# Patient Record
Sex: Female | Born: 1945
Health system: Southern US, Community
[De-identification: ages and names within clinical notes are randomized; demographics above are authoritative.]

## PROBLEM LIST (undated history)

## (undated) DIAGNOSIS — N182 Chronic kidney disease, stage 2 (mild): Secondary | ICD-10-CM

## (undated) DIAGNOSIS — I509 Heart failure, unspecified: Secondary | ICD-10-CM

## (undated) DIAGNOSIS — M810 Age-related osteoporosis without current pathological fracture: Secondary | ICD-10-CM

## (undated) DIAGNOSIS — E785 Hyperlipidemia, unspecified: Secondary | ICD-10-CM

## (undated) DIAGNOSIS — M1990 Unspecified osteoarthritis, unspecified site: Secondary | ICD-10-CM

## (undated) DIAGNOSIS — R809 Proteinuria, unspecified: Secondary | ICD-10-CM

## (undated) DIAGNOSIS — H269 Unspecified cataract: Secondary | ICD-10-CM

## (undated) DIAGNOSIS — I10 Essential (primary) hypertension: Secondary | ICD-10-CM

## (undated) DIAGNOSIS — E119 Type 2 diabetes mellitus without complications: Secondary | ICD-10-CM

## (undated) HISTORY — DX: Essential (primary) hypertension: I10

## (undated) HISTORY — DX: Unspecified cataract: H26.9

## (undated) HISTORY — DX: Heart failure, unspecified: I50.9

## (undated) HISTORY — DX: Hyperlipidemia, unspecified: E78.5

## (undated) HISTORY — DX: Morbid (severe) obesity due to excess calories: E66.01

## (undated) HISTORY — PX: TONSILLECTOMY: SUR1361

## (undated) HISTORY — DX: Unspecified osteoarthritis, unspecified site: M19.90

## (undated) HISTORY — DX: Age-related osteoporosis without current pathological fracture: M81.0

## (undated) HISTORY — DX: Proteinuria, unspecified: R80.9

## (undated) HISTORY — DX: Chronic kidney disease, stage 2 (mild): N18.2

## (undated) HISTORY — DX: Type 2 diabetes mellitus without complications: E11.9

---

## 2005-03-12 ENCOUNTER — Ambulatory Visit: Payer: Self-pay | Admitting: Family Medicine

## 2005-10-05 ENCOUNTER — Ambulatory Visit: Payer: Self-pay | Admitting: Family Medicine

## 2005-10-15 HISTORY — PX: DILATION AND CURETTAGE OF UTERUS: SHX78

## 2005-10-15 HISTORY — PX: MOUTH SURGERY: SHX715

## 2005-11-08 ENCOUNTER — Ambulatory Visit: Payer: Self-pay | Admitting: Obstetrics and Gynecology

## 2013-05-28 ENCOUNTER — Ambulatory Visit: Payer: Self-pay | Admitting: Family Medicine

## 2013-06-03 ENCOUNTER — Ambulatory Visit: Payer: Self-pay | Admitting: Family Medicine

## 2013-10-05 ENCOUNTER — Ambulatory Visit: Payer: Self-pay | Admitting: Family Medicine

## 2013-10-15 ENCOUNTER — Ambulatory Visit: Payer: Self-pay | Admitting: Family Medicine

## 2013-10-15 DIAGNOSIS — R809 Proteinuria, unspecified: Secondary | ICD-10-CM

## 2013-10-15 HISTORY — DX: Proteinuria, unspecified: R80.9

## 2013-11-15 ENCOUNTER — Ambulatory Visit: Payer: Self-pay | Admitting: Family Medicine

## 2013-12-13 ENCOUNTER — Ambulatory Visit: Payer: Self-pay | Admitting: Family Medicine

## 2013-12-13 DIAGNOSIS — M810 Age-related osteoporosis without current pathological fracture: Secondary | ICD-10-CM

## 2013-12-13 HISTORY — DX: Age-related osteoporosis without current pathological fracture: M81.0

## 2013-12-25 ENCOUNTER — Ambulatory Visit: Payer: Self-pay | Admitting: Family Medicine

## 2014-01-13 ENCOUNTER — Ambulatory Visit: Payer: Self-pay | Admitting: Family Medicine

## 2014-01-21 ENCOUNTER — Ambulatory Visit: Payer: Self-pay | Admitting: Family Medicine

## 2014-06-11 ENCOUNTER — Other Ambulatory Visit: Payer: Self-pay | Admitting: *Deleted

## 2014-06-11 ENCOUNTER — Ambulatory Visit: Payer: Self-pay

## 2014-06-11 ENCOUNTER — Encounter: Payer: Self-pay | Admitting: Podiatry

## 2014-06-11 ENCOUNTER — Ambulatory Visit (INDEPENDENT_AMBULATORY_CARE_PROVIDER_SITE_OTHER): Payer: Medicare Other | Admitting: Podiatry

## 2014-06-11 ENCOUNTER — Ambulatory Visit (INDEPENDENT_AMBULATORY_CARE_PROVIDER_SITE_OTHER): Payer: Medicare Other

## 2014-06-11 VITALS — BP 141/80 | HR 81 | Resp 16 | Ht 64.0 in | Wt 285.0 lb

## 2014-06-11 DIAGNOSIS — Q6650 Congenital pes planus, unspecified foot: Secondary | ICD-10-CM

## 2014-06-11 DIAGNOSIS — M722 Plantar fascial fibromatosis: Secondary | ICD-10-CM

## 2014-06-11 DIAGNOSIS — M7750 Other enthesopathy of unspecified foot: Secondary | ICD-10-CM

## 2014-06-11 DIAGNOSIS — Q6651 Congenital pes planus, right foot: Secondary | ICD-10-CM

## 2014-06-11 MED ORDER — TRIAMCINOLONE ACETONIDE 10 MG/ML IJ SUSP
10.0000 mg | Freq: Once | INTRAMUSCULAR | Status: AC
  Administered 2014-06-11: 10 mg

## 2014-06-11 NOTE — Patient Instructions (Signed)

## 2014-06-11 NOTE — Progress Notes (Signed)
   Subjective:    Patient ID: Julie [name], female    DOB: 1946/02/03, 68 y.o.   MRN: [id number]  HPI Comments: Been having right foot pain. It is real sore and tender. Right arch to heel pain . It has been about a month . I am diabetic and the last a1c was a 9 but it is coming down.  Foot Pain      Review of Systems  HENT:       Ringing in ears   Endocrine:       Diabetes   All other systems reviewed and are negative.      Objective:   Physical Exam        Assessment & Plan:

## 2014-06-11 NOTE — Progress Notes (Signed)
Subjective:     Patient ID: Julie [name], female   DOB: 1946/09/29, 68 y.o.   MRN: [id number]  Foot Pain   patient presents stating I'm having a lot of pain on the inside of my right foot with the pain seemed to be in the heel and also on the inside of the foot. Patient is quite obese and is having a lot of pain also in the hip and knees and it does run a relatively high A1c of between 8 and 9   Review of Systems  All other systems reviewed and are negative.      Objective:   Physical Exam  Nursing note and vitals reviewed. Constitutional: She is oriented to person, place, and time.  Cardiovascular: Intact distal pulses.   Musculoskeletal: Normal range of motion.  Neurological: She is oriented to person, place, and time.  Skin: Skin is warm and dry.   neurovascular status is found to be intact with muscle strength adequate and range of motion of the subtalar and midtarsal joint within normal limits. Patient is noted to have moderate varicosities in the ankle region she does have good digital perfusion and is well oriented x3. I noted discomfort that is mostly at the insertion of the posterior tibial tendon into the navicular with moderate plantar fascially pain in the arch itself    Assessment:     Probable posterior tibial tendinitis right with mild plantar fasciitis also noted    Plan:     Patient is noted to have normal H&P and x-rays were reviewed. I carefully injected the insertion of posterior tibial tendinitis into the navicular with 3 mg Kenalog 5 mg Xylocaine and applied fascially brace with instructions on usage. Reappoint in 1 week for consideration of orthotics

## 2014-06-18 ENCOUNTER — Ambulatory Visit (INDEPENDENT_AMBULATORY_CARE_PROVIDER_SITE_OTHER): Payer: Medicare Other | Admitting: Podiatry

## 2014-06-18 VITALS — BP 148/83 | HR 85 | Resp 16

## 2014-06-18 DIAGNOSIS — M7750 Other enthesopathy of unspecified foot: Secondary | ICD-10-CM

## 2014-06-18 MED ORDER — TRIAMCINOLONE ACETONIDE 10 MG/ML IJ SUSP: 10.0000 mg | Freq: Once | INTRAMUSCULAR | Status: DC

## 2014-06-18 NOTE — Progress Notes (Signed)
Subjective:     Patient ID: Julie [name], female   DOB: Sep 26, 1946, 68 y.o.   MRN: [id number]  HPI patient states that my heel is improved but still hurts in this one area   Review of Systems     Objective:   Physical Exam Neurovascular status intact with continued discomfort in the medial fascially band at the insertion of the tendon into the calcaneus right foot with improvement upon palpation but still tender    Assessment:     Improve plantar fasciitis with pain still noted upon deep palpation    Plan:     Today I reinjected the plantar fascia 3 mg Kenalog 5 mg Xylocaine and instructed on continued night splint usage

## 2014-08-02 ENCOUNTER — Ambulatory Visit: Payer: Self-pay | Admitting: Family Medicine

## 2014-09-03 ENCOUNTER — Ambulatory Visit (INDEPENDENT_AMBULATORY_CARE_PROVIDER_SITE_OTHER): Payer: Medicare Other | Admitting: Podiatry

## 2014-09-03 DIAGNOSIS — M722 Plantar fascial fibromatosis: Secondary | ICD-10-CM

## 2014-09-03 DIAGNOSIS — L600 Ingrowing nail: Secondary | ICD-10-CM

## 2014-09-03 NOTE — Patient Instructions (Signed)

## 2014-09-04 NOTE — Progress Notes (Signed)
Subjective:     Patient ID: Julie [name], female   DOB: May 09, 1946, 68 y.o.   MRN: [id number]  HPI patient presents with a painful right hallux nail with damaged and thickened and also still has some symptoms with the plantar fascia that becomes discomforting with ambulation   Review of Systems  All other systems reviewed and are negative.      Objective:   Physical Exam  Constitutional: She is oriented to person, place, and time.  Cardiovascular: Intact distal pulses.   Musculoskeletal: Normal range of motion.  Neurological: She is oriented to person, place, and time.  Skin: Skin is warm.  Nursing note and vitals reviewed.  neurovascular status found to be intact with muscle strength adequate and range of motion of the subtalar and midtarsal joint within normal limits. Patient is noted to have a damage right hallux nail that is thick loose and painful when pressed from a dorsal direction and has moderate discomfort in the plantar fascia of both feet     Assessment:     Damage right hallux nail with pain and deformity of several years duration and plantar fasciitis    Plan:     Reviewed both conditions and discussed physical therapy for the plantar fascia and I wrote out sheets to discuss different types of exercises to do. The nail I recommended removal and I explained permanent procedure to remove the nailbed and explained the risk of the procedure. Patient wants the surgery and today I infiltrated 60 mg Xylocaine Marcaine mixture remove the hallux nail exposed the matrix and applied phenol 3 applications 30 seconds followed by alcohol lavaged and sterile dressings. Gave instructions on soaks and reappoint

## 2015-03-31 ENCOUNTER — Other Ambulatory Visit: Payer: Self-pay | Admitting: Family Medicine

## 2015-04-01 ENCOUNTER — Other Ambulatory Visit: Payer: Self-pay

## 2015-04-01 ENCOUNTER — Telehealth: Payer: Self-pay | Admitting: Family Medicine

## 2015-04-01 MED ORDER — CANAGLIFLOZIN 100 MG PO TABS: 100.0000 mg | ORAL_TABLET | Freq: Every day | ORAL | Status: DC

## 2015-04-01 MED ORDER — LISINOPRIL 40 MG PO TABS: 40.0000 mg | ORAL_TABLET | Freq: Every day | ORAL | Status: DC

## 2015-04-01 NOTE — Telephone Encounter (Signed)
Need 90 day supply for mail order.

## 2015-04-01 NOTE — Telephone Encounter (Signed)
E-Fax came through for refill: Rx: Invokana & Lisinopril Rx fax in basket next to me

## 2015-04-05 ENCOUNTER — Other Ambulatory Visit: Payer: Self-pay | Admitting: Family Medicine

## 2015-04-05 NOTE — Telephone Encounter (Signed)
Routing to provider. Needs 90 day supply for mail order.

## 2015-04-05 NOTE — Telephone Encounter (Signed)
E-Fax came in for refill: Rx: amLODipine (NORVASC) 10 MG tablet Rx copy in basket

## 2015-04-06 MED ORDER — AMLODIPINE BESYLATE 10 MG PO TABS: 10.0000 mg | ORAL_TABLET | Freq: Every day | ORAL | Status: DC

## 2015-04-12 DIAGNOSIS — E785 Hyperlipidemia, unspecified: Secondary | ICD-10-CM | POA: Insufficient documentation

## 2015-04-12 DIAGNOSIS — M810 Age-related osteoporosis without current pathological fracture: Secondary | ICD-10-CM | POA: Insufficient documentation

## 2015-04-12 DIAGNOSIS — N182 Chronic kidney disease, stage 2 (mild): Secondary | ICD-10-CM | POA: Insufficient documentation

## 2015-04-12 DIAGNOSIS — E113599 Type 2 diabetes mellitus with proliferative diabetic retinopathy without macular edema, unspecified eye: Secondary | ICD-10-CM | POA: Insufficient documentation

## 2015-04-12 DIAGNOSIS — I10 Essential (primary) hypertension: Secondary | ICD-10-CM | POA: Insufficient documentation

## 2015-04-19 ENCOUNTER — Ambulatory Visit: Payer: Self-pay | Admitting: Family Medicine

## 2015-04-27 ENCOUNTER — Ambulatory Visit (INDEPENDENT_AMBULATORY_CARE_PROVIDER_SITE_OTHER): Payer: Medicare Other | Admitting: Family Medicine

## 2015-04-27 ENCOUNTER — Encounter: Payer: Self-pay | Admitting: Family Medicine

## 2015-04-27 VITALS — BP 130/75 | HR 90 | Temp 98.2°F | Ht 64.0 in | Wt 283.0 lb

## 2015-04-27 DIAGNOSIS — I10 Essential (primary) hypertension: Secondary | ICD-10-CM

## 2015-04-27 DIAGNOSIS — E119 Type 2 diabetes mellitus without complications: Secondary | ICD-10-CM

## 2015-04-27 DIAGNOSIS — N182 Chronic kidney disease, stage 2 (mild): Secondary | ICD-10-CM

## 2015-04-27 DIAGNOSIS — E785 Hyperlipidemia, unspecified: Secondary | ICD-10-CM

## 2015-04-27 DIAGNOSIS — R809 Proteinuria, unspecified: Secondary | ICD-10-CM

## 2015-04-27 DIAGNOSIS — R143 Flatulence: Secondary | ICD-10-CM | POA: Diagnosis not present

## 2015-04-27 DIAGNOSIS — Z5181 Encounter for therapeutic drug level monitoring: Secondary | ICD-10-CM

## 2015-04-27 MED ORDER — ATORVASTATIN CALCIUM 10 MG PO TABS: 10.0000 mg | ORAL_TABLET | Freq: Every day | ORAL | Status: DC

## 2015-04-27 NOTE — Assessment & Plan Note (Signed)
Well controlled. -continue current meds  

## 2015-04-27 NOTE — Assessment & Plan Note (Signed)
Increase statin to daily but go back down and call me if myalgias; limit eggs and saturated fats

## 2015-04-27 NOTE — Progress Notes (Signed)
BP 130/75 mmHg  Pulse 90  Temp(Src) 98.2 F (36.8 C)  Ht 5\' 4"  (1.626 m)  Wt 283 lb (128.368 kg)  BMI 48.55 kg/m2  SpO2 93%   Subjective:    Patient ID: Julie [name], female    DOB: 12-Dec-1945, 69 y.o.   MRN: [id number]  HPI: Julie [name] is a 69 y.o. female  Chief Complaint  Patient presents with  . Diabetes  . Hyperlipidemia  . Hypertension   Diabetes mellitus type 2 Patient has had diabetes for year Taking the metformin; did not get the other medicine because it was $400 She does not eat like she should Checking sugars?  yes How often? At night, 2-3 hours after meal Range (low to high) over last two weeks:  180-204 Feels diabetes is under fair control Does patient feel additional teaching/training would be helpful?  no, has gone to diabetic teaching Trying to limit white bread, white rice, white potatoes, sweets?  yes but does have occasional cake, occasional sweets Trying to limit sweetened drinks like iced tea, soft drinks, sports drinks, fruit juices?  yes; uses splenda Exercise/activity level:  sedentary (essential no activity) Checking feet every day/night?  yes Last eye exam:  A few weeks ago Diabetes-associated complications:  retinopathy; but then patient said the eye doctor said the hemorrhages are not from her diabetes, so we'll check those notes  High cholesterol Has been taking fish oil, taking four days per week right now but is okay with going to every day after this Patient has had high cholesterol for many years Dietary intake: tomato with breakfast instead of hash browns Do you eat more than 3 eggs per week  no Do you try to limit saturated fats in your diet?  yes Exercise: Exercise/activity level:  sedentary (essential NO activity) Does high cholesterol run in your family?   no Weight:  If overweight or obese, are you trying to lose weight?  highest weight was 337 pounds; goes to [employer], work on it, encourage no quack diets  High blood  pressure; normal today  Relevant past medical, surgical, family and social history reviewed and updated as indicated. Interim medical history since our last visit reviewed. Allergies and medications reviewed and updated.  Review of Systems Per HPI unless specifically indicated above     Objective:    BP 130/75 mmHg  Pulse 90  Temp(Src) 98.2 F (36.8 C)  Ht 5\' 4"  (1.626 m)  Wt 283 lb (128.368 kg)  BMI 48.55 kg/m2  SpO2 93%  Wt Readings from Last 3 Encounters:  04/27/15 283 lb (128.368 kg)  01/12/15 283 lb (128.368 kg)  06/11/14 285 lb (129.275 kg)    Physical Exam  Constitutional: She appears well-developed and well-nourished. No distress.  Morbidly obese  HENT:  Head: Normocephalic and atraumatic.  Eyes: EOM are normal. No scleral icterus.  Neck: No thyromegaly present.  No carotid bruits  Cardiovascular: Normal rate, regular rhythm and normal heart sounds.   No murmur heard. Pulmonary/Chest: Effort normal and breath sounds normal. No respiratory distress. She has no wheezes.  Abdominal: Soft. Bowel sounds are normal. She exhibits no distension. There is no tenderness. There is no guarding.  Musculoskeletal: Normal range of motion. She exhibits no edema.  Neurological: She is alert. She exhibits normal muscle tone.  Skin: Skin is warm and dry. She is not diaphoretic. No pallor.  Psychiatric: She has a normal mood and affect. Her behavior is normal. Judgment and thought content normal.  Diabetic Foot Form - Detailed   Diabetic Foot Exam - detailed  Diabetic Foot exam was performed with the following findings:  Yes 04/27/2015 11:03 AM  Visual Foot Exam completed.:  Yes  Is there a history of foot ulcer?:  No  Can the patient see the bottom of their feet?:  Yes  Are the toenails thick?:  Yes  Are the toenails ingrown?:  No    Pulse Foot Exam completed.:  Yes  Right Dorsalis Pedis:  Present Left Dorsalis Pedis:  Present  Sensory Foot Exam Completed.:  Yes  Swelling:   No  Semmes-Weinstein Monofilament Test  R Site 1-Great Toe:  Pos L Site 1-Great Toe:  Pos  R Site 4:  Pos L Site 4:  Pos    Comments:  Intact to monofilament and vibration testing; skin dry at the heels; some toenail fungus      No results found for this or any previous visit.    Assessment & Plan:   Problem List Items Addressed This Visit      Cardiovascular and Mediastinum   Hypertension    Well-controlled; continue current meds      Relevant Medications   atorvastatin (LIPITOR) 10 MG tablet     Endocrine   Diabetes mellitus without complication - Primary    Check A1c today; tradjenta was too expensive; continue other med; limit sweets      Relevant Medications   atorvastatin (LIPITOR) 10 MG tablet   Other Relevant Orders   Hgb A1c w/o eAG   Microalbumin / creatinine urine ratio     Genitourinary   CKD (chronic kidney disease) stage 2, GFR 60-89 ml/min    Monitor creatinine and GFR today      Relevant Orders   Microalbumin / creatinine urine ratio     Other   Hyperlipidemia    Increase statin to daily but go back down and call me if myalgias; limit eggs and saturated fats      Relevant Medications   atorvastatin (LIPITOR) 10 MG tablet   Other Relevant Orders   Lipid Panel w/o Chol/HDL Ratio   Proteinuria    Has seen Dr. Holley Raring twice; will check urine and creatinine today; avoid NSAIDs      Relevant Orders   Microalbumin / creatinine urine ratio    Other Visit Diagnoses    Flatulence        she has never tried Gas-x; limit beans, bagels, broccoli, cabbage, etc.    Medication monitoring encounter        Relevant Orders    Comprehensive metabolic panel       An After Visit Summary was printed and given to the patient. Orders Placed This Encounter  Procedures  . Comprehensive metabolic panel    Order Specific Question:  Has the patient fasted?    Answer:  Yes  . Hgb A1c w/o eAG  . Lipid Panel w/o Chol/HDL Ratio    Order Specific Question:  Has  the patient fasted?    Answer:  Yes  . Microalbumin / creatinine urine ratio   Medications Discontinued During This Encounter  Medication Reason  . linagliptin (TRADJENTA) 5 MG TABS tablet Not covered by the pt's insurance  . atorvastatin (LIPITOR) 10 MG tablet Reorder    Follow up plan: Return in about 3 months (around 07/28/2015) for diabetes, high cholesterol.

## 2015-04-27 NOTE — Assessment & Plan Note (Signed)
Has seen Dr. Holley Raring twice; will check urine and creatinine today; avoid NSAIDs

## 2015-04-27 NOTE — Assessment & Plan Note (Signed)
Monitor creatinine and GFR today

## 2015-04-27 NOTE — Patient Instructions (Addendum)
Limit beans, bagels, broccoli, bread, cabbage Try Gas-X for symptoms Keep up efforts at weight loss Avoid NSAIDs Return in 3 months for follow-up

## 2015-04-27 NOTE — Assessment & Plan Note (Signed)
Check A1c today; tradjenta was too expensive; continue other med; limit sweets

## 2015-04-28 ENCOUNTER — Encounter: Payer: Self-pay | Admitting: Family Medicine

## 2015-04-28 LAB — COMPREHENSIVE METABOLIC PANEL
A/G RATIO: 1.6 (ref 1.1–2.5)
ALT: 15 IU/L (ref 0–32)
AST: 12 IU/L (ref 0–40)
Albumin: 3.9 g/dL (ref 3.6–4.8)
Alkaline Phosphatase: 86 IU/L (ref 39–117)
BUN/Creatinine Ratio: 22 (ref 11–26)
BUN: 19 mg/dL (ref 8–27)
Bilirubin Total: 0.4 mg/dL (ref 0.0–1.2)
CO2: 23 mmol/L (ref 18–29)
CREATININE: 0.85 mg/dL (ref 0.57–1.00)
Calcium: 9.4 mg/dL (ref 8.7–10.3)
Chloride: 102 mmol/L (ref 97–108)
GFR calc non Af Amer: 72 mL/min/{1.73_m2} (ref >59)
GFR, EST AFRICAN AMERICAN: 83 mL/min/{1.73_m2} (ref >59)
Globulin, Total: 2.4 g/dL (ref 1.5–4.5)
Glucose: 152 mg/dL — ABNORMAL HIGH (ref 65–99)
Potassium: 4.1 mmol/L (ref 3.5–5.2)
SODIUM: 142 mmol/L (ref 134–144)
Total Protein: 6.3 g/dL (ref 6.0–8.5)

## 2015-04-28 LAB — LIPID PANEL W/O CHOL/HDL RATIO
Cholesterol, Total: 156 mg/dL (ref 100–199)
HDL: 36 mg/dL — ABNORMAL LOW (ref >39)
LDL Calculated: 99 mg/dL (ref 0–99)
Triglycerides: 104 mg/dL (ref 0–149)
VLDL CHOLESTEROL CAL: 21 mg/dL (ref 5–40)

## 2015-04-28 LAB — HGB A1C W/O EAG: Hgb A1c MFr Bld: 8.3 % — ABNORMAL HIGH (ref 4.8–5.6)

## 2015-04-29 LAB — MICROALBUMIN / CREATININE URINE RATIO
Creatinine, Urine: 53.3 mg/dL
MICROALB/CREAT RATIO: 84.6 mg/g creat — ABNORMAL HIGH (ref 0.0–30.0)
Microalbumin, Urine: 45.1 ug/mL

## 2015-05-16 ENCOUNTER — Other Ambulatory Visit: Payer: Self-pay | Admitting: Family Medicine

## 2015-05-16 NOTE — Telephone Encounter (Signed)
Routing to provider  

## 2015-05-20 LAB — HM DIABETES EYE EXAM

## 2015-06-25 ENCOUNTER — Other Ambulatory Visit: Payer: Self-pay | Admitting: Family Medicine

## 2015-07-28 ENCOUNTER — Encounter: Payer: Self-pay | Admitting: Family Medicine

## 2015-07-28 ENCOUNTER — Ambulatory Visit (INDEPENDENT_AMBULATORY_CARE_PROVIDER_SITE_OTHER): Payer: Medicare Other | Admitting: Family Medicine

## 2015-07-28 VITALS — BP 133/87 | HR 82 | Temp 97.5°F | Ht 64.0 in | Wt 280.0 lb

## 2015-07-28 DIAGNOSIS — R809 Proteinuria, unspecified: Secondary | ICD-10-CM

## 2015-07-28 DIAGNOSIS — R011 Cardiac murmur, unspecified: Secondary | ICD-10-CM

## 2015-07-28 DIAGNOSIS — I10 Essential (primary) hypertension: Secondary | ICD-10-CM

## 2015-07-28 DIAGNOSIS — N182 Chronic kidney disease, stage 2 (mild): Secondary | ICD-10-CM

## 2015-07-28 DIAGNOSIS — E113513 Type 2 diabetes mellitus with proliferative diabetic retinopathy with macular edema, bilateral: Secondary | ICD-10-CM

## 2015-07-28 DIAGNOSIS — E785 Hyperlipidemia, unspecified: Secondary | ICD-10-CM | POA: Diagnosis not present

## 2015-07-28 DIAGNOSIS — Z5181 Encounter for therapeutic drug level monitoring: Secondary | ICD-10-CM

## 2015-07-28 DIAGNOSIS — Z23 Encounter for immunization: Secondary | ICD-10-CM

## 2015-07-28 NOTE — Patient Instructions (Addendum)
Try to use PLAIN allergy medicine without the decongestant Avoid: phenylephrine, phenylpropanolamine, and pseudoephredine Your goal blood pressure is less than 140 mmHg on top. Try to follow the DASH guidelines (DASH stands for Dietary Approaches to Stop Hypertension) Try to limit the sodium in your diet.  Ideally, consume less than 1.5 grams (less than 1,500mg ) per day. Do not add salt when cooking or at the table.  Check the sodium amount on labels when shopping, and choose items lower in sodium when given a choice. Avoid or limit foods that already contain a lot of sodium. Eat a diet rich in fruits and vegetables and whole grains. Check out the information at familydoctor.org entitled "What It Takes to Lose Weight" Try to lose between 1-2 pounds per week by taking in fewer calories and burning off more calories You can succeed by limiting portions, limiting foods dense in calories and fat, becoming more active, and drinking 8 glasses of water a day (64 ounces) Don't skip meals, especially breakfast, as skipping meals may alter your metabolism Do not use over-the-counter weight loss pills or gimmicks that claim rapid weight loss A healthy BMI (or body mass index) is between 18.5 and 24.9 You can calculate your ideal BMI at the [place] website ClubMonetize.fr To get out of the "morbid obesity" category, you will want to reach a weight of 232 pounds To get out of the "obesity" category, you will want to reach a weight of 174 pounds Try to do this gradually over the coming 12-18 months You received the flu shot today; it should protect you against the flu virus over the coming months; it will take about two weeks for antibodies to develop; do try to stay away from hospitals, nursing homes, and daycares during peak flu season; taking 1000 mg of vitamin C daily during flu season may help you avoid getting sick   DASH Eating Plan DASH stands for  "Dietary Approaches to Stop Hypertension." The DASH eating plan is a healthy eating plan that has been shown to reduce high blood pressure (hypertension). Additional health benefits may include reducing the risk of type 2 diabetes mellitus, heart disease, and stroke. The DASH eating plan may also help with weight loss. WHAT DO I NEED TO KNOW ABOUT THE DASH EATING PLAN? For the DASH eating plan, you will follow these general guidelines:  Choose foods with a percent daily value for sodium of less than 5% (as listed on the food label).  Use salt-free seasonings or herbs instead of table salt or sea salt.  Check with your health care provider or pharmacist before using salt substitutes.  Eat lower-sodium products, often labeled as "lower sodium" or "no salt added."  Eat fresh foods.  Eat more vegetables, fruits, and low-fat dairy products.  Choose whole grains. Look for the word "whole" as the first word in the ingredient list.  Choose fish and skinless chicken or [place] more often than red meat. Limit fish, poultry, and meat to 6 oz (170 g) each day.  Limit sweets, desserts, sugars, and sugary drinks.  Choose heart-healthy fats.  Limit cheese to 1 oz (28 g) per day.  Eat more home-cooked food and less restaurant, buffet, and fast food.  Limit fried foods.  Cook foods using methods other than frying.  Limit canned vegetables. If you do use them, rinse them well to decrease the sodium.  When eating at a restaurant, ask that your food be prepared with less salt, or no salt if possible. WHAT FOODS  CAN I EAT? Seek help from a dietitian for individual calorie needs. Grains Whole grain or whole wheat bread. Brown rice. Whole grain or whole wheat pasta. Quinoa, bulgur, and whole grain cereals. Low-sodium cereals. Corn or whole wheat flour tortillas. Whole grain cornbread. Whole grain crackers. Low-sodium crackers. Vegetables Fresh or frozen vegetables (raw, steamed, roasted, or grilled).  Low-sodium or reduced-sodium tomato and vegetable juices. Low-sodium or reduced-sodium tomato sauce and paste. Low-sodium or reduced-sodium canned vegetables.  Fruits All fresh, canned (in natural juice), or frozen fruits. Meat and Other Protein Products Ground beef (85% or leaner), grass-fed beef, or beef trimmed of fat. Skinless chicken or [place]. Ground chicken or [place]. Pork trimmed of fat. All fish and seafood. Eggs. Dried beans, peas, or lentils. Unsalted nuts and seeds. Unsalted canned beans. Dairy Low-fat dairy products, such as skim or 1% milk, 2% or reduced-fat cheeses, low-fat ricotta or cottage cheese, or plain low-fat yogurt. Low-sodium or reduced-sodium cheeses. Fats and Oils Tub margarines without trans fats. Light or reduced-fat mayonnaise and salad dressings (reduced sodium). Avocado. Safflower, olive, or canola oils. Natural peanut or almond butter. Other Unsalted popcorn and pretzels. The items listed above may not be a complete list of recommended foods or beverages. Contact your dietitian for more options. WHAT FOODS ARE NOT RECOMMENDED? Grains White bread. White pasta. White rice. Refined cornbread. Bagels and croissants. Crackers that contain trans fat. Vegetables Creamed or fried vegetables. Vegetables in a cheese sauce. Regular canned vegetables. Regular canned tomato sauce and paste. Regular tomato and vegetable juices. Fruits Dried fruits. Canned fruit in light or heavy syrup. Fruit juice. Meat and Other Protein Products Fatty cuts of meat. Ribs, chicken wings, bacon, sausage, bologna, salami, chitterlings, fatback, hot dogs, bratwurst, and packaged luncheon meats. Salted nuts and seeds. Canned beans with salt. Dairy Whole or 2% milk, cream, half-and-half, and cream cheese. Whole-fat or sweetened yogurt. Full-fat cheeses or blue cheese. Nondairy creamers and whipped toppings. Processed cheese, cheese spreads, or cheese curds. Condiments Onion and garlic salt,  seasoned salt, table salt, and sea salt. Canned and packaged gravies. Worcestershire sauce. Tartar sauce. Barbecue sauce. Teriyaki sauce. Soy sauce, including reduced sodium. Steak sauce. Fish sauce. Oyster sauce. Cocktail sauce. Horseradish. Ketchup and mustard. Meat flavorings and tenderizers. Bouillon cubes. Hot sauce. Tabasco sauce. Marinades. Taco seasonings. Relishes. Fats and Oils Butter, stick margarine, lard, shortening, ghee, and bacon fat. Coconut, palm kernel, or palm oils. Regular salad dressings. Other Pickles and olives. Salted popcorn and pretzels. The items listed above may not be a complete list of foods and beverages to avoid. Contact your dietitian for more information. WHERE CAN I FIND MORE INFORMATION? National Heart, Lung, and Blood Institute: travelstabloid.com   This information is not intended to replace advice given to you by your health care provider. Make sure you discuss any questions you have with your health care provider.   Document Released: 09/20/2011 Document Revised: 10/22/2014 Document Reviewed: 08/05/2013 Elsevier Interactive Patient Education [employer].

## 2015-07-28 NOTE — Assessment & Plan Note (Signed)
Keep working on [employer]; offered counseling in case there is emotional eating; she declined; I explained that losing significant weight is the single most important thing she can do for her health; she understands

## 2015-07-28 NOTE — Assessment & Plan Note (Signed)
Limit saturated fats; get more fiber

## 2015-07-28 NOTE — Assessment & Plan Note (Signed)
Seen by cardiologist, Dr. Ubaldo Glassing

## 2015-07-28 NOTE — Assessment & Plan Note (Signed)
Staying away from [facility]; saw nephrologist; work on control of diabetes and hypertension

## 2015-07-28 NOTE — Assessment & Plan Note (Signed)
She will avoid NSAIDs; work on control of sugars and pressures; continue ACE-I

## 2015-07-28 NOTE — Assessment & Plan Note (Signed)
She completed diabetic education; working on weight loss; check feet every night; seeing eye doctor regularly for diabetic complications; check U2G; limit sweets; foot exam by MD today

## 2015-07-28 NOTE — Progress Notes (Signed)
BP 133/87 mmHg  Pulse 82  Temp(Src) 97.5 F (36.4 C)  Ht 5\' 4"  (1.626 m)  Wt 280 lb (127.007 kg)  BMI 48.04 kg/m2  SpO2 96%   Subjective:    Patient ID: Julie [name], female    DOB: 16-Sep-1946, 69 y.o.   MRN: [id number]  HPI: Julie [name] is a 69 y.o. female  Chief Complaint  Patient presents with  . Hypertension  . Hyperlipidemia  . Diabetes   Diabetes Checking sugars at home, 209 post-prandial last one she remembers; not checking regularly She has diabetic retinopathy, hemorrhages, seeing eye doctor Toenails are long, but no other problems with feet No chest pain; no shortness of breath Limiting sugary drinks; decaf unsweetened tea; no soft drinks Ran out of Invokana, $550 a month, so she has been taking every other day for about 2 weeks She is not overly opposed to starting insulin if needed  Hypertension Patient has had hypertension for years Saw Dr. Holley Raring for her kidneys, avoiding NSAID Hypertension-associated complications:  none If taking medicines, are you taking them regularly?  yes Siblings / family history: Does high blood pressure run in your family?   no Salt:  Trying to limit sodium / salt when buying foods at the grocery store?  yes (buying popcorn, low sodium veggies) Do you try to limit added salt when cooking and at the table?  NO Sweets/licorice:  Do you eat a lot of sweets or eat black licorice?  YES (but does not eat black licorice) Saturated fats: Do you eat a lot of foods like bacon, sausage, pepperoni, cheeseburgers, hot dogs, bologna, and cheese?  YES , bacon once or twice a week; BLT every 2-3 months; not much cheese; does eat hot dogs Sedentary lifestyle:  Exercise/activity level:  seldom (a few times a month), right knee hurts so Steroids/Non-steroidals:  Have you had a recent cortisone shot in the last few months?  no but may get one in the near future, either cortisone or rooster comb Do you take prednisone or prescription NSAIDs  or take OTCS NSAIDs such as ibuprofen, Motrin, Advil, Aleve, or naproxen? no   Smoking: Do you smoke?  no  Snoring / sleep apnea: Do you snore or have sleep apnea?  YES, snores if she lays on her back, not much at night on her side but no sleep apnea  Stroh's (alcohol): Do you drink alcohol  no Sudafed (decongestants): Do you use any OTC decongestant products like Allegra-D, Claritin-D, Zyrtec-D, Tylenol Cold and Sinus, etc.?  YES just sometimes in the winter with bad cold  High cholesterol Patient has had high cholesterol for years Dietary intake: Do you eat more than 3 eggs per week  YES but has been trying to get more egg whites Do you try to limit saturated fats in your diet?  yes Exercise: Weight:  If overweight or obese, are you trying to lose weight?  yes  Had injection in her left eye yesterday, for diabetes, hemorrhage, getting shots from her eye doctor Saw Dr. Marry Guan about right knee pain  Relevant past medical, surgical, family and social history reviewed and updated as indicated. Interim medical history since our last visit reviewed. Allergies and medications reviewed and updated.  Review of Systems Per HPI unless specifically indicated above     Objective:    BP 133/87 mmHg  Pulse 82  Temp(Src) 97.5 F (36.4 C)  Ht 5\' 4"  (1.626 m)  Wt 280 lb (127.007 kg)  BMI 48.04  kg/m2  SpO2 96%  Wt Readings from Last 3 Encounters:  07/28/15 280 lb (127.007 kg)  04/27/15 283 lb (128.368 kg)  01/12/15 283 lb (128.368 kg)    Physical Exam  Constitutional: She appears well-developed and well-nourished. No distress.  HENT:  Head: Normocephalic and atraumatic.  Eyes: EOM are normal. No scleral icterus.  Neck: No thyromegaly present.  Cardiovascular: Normal rate and regular rhythm.   Murmur heard.  Systolic murmur is present with a grade of 2/6  Pulmonary/Chest: Effort normal and breath sounds normal. No respiratory distress. She has no wheezes.  Abdominal: Soft. Bowel  sounds are normal. She exhibits no distension.  Musculoskeletal: Normal range of motion. She exhibits no edema.  Neurological: She is alert. She exhibits normal muscle tone.  Skin: Skin is warm and dry. She is not diaphoretic. No pallor.  Psychiatric: She has a normal mood and affect. Her behavior is normal. Judgment and thought content normal.   Diabetic Foot Form - Detailed   Diabetic Foot Exam - detailed  Diabetic Foot exam was performed with the following findings:  Yes 07/28/2015 10:13 AM  Visual Foot Exam completed.:  Yes  Is there a history of foot ulcer?:  No  Is there swelling or and abnormal foot shape?:  No  Are the toenails long?:  No  Are the toenails thick?:  Yes  Are the toenails ingrown?:  No    Pulse Foot Exam completed.:  Yes  Right Dorsalis Pedis:  Present Left Dorsalis Pedis:  Present  Sensory Foot Exam Completed.:  Yes  Semmes-Weinstein Monofilament Test  R Site 1-Great Toe:  Pos L Site 1-Great Toe:  Pos  R Site 4:  Pos L Site 4:  Pos  R Site 5:  Pos L Site 5:  Pos        Results for orders placed or performed in visit on 04/27/15  Comprehensive metabolic panel  Result Value Ref Range   Glucose 152 (H) 65 - 99 mg/dL   BUN 19 8 - 27 mg/dL   Creatinine, Ser 0.85 0.57 - 1.00 mg/dL   GFR calc non Af Amer 72 >59 mL/min/1.73   GFR calc Af Amer 83 >59 mL/min/1.73   BUN/Creatinine Ratio 22 11 - 26   Sodium 142 134 - 144 mmol/L   Potassium 4.1 3.5 - 5.2 mmol/L   Chloride 102 97 - 108 mmol/L   CO2 23 18 - 29 mmol/L   Calcium 9.4 8.7 - 10.3 mg/dL   Total Protein 6.3 6.0 - 8.5 g/dL   Albumin 3.9 3.6 - 4.8 g/dL   Globulin, Total 2.4 1.5 - 4.5 g/dL   Albumin/Globulin Ratio 1.6 1.1 - 2.5   Bilirubin Total 0.4 0.0 - 1.2 mg/dL   Alkaline Phosphatase 86 39 - 117 IU/L   AST 12 0 - 40 IU/L   ALT 15 0 - 32 IU/L  Hgb A1c w/o eAG  Result Value Ref Range   Hgb A1c MFr Bld 8.3 (H) 4.8 - 5.6 %  Lipid Panel w/o Chol/HDL Ratio  Result Value Ref Range   Cholesterol,  Total 156 100 - 199 mg/dL   Triglycerides 104 0 - 149 mg/dL   HDL 36 (L) >39 mg/dL   VLDL Cholesterol Cal 21 5 - 40 mg/dL   LDL Calculated 99 0 - 99 mg/dL  Microalbumin / creatinine urine ratio  Result Value Ref Range   Creatinine, Urine 53.3 Not Estab. mg/dL   Microalbum.,U,Random 45.1 Not Estab. ug/mL   MICROALB/CREAT RATIO  84.6 (H) 0.0 - 30.0 mg/g creat      Assessment & Plan:   Problem List Items Addressed This Visit      Cardiovascular and Mediastinum   Hypertension    Under fair control; limit salt and try DASH guidelines; avoid decongestants        Endocrine   Diabetes mellitus with proliferative retinopathy ([place]) - Primary    She completed diabetic education; working on weight loss; check feet every night; seeing eye doctor regularly for diabetic complications; check O0H; limit sweets; foot exam by MD today      Relevant Orders   Hgb A1c w/o eAG   Microalbumin / creatinine urine ratio     Genitourinary   CKD (chronic kidney disease) stage 2, GFR 60-89 ml/min    Staying away from NSAIds; saw nephrologist; work on control of diabetes and hypertension      Relevant Orders   Microalbumin / creatinine urine ratio     Other   Hyperlipidemia    Limit saturated fats; get more fiber      Relevant Orders   Lipid Panel w/o Chol/HDL Ratio   Morbid obesity ([place])    Keep working on weight; offered counseling in case there is emotional eating; she declined; I explained that losing significant weight is the single most important thing she can do for her health; she understands      Proteinuria    She will avoid NSAIDs; work on control of sugars and pressures; continue ACE-I      Relevant Orders   Microalbumin / creatinine urine ratio   Cardiac murmur    Seen by cardiologist, Dr. Ubaldo Glassing       Other Visit Diagnoses    Needs flu shot        Encounter for immunization        Medication monitoring encounter        Relevant Orders    Comprehensive metabolic panel         Follow up plan: Return in about 3 months (around 10/28/2015) for visit and fasting labs.  Orders Placed This Encounter  Procedures  . Flu Vaccine QUAD 36+ mos IM  . Hgb A1c w/o eAG  . Lipid Panel w/o Chol/HDL Ratio  . Microalbumin / creatinine urine ratio  . Comprehensive metabolic panel   An after-visit summary was printed and given to the patient at [place].  Please see the patient instructions which may contain other information and recommendations beyond what is mentioned above in the assessment and plan.

## 2015-07-28 NOTE — Assessment & Plan Note (Signed)
Under fair control; limit salt and try DASH guidelines; avoid decongestants

## 2015-07-29 LAB — COMPREHENSIVE METABOLIC PANEL
ALK PHOS: 82 IU/L (ref 39–117)
ALT: 17 IU/L (ref 0–32)
AST: 16 IU/L (ref 0–40)
Albumin/Globulin Ratio: 1.4 (ref 1.1–2.5)
Albumin: 3.8 g/dL (ref 3.6–4.8)
BUN/Creatinine Ratio: 22 (ref 11–26)
BUN: 14 mg/dL (ref 8–27)
Bilirubin Total: 0.4 mg/dL (ref 0.0–1.2)
CO2: 25 mmol/L (ref 18–29)
CREATININE: 0.64 mg/dL (ref 0.57–1.00)
Calcium: 9.6 mg/dL (ref 8.7–10.3)
Chloride: 99 mmol/L (ref 97–108)
GFR calc Af Amer: 107 mL/min/{1.73_m2} (ref >59)
GFR calc non Af Amer: 93 mL/min/{1.73_m2} (ref >59)
GLUCOSE: 179 mg/dL — AB (ref 65–99)
Globulin, Total: 2.7 g/dL (ref 1.5–4.5)
Potassium: 4.6 mmol/L (ref 3.5–5.2)
SODIUM: 141 mmol/L (ref 134–144)
Total Protein: 6.5 g/dL (ref 6.0–8.5)

## 2015-07-29 LAB — LIPID PANEL W/O CHOL/HDL RATIO
Cholesterol, Total: 139 mg/dL (ref 100–199)
HDL: 44 mg/dL (ref >39)
LDL Calculated: 76 mg/dL (ref 0–99)
Triglycerides: 93 mg/dL (ref 0–149)
VLDL Cholesterol Cal: 19 mg/dL (ref 5–40)

## 2015-07-29 LAB — HGB A1C W/O EAG: Hgb A1c MFr Bld: 7.8 % — ABNORMAL HIGH (ref 4.8–5.6)

## 2015-07-29 LAB — MICROALBUMIN / CREATININE URINE RATIO
CREATININE, UR: 63.6 mg/dL
MICROALB/CREAT RATIO: 134.3 mg/g{creat} — AB (ref 0.0–30.0)
MICROALBUM., U, RANDOM: 85.4 ug/mL

## 2015-08-02 ENCOUNTER — Telehealth: Payer: Self-pay

## 2015-08-02 NOTE — Telephone Encounter (Signed)
Called patient to ask if she's had her eye exam recently. She stated she has and now has to go back every month for an injection. Patient goes to [facility]. Will call the office and ask to fax those records over. Patient also told me she had to cancel her mammogram back in April due to a family emergency. She asked if there could be another appointment scheduled because [facility] hasn't called to reschedule. Note sent to [facility] about mammogram.

## 2015-08-03 ENCOUNTER — Telehealth: Payer: Self-pay

## 2015-08-03 DIAGNOSIS — R928 Other abnormal and inconclusive findings on diagnostic imaging of breast: Secondary | ICD-10-CM

## 2015-08-03 NOTE — Telephone Encounter (Signed)
Patient called and stated that she had missed her 6 month follow up mammogram. Her previous mammo was in Oct. 2015. Can we go ahead and just order a bilat diagnostic mammo?  Patient states no Thursday appt's.

## 2015-08-03 NOTE — Telephone Encounter (Signed)
Yes, that's fine, please order

## 2015-08-04 DIAGNOSIS — R928 Other abnormal and inconclusive findings on diagnostic imaging of breast: Secondary | ICD-10-CM | POA: Insufficient documentation

## 2015-08-04 NOTE — Telephone Encounter (Signed)
We talked about diet changes; A1C is better, but not to goal; she will start back on Invokana first of the year She is taking statin every day and lipids better; HDL improved; pain is gone on the new statin Keep working on weight loss Limit meats, smaller servings

## 2015-08-05 NOTE — Telephone Encounter (Signed)
Per Aldona Bar at [place], must also order bilateral u/s. Orders placed.

## 2015-08-05 NOTE — Addendum Note (Signed)
Addended byWende Mott J on: 08/05/2015 10:43 AM   Modules accepted: Orders

## 2015-08-08 NOTE — Telephone Encounter (Signed)
Bilat Dx. Mammo appt scheduled 08/24/15 at 11:20am. Patient notified.

## 2015-08-24 ENCOUNTER — Ambulatory Visit
Admission: RE | Admit: 2015-08-24 | Discharge: 2015-08-24 | Disposition: A | Discharge status: Home / Self Care | Payer: Medicare Other | Source: Ambulatory Visit | Attending: Family Medicine | Admitting: Family Medicine

## 2015-08-24 DIAGNOSIS — R928 Other abnormal and inconclusive findings on diagnostic imaging of breast: Secondary | ICD-10-CM

## 2015-08-24 DIAGNOSIS — R921 Mammographic calcification found on diagnostic imaging of breast: Secondary | ICD-10-CM | POA: Diagnosis not present

## 2015-09-12 ENCOUNTER — Other Ambulatory Visit: Payer: Self-pay | Admitting: Family Medicine

## 2015-09-12 NOTE — Telephone Encounter (Signed)
Dr Lada patient-routing to provider. 

## 2015-10-31 ENCOUNTER — Other Ambulatory Visit: Payer: Self-pay | Admitting: Family Medicine

## 2015-10-31 NOTE — Telephone Encounter (Signed)
Patient cancelled her appointment for this week I'm disappointed, but will refill meds Reviewed last labs

## 2015-11-02 ENCOUNTER — Ambulatory Visit: Payer: Medicare Other | Admitting: Family Medicine

## 2015-11-09 ENCOUNTER — Other Ambulatory Visit: Payer: Self-pay | Admitting: Family Medicine

## 2015-11-09 NOTE — Telephone Encounter (Signed)
Last labs reviewed; appt coming up; rx approved

## 2015-11-09 NOTE — Telephone Encounter (Signed)
Your patient 

## 2015-11-30 ENCOUNTER — Ambulatory Visit (INDEPENDENT_AMBULATORY_CARE_PROVIDER_SITE_OTHER): Payer: Medicare Other | Admitting: Family Medicine

## 2015-11-30 ENCOUNTER — Encounter: Payer: Self-pay | Admitting: Family Medicine

## 2015-11-30 VITALS — BP 145/82 | HR 83 | Temp 98.5°F | Ht 63.75 in | Wt 278.8 lb

## 2015-11-30 DIAGNOSIS — R809 Proteinuria, unspecified: Secondary | ICD-10-CM

## 2015-11-30 DIAGNOSIS — I8390 Asymptomatic varicose veins of unspecified lower extremity: Secondary | ICD-10-CM | POA: Insufficient documentation

## 2015-11-30 DIAGNOSIS — E785 Hyperlipidemia, unspecified: Secondary | ICD-10-CM | POA: Diagnosis not present

## 2015-11-30 DIAGNOSIS — I8393 Asymptomatic varicose veins of bilateral lower extremities: Secondary | ICD-10-CM | POA: Diagnosis not present

## 2015-11-30 DIAGNOSIS — Z5181 Encounter for therapeutic drug level monitoring: Secondary | ICD-10-CM | POA: Diagnosis not present

## 2015-11-30 DIAGNOSIS — E113513 Type 2 diabetes mellitus with proliferative diabetic retinopathy with macular edema, bilateral: Secondary | ICD-10-CM

## 2015-11-30 DIAGNOSIS — M810 Age-related osteoporosis without current pathological fracture: Secondary | ICD-10-CM | POA: Diagnosis not present

## 2015-11-30 DIAGNOSIS — L509 Urticaria, unspecified: Secondary | ICD-10-CM | POA: Diagnosis not present

## 2015-11-30 DIAGNOSIS — R928 Other abnormal and inconclusive findings on diagnostic imaging of breast: Secondary | ICD-10-CM

## 2015-11-30 LAB — MICROALBUMIN, URINE WAIVED
CREATININE, URINE WAIVED: 100 mg/dL (ref 10–300)
MICROALB, UR WAIVED: 150 mg/L — AB (ref 0–19)

## 2015-11-30 NOTE — Assessment & Plan Note (Signed)
Check urine microalbumin:creatinine

## 2015-11-30 NOTE — Patient Instructions (Addendum)
Try Dreft or Tide Free or very mild laundry detergent Avoid dryer sheets Start plain Claritin (loratidine), but AVOID the D, don't buy the one with the decongestant Okay to take benadryl at night  Try to limit saturated fats in your diet (bologna, hot dogs, barbeque, cheeseburgers, hamburgers, steak, bacon, sausage, cheese, etc.) and get more fresh fruits, vegetables, and whole grains  See your eye doctor, and have your eyes examined regular Check your feet every night and let me know right away of any sores, infections, numbness, etc. Try to limit sweets, white bread, white rice, white potatoes It is okay for you to not check your fingerstick blood sugars (per [employer] of Endocrinology Best Practices)  Your goal blood pressure is less than 140 mmHg on top. Try to follow the DASH guidelines (DASH stands for Dietary Approaches to Stop Hypertension) Try to limit the sodium in your diet.  Ideally, consume less than 1.5 grams (less than 1,500mg ) per day. Do not add salt when cooking or at the table.  Check the sodium amount on labels when shopping, and choose items lower in sodium when given a choice. Avoid or limit foods that already contain a lot of sodium. Eat a diet rich in fruits and vegetables and whole grains.  Return in 3 months if A1c is 7 or higher, or 6 months if excellent control  Keep up the great job with working on weight loss, as every little bit counts  Try horse chestnut for vein healthy; you can also try compression wrap, start distally and wrap up the leg toward the knee; if your leg becomes red or hot or painful, please go to the ER or urgent care  With your history of thin bones and fall, I'd like to recheck your bone density; please do call to schedule your bone density study; the number to schedule one at either [facility] or [facility] is [phone]  Try to get three servings of calcium a day and take 1000 iu of vitamin D3 daily;  I'll check your vitamin D level today and if you need more than 1000 iu daily, we'll let you know; practice good fall precautions (don't get up on chairs or ladders, use handrails, use cane if needed, etc.)

## 2015-11-30 NOTE — Assessment & Plan Note (Signed)
Check labs today to monitor sgpt on the statin, K+ and creatinine on the ACE-I, and electrolytes on the thiazide

## 2015-11-30 NOTE — Assessment & Plan Note (Addendum)
Check vit D; order DEXA; fall precautions, calcium, vit D

## 2015-11-30 NOTE — Progress Notes (Signed)
BP 145/82 mmHg  Pulse 83  Temp(Src) 98.5 F (36.9 C)  Ht 5' 3.75" (1.619 m)  Wt 278 lb 12.8 oz (126.463 kg)  BMI 48.25 kg/m2  SpO2 95%   Subjective:    Patient ID: Julie [name], female    DOB: 19-Jan-1946, 70 y.o.   MRN: [id number]  HPI: Julie [name] is a 70 y.o. female  Chief Complaint  Patient presents with  . Follow-up  . Labs Only  . Rash    Hive like. On arms hands, some on shoulder and chest. Does itch.   . Fall    Hurt right knee and right arm. January 20th.     She has had welts on her arms; no changes in the bath soaps; itching on hands; come up on the arms and upper back; none on the face; few on the chest; she tried lotion in case of dry skin; itch and come up raised and then go away; she takes benadryl and it helps; makes her sleepy; husband broke out and used zyrtec  She fell on January 20th; had a shot in her left eye; as having some vision problems, at the doctor's office for 2-1/2 to 3 hours; had not eaten, hurt putting it in, there forever; had a wobbly thing, looking through a prism; fell in the grocery store and right leg just buckled; she just left her cart, did not have cane or husband with her; it was raining ; bruised the right knee badly and couldn't bend it well for about a week; got abrasion of the right arm, healing up; arm and leg 95% better; still has discomfort along medial upper right calf; no redness or heat; she had seen ortho many months ago and had xrays done of the right knee, nothing major  Type 2 diabetes; not checking sugars (with my blessing); no foot problems; no chest pain, no trouble breathing; no dry mouth; good sensation in the feet; does have eye damage and is getting shots in her eyes;; six total, next in the right; steady losing weight; expects that A1c will be a little higher; was not able to refill Invokana because of $$$; not taking Invokana at all now for months; she had the positive microalbumin:Cr and she is limiting  meats  High BP, did not take BP medicines today; tries to limit salt  High cholesterol; taking statin, first statin caused aches, but the atorvastatin is doing well; no aches  Relevant past medical, surgical, family and social history reviewed and updated as indicated. Interim medical history since our last visit reviewed. Allergies and medications reviewed and updated.  Review of Systems Per HPI unless specifically indicated above     Objective:    BP 145/82 mmHg  Pulse 83  Temp(Src) 98.5 F (36.9 C)  Ht 5' 3.75" (1.619 m)  Wt 278 lb 12.8 oz (126.463 kg)  BMI 48.25 kg/m2  SpO2 95%  Wt Readings from Last 3 Encounters:  11/30/15 278 lb 12.8 oz (126.463 kg)  07/28/15 280 lb (127.007 kg)  04/27/15 283 lb (128.368 kg)    Physical Exam  Constitutional: She appears well-developed and well-nourished. No distress.  Morbidly obese; weight down 2 pounds since October, down 5 pounds since July  HENT:  Head: Normocephalic and atraumatic.  Eyes: EOM are normal. No scleral icterus.  Neck: No thyromegaly present.  Cardiovascular: Normal rate, regular rhythm and normal heart sounds.   No murmur heard. Pulmonary/Chest: Effort normal and breath sounds normal. No respiratory  distress. She has no wheezes.  Abdominal: Soft. Bowel sounds are normal. She exhibits no distension.  Musculoskeletal: Normal range of motion. She exhibits no edema.       Right lower leg: She exhibits swelling (feels like area of dilated veins medial prox left calf; no erythema, no distal swelling).  Neurological: She is alert. She exhibits normal muscle tone.  Skin: Skin is warm and dry. She is not diaphoretic. No pallor.  Several scattered cherry angiomas; one dime-sized erythematous welt on the right volar forearm; no other visible lesions; healing abrasion on the right proximal forearm, no erythema, no drainage  Psychiatric: She has a normal mood and affect. Her behavior is normal. Judgment and thought content  normal.    Diabetic Foot Form - Detailed   Diabetic Foot Exam - detailed  Diabetic Foot exam was performed with the following findings:  Yes 11/30/2015  9:48 AM  Visual Foot Exam completed.:  Yes  Are the toenails long?:  No  Are the toenails thick?:  Yes  Are the toenails ingrown?:  No    Pulse Foot Exam completed.:  Yes  Right Dorsalis Pedis:  Present Left Dorsalis Pedis:  Present  Sensory Foot Exam Completed.:  Yes  Swelling:  No  Semmes-Weinstein Monofilament Test  R Site 1-Great Toe:  Pos L Site 1-Great Toe:  Pos  R Site 4:  Pos L Site 4:  Pos  R Site 5:  Pos L Site 5:  Pos        Results for orders placed or performed in visit on 07/28/15  HM DIABETES EYE EXAM  Result Value Ref Range   HM Diabetic Eye Exam Retinopathy (A) No Retinopathy      Assessment & Plan:   Problem List Items Addressed This Visit      Cardiovascular and Mediastinum   Varicose vein of leg    Try horse chestnut, compression wrap; can refer to vascular doctor if desired; reviewed s/s of DVT, reasons to seek immediate care        Endocrine   Diabetes mellitus with proliferative retinopathy ([place])    Check A1c today      Relevant Orders   Hgb A1c w/o eAG   Microalbumin / creatinine urine ratio     Musculoskeletal and Integument   Osteoporosis    Check vit D; order DEXA; fall precautions, calcium, vit D      Relevant Orders   VITAMIN D 25 Hydroxy (Vit-D Deficiency, Fractures)   DG Bone Density     Other   Hyperlipidemia    Check lipid panel today      Relevant Orders   Lipid Panel w/o Chol/HDL Ratio   Morbid obesity ([place])    Encouragement given; keep losing, every little bit counts and adds up      Proteinuria    Check urine microalbumin:creatinine      Relevant Orders   Microalbumin / creatinine urine ratio   Abnormal mammogram    Reviewed Nov 2016 report; next mammogram due Nov 2017      Medication monitoring encounter    Check labs today to monitor sgpt on the  statin, K+ and creatinine on the ACE-I, and electrolytes on the thiazide      Relevant Orders   Comprehensive metabolic panel    Other Visit Diagnoses    Urticaria    -  Primary    try claritin during day, add benadryl at night if needed; see AVS  Follow up plan: Return in about 3 months (around 02/27/2016) for thirty minute follow-up with fasting labs; Medicare wellness when due.  Orders Placed This Encounter  Procedures  . DG Bone Density  . Hgb A1c w/o eAG  . Lipid Panel w/o Chol/HDL Ratio  . VITAMIN D 25 Hydroxy (Vit-D Deficiency, Fractures)  . Comprehensive metabolic panel  . Microalbumin / creatinine urine ratio   An after-visit summary was printed and given to the patient at [place].  Please see the patient instructions which may contain other information and recommendations beyond what is mentioned above in the assessment and plan.

## 2015-11-30 NOTE — Assessment & Plan Note (Signed)
Check A1c today.

## 2015-11-30 NOTE — Assessment & Plan Note (Signed)
Encouragement given; keep losing, every little bit counts and adds up

## 2015-11-30 NOTE — Assessment & Plan Note (Signed)
Reviewed Nov 2016 report; next mammogram due Nov 2017

## 2015-11-30 NOTE — Assessment & Plan Note (Signed)
Check lipid panel today 

## 2015-11-30 NOTE — Assessment & Plan Note (Signed)
Try horse chestnut, compression wrap; can refer to vascular doctor if desired; reviewed s/s of DVT, reasons to seek immediate care

## 2015-12-01 LAB — COMPREHENSIVE METABOLIC PANEL
ALBUMIN: 4.1 g/dL (ref 3.6–4.8)
ALT: 10 IU/L (ref 0–32)
AST: 8 IU/L (ref 0–40)
Albumin/Globulin Ratio: 1.7 (ref 1.1–2.5)
Alkaline Phosphatase: 102 IU/L (ref 39–117)
BUN / CREAT RATIO: 22 (ref 11–26)
BUN: 16 mg/dL (ref 8–27)
Bilirubin Total: 0.4 mg/dL (ref 0.0–1.2)
CALCIUM: 9.6 mg/dL (ref 8.7–10.3)
CO2: 24 mmol/L (ref 18–29)
CREATININE: 0.74 mg/dL (ref 0.57–1.00)
Chloride: 100 mmol/L (ref 96–106)
GFR calc Af Amer: 97 mL/min/{1.73_m2} (ref >59)
GFR, EST NON AFRICAN AMERICAN: 84 mL/min/{1.73_m2} (ref >59)
GLOBULIN, TOTAL: 2.4 g/dL (ref 1.5–4.5)
GLUCOSE: 194 mg/dL — AB (ref 65–99)
Potassium: 4.1 mmol/L (ref 3.5–5.2)
SODIUM: 140 mmol/L (ref 134–144)
TOTAL PROTEIN: 6.5 g/dL (ref 6.0–8.5)

## 2015-12-01 LAB — LIPID PANEL W/O CHOL/HDL RATIO
CHOLESTEROL TOTAL: 152 mg/dL (ref 100–199)
HDL: 37 mg/dL — ABNORMAL LOW (ref >39)
LDL Calculated: 97 mg/dL (ref 0–99)
Triglycerides: 91 mg/dL (ref 0–149)
VLDL Cholesterol Cal: 18 mg/dL (ref 5–40)

## 2015-12-01 LAB — HGB A1C W/O EAG: Hgb A1c MFr Bld: 9.7 % — ABNORMAL HIGH (ref 4.8–5.6)

## 2015-12-01 LAB — VITAMIN D 25 HYDROXY (VIT D DEFICIENCY, FRACTURES): Vit D, 25-Hydroxy: 31.6 ng/mL (ref 30.0–100.0)

## 2015-12-09 ENCOUNTER — Telehealth: Payer: Self-pay | Admitting: Family Medicine

## 2015-12-09 NOTE — Telephone Encounter (Signed)
I called patient; she is going to start back on the Invokana Her A1c is high She wasn't disappointed about cholesterol, but really didn't think the A1c would be that high She is going to call Dr. Holley Raring; eating less meat, more meatless meals, smaller servings She has an appt with eye clinic and dentist She has bad days with her diet; she is trying to do better; cutting down on starches and sweets; encouragement given Fasting labs and visit with me May 15th or just after

## 2016-01-25 ENCOUNTER — Other Ambulatory Visit: Payer: Self-pay | Admitting: Family Medicine

## 2016-01-25 NOTE — Telephone Encounter (Signed)
Feb 2017 labs reivewed Refills sent as requested Please ask pt to schedule f/u appt with fasting labs on or after Feb 28, 2016

## 2016-01-25 NOTE — Telephone Encounter (Signed)
Left voice message to inform patient that prescriptions have been called into her pharmacy and to give [place] a call to schedule appointment after May 16.

## 2016-02-24 DIAGNOSIS — E113391 Type 2 diabetes mellitus with moderate nonproliferative diabetic retinopathy without macular edema, right eye: Secondary | ICD-10-CM | POA: Diagnosis not present

## 2016-03-06 ENCOUNTER — Ambulatory Visit (INDEPENDENT_AMBULATORY_CARE_PROVIDER_SITE_OTHER): Payer: Medicare Other | Admitting: Family Medicine

## 2016-03-06 ENCOUNTER — Encounter: Payer: Self-pay | Admitting: Family Medicine

## 2016-03-06 VITALS — BP 130/74 | HR 94 | Temp 98.1°F | Resp 16 | Wt 277.0 lb

## 2016-03-06 DIAGNOSIS — R809 Proteinuria, unspecified: Secondary | ICD-10-CM | POA: Diagnosis not present

## 2016-03-06 DIAGNOSIS — Z5181 Encounter for therapeutic drug level monitoring: Secondary | ICD-10-CM

## 2016-03-06 DIAGNOSIS — N182 Chronic kidney disease, stage 2 (mild): Secondary | ICD-10-CM

## 2016-03-06 DIAGNOSIS — I10 Essential (primary) hypertension: Secondary | ICD-10-CM | POA: Diagnosis not present

## 2016-03-06 DIAGNOSIS — E113513 Type 2 diabetes mellitus with proliferative diabetic retinopathy with macular edema, bilateral: Secondary | ICD-10-CM

## 2016-03-06 LAB — POCT GLYCOSYLATED HEMOGLOBIN (HGB A1C): HEMOGLOBIN A1C: 7.1

## 2016-03-06 MED ORDER — CLOBETASOL PROPIONATE 0.05 % EX CREA: 1.0000 "application " | TOPICAL_CREAM | Freq: Two times a day (BID) | CUTANEOUS | Status: DC

## 2016-03-06 NOTE — Assessment & Plan Note (Addendum)
Foot exam by MD today; work on weight loss, healthier eating; A1c is down to 7.1; return in 3 months

## 2016-03-06 NOTE — Assessment & Plan Note (Signed)
Urged patient to call nephrologist to see him; elevated urine microalbumin:Cr; patient agrees to call kidney doctor

## 2016-03-06 NOTE — Assessment & Plan Note (Signed)
Suggested counseling, but patient politely declined; significant weight loss would help so many of her health problems; see AVS

## 2016-03-06 NOTE — Assessment & Plan Note (Signed)
Check liver function every six months; next labs for DM, kidneys, liver in 3 more months

## 2016-03-06 NOTE — Assessment & Plan Note (Signed)
Patient to call kidney doctor; limiting meat

## 2016-03-06 NOTE — Progress Notes (Signed)
BP 130/74 mmHg  Pulse 94  Temp(Src) 98.1 F (36.7 C) (Oral)  Resp 16  Wt 277 lb (125.646 kg)  SpO2 93%   Subjective:    Patient ID: Julie [name], female    DOB: Aug 21, 1946, 70 y.o.   MRN: [id number]  HPI: Julie [name] is a 70 y.o. female  Chief Complaint  Patient presents with  . Diabetes   Type 2 diabetes; back on the invokana about 6-7 weeks; has been eating sweets, but back on track; no sores on the feet; likes ice cream and cookies, but got rid of them for the most part; she identified it; eating out a lot, meats and veggies; she had the treatment for her diabetic retinopathy and the eye doctor has turned her loose until Sept or so; she may be able to have her cataracts treated this Fall  Urine microalbumin has increased over last 10 months; she will call to make appt; she put off going to the kidney doctor when asked in Feb; she put that off; eating less meat   Ref Range 31mo ago  37mo ago  19mo ago      Microalb, Ur Waived 0 - 19 mg/L 150 (H)      Creatinine, Urine Waived 10 - 300 mg/dL 100      Microalb/Creat Ratio <30 mg/g >300 (H) 134.3 (H)R 84.6 (H)R        Cholesterol; on statin; trying to eat more chicken; does like fried foods, but tries to bake or grill at home; not much like cheese, occasionally eats pizza; not a dairy drinker; usually gets egg whites if going out to breakfast; loves bacon; has not tried [place] bacon Lab Results  Component Value Date   CHOL 152 11/30/2015   CHOL 139 07/28/2015   CHOL 156 04/27/2015   Lab Results  Component Value Date   HDL 37* 11/30/2015   HDL 44 07/28/2015   HDL 36* 04/27/2015   Lab Results  Component Value Date   LDLCALC 97 11/30/2015   LDLCALC 76 07/28/2015   LDLCALC 99 04/27/2015   Lab Results  Component Value Date   TRIG 91 11/30/2015   TRIG 93 07/28/2015   TRIG 104 04/27/2015   No results found for: CHOLHDL No results found for: LDLDIRECT  Vitamin D was 31.6 last check Feb; taking 1000 iu  vitamin D3 daily  Obesity; keeping the wrong snacks; lots of snacking; midnight; not interested in counseling, will keep trying to work on it herself; will try to have better snacks; baked chips instead of fried; celery and salsa  Has a rash on the back of the right calf; has been scratching it; thinks it was bed bugs that started it; they have treated their house, but she still has some redness and itching  Depression screen [facility] 2/9 03/06/2016 03/06/2016 04/27/2015  Decreased Interest 0 0 0  Down, Depressed, Hopeless 1 0 0  PHQ - 2 Score 1 0 0   Relevant past medical, surgical, family and social history reviewed Past Medical History  Diagnosis Date  . Hypertension   . Hyperlipidemia   . Diabetes mellitus without complication ([place])   . CKD (chronic kidney disease) stage 2, GFR 60-89 ml/min   . Morbid obesity ([place])   . Proteinuria Jan 2015    referred to Nephrology  . Arthritis   . Osteoporosis March 2015  . Cataract    Past Surgical History  Procedure Laterality Date  . Tonsillectomy    .  Dilation and curettage of uterus  2007  . Mouth surgery  2007    cyst   Family History  Problem Relation Age of Onset  . Glaucoma Mother   . Cancer Father     liver  . Cancer Sister     lung, bone  . COPD Sister   . Heart disease Maternal Grandmother     chf  . Hypertension Brother   . Hypertension Maternal Uncle   . Cancer Paternal Grandmother     ovarian  . Breast cancer Neg Hx    Social History  Substance Use Topics  . Smoking status: Never Smoker   . Smokeless tobacco: Never Used  . Alcohol Use: No   Interim medical history since last visit reviewed. Allergies and medications reviewed  Review of Systems Per HPI unless specifically indicated above     Objective:    BP 130/74 mmHg  Pulse 94  Temp(Src) 98.1 F (36.7 C) (Oral)  Resp 16  Wt 277 lb (125.646 kg)  SpO2 93%  Wt Readings from Last 3 Encounters:  03/06/16 277 lb (125.646 kg)  11/30/15 278 lb 12.8 oz  (126.463 kg)  07/28/15 280 lb (127.007 kg)    Physical Exam  Constitutional: She appears well-developed and well-nourished. No distress.  Morbidly obese, weight concentrated around buttocks, hips, thighs  HENT:  Head: Normocephalic and atraumatic.  Eyes: EOM are normal. No scleral icterus.  Neck: No thyromegaly present.  Cardiovascular: Normal rate, regular rhythm and normal heart sounds.   No murmur heard. Pulmonary/Chest: Effort normal and breath sounds normal. No respiratory distress. She has no wheezes.  Abdominal: Soft. Bowel sounds are normal. She exhibits no distension.  Musculoskeletal: Normal range of motion. She exhibits no edema.  Neurological: She is alert. She exhibits normal muscle tone.  Skin: Skin is warm and dry. Rash (multiple erythematous lesions on the posterior right calf) noted. She is not diaphoretic. No pallor.  Psychiatric: She has a normal mood and affect. Her behavior is normal. Judgment and thought content normal.   Diabetic Foot Form - Detailed   Diabetic Foot Exam - detailed  Diabetic Foot exam was performed with the following findings:  Yes 03/06/2016  9:53 AM  Visual Foot Exam completed.:  Yes  Are the toenails long?:  No  Are the toenails thick?:  No  Are the toenails ingrown?:  No  Normal Range of Motion:  Yes    Pulse Foot Exam completed.:  Yes  Right Dorsalis Pedis:  Present Left Dorsalis Pedis:  Present  Sensory Foot Exam Completed.:  Yes  Swelling:  No  Semmes-Weinstein Monofilament Test  R Site 1-Great Toe:  Pos L Site 1-Great Toe:  Pos  R Site 4:  Pos L Site 4:  Pos  R Site 5:  Pos L Site 5:  Pos       Results for orders placed or performed in visit on 03/06/16  POCT HgB A1C  Result Value Ref Range   Hemoglobin A1C 7.1       Assessment & Plan:   Problem List Items Addressed This Visit      Cardiovascular and Mediastinum   Hypertension    Well-controlled today        Endocrine   Diabetes mellitus with proliferative  retinopathy ([place]) - Primary    Foot exam by MD today; work on weight loss, healthier eating; A1c is down to 7.1; return in 3 months      Relevant Medications   INVOKANA 100  MG TABS tablet   Other Relevant Orders   POCT HgB A1C (Completed)     Genitourinary   CKD (chronic kidney disease) stage 2, GFR 60-89 ml/min    Urged patient to call nephrologist to see him; elevated urine microalbumin:Cr; patient agrees to call kidney doctor        Other   Medication monitoring encounter    Check liver function every six months; next labs for DM, kidneys, liver in 3 more months      Morbid obesity ([place])    Suggested counseling, but patient politely declined; significant weight loss would help so many of her health problems; see AVS      Relevant Medications   INVOKANA 100 MG TABS tablet   Proteinuria    Patient to call kidney doctor; limiting meat         Follow up plan: Return in about 3 months (around 06/06/2016) for fasting labs and visit with Dr. Sanda Klein.  An after-visit summary was printed and given to the patient at [place].  Please see the patient instructions which may contain other information and recommendations beyond what is mentioned above in the assessment and plan.  Meds ordered this encounter  Medications  . INVOKANA 100 MG TABS tablet    Sig:   . clobetasol cream (TEMOVATE) 0.05 %    Sig: Apply 1 application topically 2 (two) times daily. Too strong for face, under arms; groin    Dispense:  30 g    Refill:  0   Orders Placed This Encounter  Procedures  . POCT HgB A1C

## 2016-03-06 NOTE — Assessment & Plan Note (Signed)
Well controlled today.

## 2016-03-06 NOTE — Patient Instructions (Signed)
Please do call your kidney doctor and get in to see him soon Please do see your eye doctor regularly, and have your eyes examined every year (or more often per his or her recommendation) Check your feet every night and let me know right away of any sores, infections, numbness, etc. Try to limit sweets, white bread, white rice, white potatoes It is okay with me for you to not check your fingerstick blood sugars (per [employer] of Endocrinology Best Practices), unless you are interested and feel it would be helpful for you Try to limit saturated fats in your diet (bologna, hot dogs, barbeque, cheeseburgers, hamburgers, steak, bacon, sausage, cheese, etc.) and get more fresh fruits, vegetables, and whole grains Check out the information at familydoctor.org entitled "Nutrition for Weight Loss: What You Need to Know about Fad Diets" Try to lose between 1-2 pounds per week by taking in fewer calories and burning off more calories You can succeed by limiting portions, limiting foods dense in calories and fat, becoming more active, and drinking 8 glasses of water a day (64 ounces) Don't skip meals, especially breakfast, as skipping meals may alter your metabolism Do not use over-the-counter weight loss pills or gimmicks that claim rapid weight loss A healthy BMI (or body mass index) is between 18.5 and 24.9 You can calculate your ideal BMI at the [place] website [employer]Mon[employer]fr

## 2016-04-10 ENCOUNTER — Other Ambulatory Visit: Payer: Self-pay | Admitting: Family Medicine

## 2016-04-11 NOTE — Telephone Encounter (Signed)
Feb 2017 SGPT and lipids reviewed; Rxs approved

## 2016-04-27 ENCOUNTER — Telehealth: Payer: Self-pay | Admitting: Family Medicine

## 2016-04-27 MED ORDER — CYCLOBENZAPRINE HCL 5 MG PO TABS: 5.0000 mg | ORAL_TABLET | Freq: Three times a day (TID) | ORAL | Status: AC | PRN

## 2016-04-27 NOTE — Telephone Encounter (Signed)
sent 

## 2016-04-27 NOTE — Telephone Encounter (Signed)
Pt states she hurt her back while moving furniture and is requesting a muscle relaxer to be called into her pharmacy. [facility] on [address] Please advise.

## 2016-05-18 ENCOUNTER — Other Ambulatory Visit: Payer: Self-pay | Admitting: Family Medicine

## 2016-06-06 ENCOUNTER — Encounter: Payer: Self-pay | Admitting: Family Medicine

## 2016-06-06 ENCOUNTER — Ambulatory Visit (INDEPENDENT_AMBULATORY_CARE_PROVIDER_SITE_OTHER): Payer: Medicare Other | Admitting: Family Medicine

## 2016-06-06 DIAGNOSIS — M545 Low back pain, unspecified: Secondary | ICD-10-CM | POA: Insufficient documentation

## 2016-06-06 DIAGNOSIS — N182 Chronic kidney disease, stage 2 (mild): Secondary | ICD-10-CM | POA: Diagnosis not present

## 2016-06-06 DIAGNOSIS — E785 Hyperlipidemia, unspecified: Secondary | ICD-10-CM | POA: Diagnosis not present

## 2016-06-06 DIAGNOSIS — I10 Essential (primary) hypertension: Secondary | ICD-10-CM | POA: Diagnosis not present

## 2016-06-06 DIAGNOSIS — Z5181 Encounter for therapeutic drug level monitoring: Secondary | ICD-10-CM

## 2016-06-06 DIAGNOSIS — E113513 Type 2 diabetes mellitus with proliferative diabetic retinopathy with macular edema, bilateral: Secondary | ICD-10-CM

## 2016-06-06 DIAGNOSIS — M5442 Lumbago with sciatica, left side: Secondary | ICD-10-CM

## 2016-06-06 NOTE — Assessment & Plan Note (Signed)
Discussed her childhood food insecurity; she declined counseling; don't shop when hungry; have healthy low calorie snacks available; drink water first

## 2016-06-06 NOTE — Assessment & Plan Note (Signed)
Will try back exercises and good posture; avoid long-term NSAIDs

## 2016-06-06 NOTE — Assessment & Plan Note (Signed)
Check lipids; continue med; move more and work on weight loss; limit saturated fats

## 2016-06-06 NOTE — Progress Notes (Signed)
BP 138/78   Pulse 88   Temp 97.9 F (36.6 C) (Oral)   Resp 14   Wt 279 lb 8 oz (126.8 kg)   SpO2 94%   BMI 48.35 kg/m    Subjective:    Patient ID: Julie [name], female    DOB: [birth year], 70 y.o.   MRN: [id number]  HPI: Julie [name] is a 70 y.o. female  Chief Complaint  Patient presents with  . Follow-up    3 months    Type 2 diabetes; having some blurred vision from the cataracts; going to eye doctor; no longer needing shots in the eyes any more; no numbness in the feet; not bad dry mouth; staying hydrated; nocturia x 1; not checking FSBS  She had a back issue a month ago; moved furniture and the medicine a month ago really helped  High blood pressure; checks BP at home occasionally; avoiding NSAIDs; room for improvement in salt avoidance; uses salt on tomatoes and eggs  Last a1c was 7.1 three months ago; last vit D was 31.6; other labs reviewed; low HDL; last reading was 37  Obesity; she wants to get back on the ball and wants to try to snack less at night; she has ice cream and cookies; had food insecurity as a child  Depression screen [facility] 2/9 06/06/2016 03/06/2016 03/06/2016 04/27/2015  Decreased Interest 0 0 0 0  Down, Depressed, Hopeless 1 1 0 0  PHQ - 2 Score 1 1 0 0  Altered sleeping 3 - - -  Tired, decreased energy 0 - - -  Change in appetite 2 - - -  Feeling bad or failure about yourself  0 - - -  Trouble concentrating 0 - - -  Moving slowly or fidgety/restless 0 - - -  Suicidal thoughts 0 - - -  PHQ-9 Score 6 - - -  Difficult doing work/chores Not difficult at all - - -   Relevant past medical, surgical, family and social history reviewed Past Medical History:  Diagnosis Date  . Arthritis   . Cataract   . CKD (chronic kidney disease) stage 2, GFR 60-89 ml/min   . Diabetes mellitus without complication ([place])   . Hyperlipidemia   . Hypertension   . Morbid obesity ([place])   . Osteoporosis March 2015  . Proteinuria Jan 2015   referred to  Nephrology   Past Surgical History:  Procedure Laterality Date  . DILATION AND CURETTAGE OF UTERUS  2007  . MOUTH SURGERY  2007   cyst  . TONSILLECTOMY     Family History  Problem Relation Age of Onset  . Glaucoma Mother   . Cancer Father     liver  . Cancer Sister     lung, bone  . COPD Sister   . Heart disease Maternal Grandmother     chf  . Hypertension Brother   . Hypertension Maternal Uncle   . Cancer Paternal Grandmother     ovarian  . Breast cancer Neg Hx    Social History  Substance Use Topics  . Smoking status: Never Smoker  . Smokeless tobacco: Never Used  . Alcohol use No    Interim medical history since last visit reviewed. Allergies and medications reviewed  Review of Systems Per HPI unless specifically indicated above     Objective:    BP 138/78   Pulse 88   Temp 97.9 F (36.6 C) (Oral)   Resp 14   Wt 279 lb 8 oz (  126.8 kg)   SpO2 94%   BMI 48.35 kg/m   Wt Readings from Last 3 Encounters:  06/06/16 279 lb 8 oz (126.8 kg)  03/06/16 277 lb (125.6 kg)  11/30/15 278 lb 12.8 oz (126.5 kg)    Physical Exam  Constitutional: She appears well-developed and well-nourished. No distress.  Morbidly obese, weight concentrated around buttocks, hips, thighs  HENT:  Head: Normocephalic and atraumatic.  Eyes: EOM are normal. No scleral icterus.  Neck: No thyromegaly present.  Cardiovascular: Normal rate, regular rhythm and normal heart sounds.   No murmur heard. Pulmonary/Chest: Effort normal and breath sounds normal. No respiratory distress. She has no wheezes.  Abdominal: Soft. Bowel sounds are normal. She exhibits no distension.  Musculoskeletal: Normal range of motion. She exhibits no edema.       Lumbar back: She exhibits no tenderness, no bony tenderness, no edema and no spasm.  Neurological: She is alert. She exhibits normal muscle tone.  Skin: Skin is warm and dry. She is not diaphoretic. No pallor.  Psychiatric: She has a normal mood and  affect. Her behavior is normal. Judgment and thought content normal. Her mood appears not anxious. She does not exhibit a depressed mood.   Diabetic Foot Form - Detailed   Diabetic Foot Exam - detailed Diabetic Foot exam was performed with the following findings:  Yes 06/06/2016  9:41 AM  Visual Foot Exam completed.:  Yes  Are the toenails long?:  No Are the toenails thick?:  No Are the toenails ingrown?:  No Normal Range of Motion:  Yes Pulse Foot Exam completed.:  Yes  Right Dorsalis Pedis:  Present Left Dorsalis Pedis:  Present  Sensory Foot Exam Completed.:  Yes Swelling:  No Semmes-Weinstein Monofilament Test R Site 1-Great Toe:  Pos L Site 1-Great Toe:  Pos  R Site 4:  Pos L Site 4:  Pos  R Site 5:  Pos L Site 5:  Pos       Results for orders placed or performed in visit on 03/06/16  POCT HgB A1C  Result Value Ref Range   Hemoglobin A1C 7.1       Assessment & Plan:   Problem List Items Addressed This Visit      Cardiovascular and Mediastinum   Hypertension    Avoid salt when possible; avoid salt substitutes        Endocrine   Diabetes mellitus with proliferative retinopathy ([place])    Foot exam today by MD; eye exams regularly; check A1c today      Relevant Orders   Hemoglobin A1c     Genitourinary   CKD (chronic kidney disease) stage 2, GFR 60-89 ml/min    Avoid NSAIDs        Other   Morbid obesity ([place])    Discussed her childhood food insecurity; she declined counseling; don't shop when hungry; have healthy low calorie snacks available; drink water first      Medication monitoring encounter    Check sgpt, creatinine, K+      Relevant Orders   COMPLETE METABOLIC PANEL WITH GFR   Low back pain    Will try back exercises and good posture; avoid long-term NSAIDs      Hyperlipidemia    Check lipids; continue med; move more and work on weight loss; limit saturated fats      Relevant Orders   Lipid panel    Other Visit Diagnoses   None.       Follow up plan: Return  in about 3 months (around 09/06/2016).  An after-visit summary was printed and given to the patient at [facility].  Please see the patient instructions which may contain other information and recommendations beyond what is mentioned above in the assessment and plan.  No orders of the defined types were placed in this encounter.   Orders Placed This Encounter  Procedures  . COMPLETE METABOLIC PANEL WITH GFR  . Hemoglobin A1c  . Lipid panel

## 2016-06-06 NOTE — Assessment & Plan Note (Signed)
Avoid NSAIDs 

## 2016-06-06 NOTE — Assessment & Plan Note (Signed)
Check sgpt, creatinine, K+ 

## 2016-06-06 NOTE — Assessment & Plan Note (Signed)
Avoid salt when possible; avoid salt substitutes

## 2016-06-06 NOTE — Patient Instructions (Addendum)
Avoid salt substitutes Your goal blood pressure is less than 140 mmHg on top. Try to follow the DASH guidelines (DASH stands for Dietary Approaches to Stop Hypertension) Try to limit the sodium in your diet.  Ideally, consume less than 1.5 grams (less than 1,500mg ) per day. Do not add salt when cooking or at the table.  Check the sodium amount on labels when shopping, and choose items lower in sodium when given a choice. Avoid or limit foods that already contain a lot of sodium. Eat a diet rich in fruits and vegetables and whole grains.  Please do see your eye doctor regularly, and have your eyes examined every year (or more often per his or her recommendation) Check your feet every night and let me know right away of any sores, infections, numbness, etc. Try to limit sweets, white bread, white rice, white potatoes It is okay with me for you to not check your fingerstick blood sugars (per [employer] of [employer]ocrinology Best Practices), unless you are interested and feel it would be helpful for you  We'll get labs today and see where we stand  Check out the information at familydoctor.org entitled "Nutrition for Weight Loss: What You Need to Know about Fad Diets" Try to lose between 1-2 pounds per week by taking in fewer calories and burning off more calories You can succeed by limiting portions, limiting foods dense in calories and fat, becoming more active, and drinking 8 glasses of water a day (64 ounces) Don't skip meals, especially breakfast, as skipping meals may alter your metabolism Do not use over-the-counter weight loss pills or gimmicks that claim rapid weight loss A healthy BMI (or body mass index) is between 18.5 and 24.9 You can calculate your ideal BMI at the [place] website [employer]Monet[employer].fr

## 2016-06-06 NOTE — Assessment & Plan Note (Signed)
Foot exam today by MD; eye exams regularly; check A1c today

## 2016-06-07 LAB — COMPLETE METABOLIC PANEL WITH GFR
ALBUMIN: 3.8 g/dL (ref 3.6–5.1)
ALK PHOS: 79 U/L (ref 33–130)
ALT: 12 U/L (ref 6–29)
AST: 12 U/L (ref 10–35)
BILIRUBIN TOTAL: 0.4 mg/dL (ref 0.2–1.2)
BUN: 20 mg/dL (ref 7–25)
CALCIUM: 9.7 mg/dL (ref 8.6–10.4)
CO2: 27 mmol/L (ref 20–31)
Chloride: 104 mmol/L (ref 98–110)
Creat: 0.89 mg/dL (ref 0.50–0.99)
GFR, EST NON AFRICAN AMERICAN: 67 mL/min (ref >=60)
GFR, Est African American: 77 mL/min (ref >=60)
GLUCOSE: 150 mg/dL — AB (ref 65–99)
POTASSIUM: 4.2 mmol/L (ref 3.5–5.3)
Sodium: 140 mmol/L (ref 135–146)
Total Protein: 6.5 g/dL (ref 6.1–8.1)

## 2016-06-07 LAB — LIPID PANEL
CHOL/HDL RATIO: 3.1 ratio (ref <=5.0)
CHOLESTEROL: 133 mg/dL (ref 125–200)
HDL: 43 mg/dL — ABNORMAL LOW (ref >=46)
LDL Cholesterol: 75 mg/dL (ref <130)
TRIGLYCERIDES: 76 mg/dL (ref <150)
VLDL: 15 mg/dL (ref <30)

## 2016-06-07 LAB — HEMOGLOBIN A1C
Hgb A1c MFr Bld: 7.6 % — ABNORMAL HIGH (ref <5.7)
MEAN PLASMA GLUCOSE: 171 mg/dL

## 2016-06-25 DIAGNOSIS — E113311 Type 2 diabetes mellitus with moderate nonproliferative diabetic retinopathy with macular edema, right eye: Secondary | ICD-10-CM | POA: Diagnosis not present

## 2016-06-25 DIAGNOSIS — E113312 Type 2 diabetes mellitus with moderate nonproliferative diabetic retinopathy with macular edema, left eye: Secondary | ICD-10-CM | POA: Diagnosis not present

## 2016-06-25 LAB — HM DIABETES EYE EXAM

## 2016-07-04 DIAGNOSIS — E113312 Type 2 diabetes mellitus with moderate nonproliferative diabetic retinopathy with macular edema, left eye: Secondary | ICD-10-CM | POA: Diagnosis not present

## 2016-07-06 ENCOUNTER — Other Ambulatory Visit: Payer: Self-pay | Admitting: Family Medicine

## 2016-07-06 NOTE — Telephone Encounter (Signed)
Labs from Aug reviewed; Rxs approved

## 2016-07-10 ENCOUNTER — Other Ambulatory Visit: Payer: Self-pay | Admitting: Family Medicine

## 2016-07-10 DIAGNOSIS — Z1231 Encounter for screening mammogram for malignant neoplasm of breast: Secondary | ICD-10-CM

## 2016-07-19 ENCOUNTER — Telehealth: Payer: Self-pay | Admitting: Family Medicine

## 2016-07-19 NOTE — Telephone Encounter (Signed)
Pt is scheduled for her mammogram for 08/27/2016 and has also scheduled to have bone density on the same day. Pt does need a order in for them to do this for her. [place].

## 2016-07-19 NOTE — Telephone Encounter (Signed)
It looks like the orders are in there The DEXA was entered back in Feb and the mammo was entered in Sept If I'm missing something, please let me know

## 2016-07-25 DIAGNOSIS — E113311 Type 2 diabetes mellitus with moderate nonproliferative diabetic retinopathy with macular edema, right eye: Secondary | ICD-10-CM | POA: Diagnosis not present

## 2016-08-15 ENCOUNTER — Other Ambulatory Visit: Payer: Self-pay | Admitting: Family Medicine

## 2016-08-15 DIAGNOSIS — E113212 Type 2 diabetes mellitus with mild nonproliferative diabetic retinopathy with macular edema, left eye: Secondary | ICD-10-CM | POA: Diagnosis not present

## 2016-08-16 ENCOUNTER — Encounter: Payer: Self-pay | Admitting: Family Medicine

## 2016-08-16 NOTE — Telephone Encounter (Signed)
Last ALT reviewed; Rxs approved

## 2016-08-22 DIAGNOSIS — E113311 Type 2 diabetes mellitus with moderate nonproliferative diabetic retinopathy with macular edema, right eye: Secondary | ICD-10-CM | POA: Diagnosis not present

## 2016-08-27 ENCOUNTER — Other Ambulatory Visit: Payer: Medicare Other

## 2016-08-27 ENCOUNTER — Ambulatory Visit: Payer: Medicare Other

## 2016-09-05 ENCOUNTER — Ambulatory Visit: Payer: Medicare Other | Admitting: Family Medicine

## 2016-09-20 DIAGNOSIS — E113311 Type 2 diabetes mellitus with moderate nonproliferative diabetic retinopathy with macular edema, right eye: Secondary | ICD-10-CM | POA: Diagnosis not present

## 2016-09-24 ENCOUNTER — Ambulatory Visit
Admission: RE | Admit: 2016-09-24 | Discharge: 2016-09-24 | Disposition: A | Discharge status: Home / Self Care | Payer: Medicare Other | Source: Ambulatory Visit | Attending: Family Medicine | Admitting: Family Medicine

## 2016-09-24 DIAGNOSIS — Z780 Asymptomatic menopausal state: Secondary | ICD-10-CM | POA: Diagnosis not present

## 2016-09-24 DIAGNOSIS — M8588 Other specified disorders of bone density and structure, other site: Secondary | ICD-10-CM | POA: Diagnosis not present

## 2016-09-24 DIAGNOSIS — M810 Age-related osteoporosis without current pathological fracture: Secondary | ICD-10-CM | POA: Insufficient documentation

## 2016-09-24 DIAGNOSIS — Z1231 Encounter for screening mammogram for malignant neoplasm of breast: Secondary | ICD-10-CM | POA: Diagnosis not present

## 2016-09-26 DIAGNOSIS — E113212 Type 2 diabetes mellitus with mild nonproliferative diabetic retinopathy with macular edema, left eye: Secondary | ICD-10-CM | POA: Diagnosis not present

## 2016-10-25 DIAGNOSIS — E113211 Type 2 diabetes mellitus with mild nonproliferative diabetic retinopathy with macular edema, right eye: Secondary | ICD-10-CM | POA: Diagnosis not present

## 2016-11-07 DIAGNOSIS — E113212 Type 2 diabetes mellitus with mild nonproliferative diabetic retinopathy with macular edema, left eye: Secondary | ICD-10-CM | POA: Diagnosis not present

## 2016-11-13 ENCOUNTER — Other Ambulatory Visit: Payer: Self-pay

## 2016-11-14 MED ORDER — LISINOPRIL 40 MG PO TABS: 40.0000 mg | ORAL_TABLET | Freq: Every day | ORAL | 0 refills | Status: DC

## 2016-11-14 MED ORDER — ATORVASTATIN CALCIUM 10 MG PO TABS
10.0000 mg | ORAL_TABLET | Freq: Every day | ORAL | 0 refills | Status: DC
Start: 2016-11-14 — End: 2017-03-18

## 2016-11-14 MED ORDER — HYDROCHLOROTHIAZIDE 25 MG PO TABS
25.0000 mg | ORAL_TABLET | Freq: Every day | ORAL | 0 refills | Status: DC
Start: 2016-11-14 — End: 2017-03-18

## 2016-11-14 MED ORDER — METFORMIN HCL 500 MG PO TABS: ORAL_TABLET | ORAL | 0 refills | Status: DC

## 2016-11-14 NOTE — Telephone Encounter (Signed)
Patient canceled her last appt with me in November I'll refill meds, but please ask her to schedule an appt soon for her diabetes and other health issues

## 2016-11-15 NOTE — Telephone Encounter (Signed)
LMOM for pt to call our office to schedule an appt °

## 2016-12-03 ENCOUNTER — Ambulatory Visit: Payer: Medicare Other | Admitting: Family Medicine

## 2016-12-05 ENCOUNTER — Telehealth: Payer: Self-pay | Admitting: Family Medicine

## 2016-12-05 NOTE — Telephone Encounter (Signed)
Patient canceled her appt with me in November and again in February Her last labs were more than 6 months ago Please find out if she is still planning on staying here as a patient (I just don't know with her canceling her last two visits); if she's staying, then please ask her to schedule a visit and come fasting for bloodwork Thank you

## 2016-12-06 DIAGNOSIS — E113311 Type 2 diabetes mellitus with moderate nonproliferative diabetic retinopathy with macular edema, right eye: Secondary | ICD-10-CM | POA: Diagnosis not present

## 2016-12-06 NOTE — Telephone Encounter (Signed)
LMOM to have patient call the office back.

## 2016-12-11 NOTE — Telephone Encounter (Signed)
Please try her again, then send letter and close note if no response; thank you

## 2016-12-12 NOTE — Telephone Encounter (Signed)
LMOM to scheduled an appt

## 2016-12-14 NOTE — Telephone Encounter (Signed)
Phone note still open I'll send letter and close note

## 2016-12-19 DIAGNOSIS — E113212 Type 2 diabetes mellitus with mild nonproliferative diabetic retinopathy with macular edema, left eye: Secondary | ICD-10-CM | POA: Diagnosis not present

## 2017-01-03 ENCOUNTER — Ambulatory Visit: Payer: Medicare Other | Admitting: Family Medicine

## 2017-01-04 ENCOUNTER — Ambulatory Visit (INDEPENDENT_AMBULATORY_CARE_PROVIDER_SITE_OTHER): Payer: Medicare Other | Admitting: Family Medicine

## 2017-01-04 ENCOUNTER — Encounter: Payer: Self-pay | Admitting: Family Medicine

## 2017-01-04 DIAGNOSIS — E113513 Type 2 diabetes mellitus with proliferative diabetic retinopathy with macular edema, bilateral: Secondary | ICD-10-CM | POA: Diagnosis not present

## 2017-01-04 DIAGNOSIS — E782 Mixed hyperlipidemia: Secondary | ICD-10-CM

## 2017-01-04 DIAGNOSIS — L309 Dermatitis, unspecified: Secondary | ICD-10-CM

## 2017-01-04 DIAGNOSIS — N182 Chronic kidney disease, stage 2 (mild): Secondary | ICD-10-CM

## 2017-01-04 DIAGNOSIS — Z5181 Encounter for therapeutic drug level monitoring: Secondary | ICD-10-CM

## 2017-01-04 DIAGNOSIS — I10 Essential (primary) hypertension: Secondary | ICD-10-CM

## 2017-01-04 DIAGNOSIS — R808 Other proteinuria: Secondary | ICD-10-CM | POA: Diagnosis not present

## 2017-01-04 DIAGNOSIS — D485 Neoplasm of uncertain behavior of skin: Secondary | ICD-10-CM

## 2017-01-04 NOTE — Assessment & Plan Note (Signed)
Right cheek; refer to derm

## 2017-01-04 NOTE — Assessment & Plan Note (Signed)
Foot exam by MD; check A1c, urine microalbumin; eye exam UTD; encouragement given

## 2017-01-04 NOTE — Assessment & Plan Note (Signed)
Recheck urine today; already saw Dr. Holley Raring (nephro)

## 2017-01-04 NOTE — Assessment & Plan Note (Signed)
Controlled; encourage DASH guidelines

## 2017-01-04 NOTE — Progress Notes (Signed)
BP 138/76   Pulse 98   Temp 98.2 F (36.8 C) (Oral)   Resp 16   Wt 284 lb 11.2 oz (129.1 kg)   SpO2 94%   BMI 49.25 kg/m    Subjective:    Patient ID: Julie [name], female    DOB: 10/06/1946, 71 y.o.   MRN: [id number]  HPI: Julie [name] is a 71 y.o. female  Chief Complaint  Patient presents with  . Medication Refill  . Follow-up   Patient is here for f/u Diabetes mellitus type 2  She has been affected by stress; knows her sugar is probably high Could not afford [employer], $600 for the prescription Checking sugars?  yes How often? n/a Range (low to high) over last two weeks:  n/a Does patient feel additional teaching/training would be helpful?  no; she will think about going back for refreshers Trying to limit white bread, white rice, white potatoes, sweets?  does well limiting potatoes; struggles with others Trying to limit sweetened drinks like iced tea, soft drinks, sports drinks, fruit juices?  yes Checking feet every day/night?  yes Last eye exam:  Regular and UTD Spilling protein; saw Dr. Holley Raring years ago  Needs to see a dermatologist for a place that's growing on her right cheek; has dry patch on the left hip  BP shows good control  Cholesterol; just one piece of bacon out and about but not cooking at home; really cut out eggs; using egg whites sometimes  Last vit D up to normal range  Morbid obesity; weight gain, patient admits to dietary indiscretion  Depression screen [facility] 2/9 01/04/2017 06/06/2016 03/06/2016 03/06/2016 04/27/2015  Decreased Interest 0 0 0 0 0  Down, Depressed, Hopeless 1 1 1  0 0  PHQ - 2 Score 1 1 1  0 0  Altered sleeping - 3 - - -  Tired, decreased energy - 0 - - -  Change in appetite - 2 - - -  Feeling bad or failure about yourself  - 0 - - -  Trouble concentrating - 0 - - -  Moving slowly or fidgety/restless - 0 - - -  Suicidal thoughts - 0 - - -  PHQ-9 Score - 6 - - -  Difficult doing work/chores - Not difficult at all - - -    Relevant past medical, surgical, family and social history reviewed Past Medical History:  Diagnosis Date  . Arthritis   . Cataract   . CKD (chronic kidney disease) stage 2, GFR 60-89 ml/min   . Diabetes mellitus without complication ([place])   . Hyperlipidemia   . Hypertension   . Morbid obesity ([place])   . Osteoporosis March 2015  . Proteinuria Jan 2015   referred to Nephrology   Past Surgical History:  Procedure Laterality Date  . DILATION AND CURETTAGE OF UTERUS  2007  . MOUTH SURGERY  2007   cyst  . TONSILLECTOMY     Family History  Problem Relation Age of Onset  . Glaucoma Mother   . Cancer Father     liver  . Cancer Sister     lung, bone  . COPD Sister   . Heart disease Maternal Grandmother     chf  . Hypertension Brother   . Hypertension Maternal Uncle   . Ovarian cancer Paternal Grandmother   . Cancer Paternal Grandmother     ovarian cancer  . Breast cancer Neg Hx    Social History  Substance Use Topics  . Smoking  status: Never Smoker  . Smokeless tobacco: Never Used  . Alcohol use No    Interim medical history since last visit reviewed. Allergies and medications reviewed  Review of Systems Per HPI unless specifically indicated above     Objective:    BP 138/76   Pulse 98   Temp 98.2 F (36.8 C) (Oral)   Resp 16   Wt 284 lb 11.2 oz (129.1 kg)   SpO2 94%   BMI 49.25 kg/m   Wt Readings from Last 3 Encounters:  01/04/17 284 lb 11.2 oz (129.1 kg)  06/06/16 279 lb 8 oz (126.8 kg)  03/06/16 277 lb (125.6 kg)    Physical Exam  Constitutional: She appears well-developed and well-nourished. No distress.  Morbidly obese, weight concentrated around buttocks, hips, thighs  Eyes: EOM are normal. No scleral icterus.  Neck: No thyromegaly present.  Cardiovascular: Normal rate and regular rhythm.   Pulmonary/Chest: Effort normal and breath sounds normal.  Abdominal: Soft. Bowel sounds are normal. She exhibits no distension.  Musculoskeletal: Normal  range of motion. She exhibits no edema.       Lumbar back: She exhibits no tenderness, no bony tenderness, no edema and no spasm.  Neurological: She is alert. She exhibits normal muscle tone.  Skin: Skin is warm and dry. She is not diaphoretic. No pallor.  Psychiatric: She has a normal mood and affect. Her behavior is normal. Judgment and thought content normal. Her mood appears not anxious. She does not exhibit a depressed mood.   Diabetic Foot Form - Detailed   Diabetic Foot Exam - detailed Diabetic Foot exam was performed with the following findings:  Yes 01/04/2017  9:21 PM  Visual Foot Exam completed.:  Yes  Are the toenails ingrown?:  No Pulse Foot Exam completed.:  Yes  Right Dorsalis Pedis:  Present Left Dorsalis Pedis:  Present  Sensory Foot Exam Completed.:  Yes Swelling:  No Semmes-Weinstein Monofilament Test R Site 1-Great Toe:  Pos L Site 1-Great Toe:  Pos  R Site 4:  Pos L Site 4:  Pos  R Site 5:  Pos L Site 5:  Pos          Assessment & Plan:   Problem List Items Addressed This Visit      Cardiovascular and Mediastinum   Hypertension    Controlled; encourage DASH guidelines        Endocrine   Diabetes mellitus with proliferative retinopathy ([place])    Foot exam by MD; check A1c, urine microalbumin; eye exam UTD; encouragement given      Relevant Orders   Hemoglobin A1c (Completed)     Musculoskeletal and Integument   Neoplasm of uncertain behavior of skin of face    Right cheek; refer to derm      Relevant Orders   Ambulatory referral to Dermatology   Dermatitis    Refer to dermatologist; she declined offer for TAC today      Relevant Orders   Ambulatory referral to Dermatology     Genitourinary   CKD (chronic kidney disease) stage 2, GFR 60-89 ml/min    Avoid NSAIDs; hydrated; check Cr and GFR        Other   Proteinuria    Recheck urine today; already saw Dr. Holley Raring (nephro)      Relevant Orders   Microalbumin / creatinine urine ratio  (Completed)   Morbid obesity (Rule)    Patient is not motivated to change; encouragement given, nonetheless  Medication monitoring encounter    Check liver and kidneys      Relevant Orders   Comprehensive Metabolic Panel (CMET) (Completed)   Hyperlipidemia    Check lipids; limit saturated fats      Relevant Orders   Lipid panel (Completed)       Follow up plan: Return in about 3 months (around 04/06/2017) for fasting labs and visit.  An after-visit summary was printed and given to the patient at [place].  Please see the patient instructions which may contain other information and recommendations beyond what is mentioned above in the assessment and plan.  No orders of the defined types were placed in this encounter.   Orders Placed This Encounter  Procedures  . Hemoglobin A1c  . Lipid panel  . Microalbumin / creatinine urine ratio  . Comprehensive Metabolic Panel (CMET)  . Ambulatory referral to Dermatology

## 2017-01-04 NOTE — Assessment & Plan Note (Signed)
Check liver and kidneys 

## 2017-01-04 NOTE — Assessment & Plan Note (Signed)
Avoid NSAIDs; hydrated; check Cr and GFR

## 2017-01-04 NOTE — Assessment & Plan Note (Signed)
Refer to dermatologist; she declined offer for TAC today

## 2017-01-04 NOTE — Assessment & Plan Note (Signed)
Check lipids; limit saturated fats 

## 2017-01-04 NOTE — Patient Instructions (Signed)
Try to limit saturated fats in your diet (bologna, hot dogs, barbeque, cheeseburgers, hamburgers, steak, bacon, sausage, cheese, etc.) and get more fresh fruits, vegetables, and whole grains Check out the information at familydoctor.org entitled "Nutrition for Weight Loss: What You Need to Know about Fad Diets" Try to lose between 1-2 pounds per week by taking in fewer calories and burning off more calories You can succeed by limiting portions, limiting foods dense in calories and fat, becoming more active, and drinking 8 glasses of water a day (64 ounces) Don't skip meals, especially breakfast, as skipping meals may alter your metabolism Do not use over-the-counter weight loss pills or gimmicks that claim rapid weight loss A healthy BMI (or body mass index) is between 18.5 and 24.9 You can calculate your ideal BMI at the NIH website http://www.nhlbi.nih.gov/health/educational/lose_wt/BMI/bmicalc.htm Please do see your eye doctor regularly, and have your eyes examined every year (or more often per his or her recommendation) Check your feet every night and let me know right away of any sores, infections, numbness, etc. Try to limit sweets, white bread, white rice, white potatoes It is okay with me for you to not check your fingerstick blood sugars (per American College of Endocrinology Best Practices), unless you are interested and feel it would be helpful for you  

## 2017-01-05 LAB — LIPID PANEL
CHOLESTEROL TOTAL: 157 mg/dL (ref 100–199)
Chol/HDL Ratio: 3.5 ratio units (ref 0.0–4.4)
HDL: 45 mg/dL (ref >39)
LDL Calculated: 93 mg/dL (ref 0–99)
Triglycerides: 95 mg/dL (ref 0–149)
VLDL Cholesterol Cal: 19 mg/dL (ref 5–40)

## 2017-01-05 LAB — COMPREHENSIVE METABOLIC PANEL
ALK PHOS: 95 IU/L (ref 39–117)
ALT: 15 IU/L (ref 0–32)
AST: 13 IU/L (ref 0–40)
Albumin/Globulin Ratio: 1.5 (ref 1.2–2.2)
Albumin: 4 g/dL (ref 3.6–4.8)
BUN / CREAT RATIO: 21 (ref 12–28)
BUN: 17 mg/dL (ref 8–27)
Bilirubin Total: 0.4 mg/dL (ref 0.0–1.2)
CALCIUM: 9.5 mg/dL (ref 8.7–10.3)
CO2: 25 mmol/L (ref 18–29)
CREATININE: 0.82 mg/dL (ref 0.57–1.00)
Chloride: 101 mmol/L (ref 96–106)
GFR calc Af Amer: 85 mL/min/{1.73_m2} (ref >59)
GFR, EST NON AFRICAN AMERICAN: 74 mL/min/{1.73_m2} (ref >59)
Globulin, Total: 2.7 g/dL (ref 1.5–4.5)
Glucose: 198 mg/dL — ABNORMAL HIGH (ref 65–99)
Potassium: 4 mmol/L (ref 3.5–5.2)
Sodium: 140 mmol/L (ref 134–144)
Total Protein: 6.7 g/dL (ref 6.0–8.5)

## 2017-01-05 LAB — HEMOGLOBIN A1C
ESTIMATED AVERAGE GLUCOSE: 220 mg/dL
HEMOGLOBIN A1C: 9.3 % — AB (ref 4.8–5.6)

## 2017-01-05 LAB — MICROALBUMIN / CREATININE URINE RATIO
Creatinine, Urine: 159.5 mg/dL
MICROALBUM., U, RANDOM: 917.7 ug/mL
Microalb/Creat Ratio: 575.4 mg/g creat — ABNORMAL HIGH (ref 0.0–30.0)

## 2017-01-11 ENCOUNTER — Other Ambulatory Visit: Payer: Self-pay | Admitting: Family Medicine

## 2017-01-11 DIAGNOSIS — R808 Other proteinuria: Secondary | ICD-10-CM

## 2017-01-11 NOTE — Assessment & Plan Note (Signed)
Refer back to nephrology

## 2017-01-21 NOTE — Assessment & Plan Note (Signed)
Patient is not motivated to change; encouragement given, nonetheless

## 2017-01-24 DIAGNOSIS — E113311 Type 2 diabetes mellitus with moderate nonproliferative diabetic retinopathy with macular edema, right eye: Secondary | ICD-10-CM | POA: Diagnosis not present

## 2017-01-31 DIAGNOSIS — E113212 Type 2 diabetes mellitus with mild nonproliferative diabetic retinopathy with macular edema, left eye: Secondary | ICD-10-CM | POA: Diagnosis not present

## 2017-02-06 DIAGNOSIS — L918 Other hypertrophic disorders of the skin: Secondary | ICD-10-CM | POA: Diagnosis not present

## 2017-02-06 DIAGNOSIS — L853 Xerosis cutis: Secondary | ICD-10-CM | POA: Diagnosis not present

## 2017-02-06 DIAGNOSIS — L821 Other seborrheic keratosis: Secondary | ICD-10-CM | POA: Diagnosis not present

## 2017-02-06 DIAGNOSIS — L570 Actinic keratosis: Secondary | ICD-10-CM | POA: Diagnosis not present

## 2017-02-06 DIAGNOSIS — D1800 Hemangioma unspecified site: Secondary | ICD-10-CM | POA: Diagnosis not present

## 2017-02-06 DIAGNOSIS — D485 Neoplasm of uncertain behavior of skin: Secondary | ICD-10-CM | POA: Diagnosis not present

## 2017-02-23 ENCOUNTER — Other Ambulatory Visit: Payer: Self-pay | Admitting: Family Medicine

## 2017-02-28 DIAGNOSIS — E113311 Type 2 diabetes mellitus with moderate nonproliferative diabetic retinopathy with macular edema, right eye: Secondary | ICD-10-CM | POA: Diagnosis not present

## 2017-03-04 DIAGNOSIS — E113212 Type 2 diabetes mellitus with mild nonproliferative diabetic retinopathy with macular edema, left eye: Secondary | ICD-10-CM | POA: Diagnosis not present

## 2017-03-18 ENCOUNTER — Other Ambulatory Visit: Payer: Self-pay | Admitting: Family Medicine

## 2017-03-19 DIAGNOSIS — L570 Actinic keratosis: Secondary | ICD-10-CM | POA: Diagnosis not present

## 2017-04-04 DIAGNOSIS — E113211 Type 2 diabetes mellitus with mild nonproliferative diabetic retinopathy with macular edema, right eye: Secondary | ICD-10-CM | POA: Diagnosis not present

## 2017-04-05 ENCOUNTER — Ambulatory Visit: Payer: Medicare Other | Admitting: Family Medicine

## 2017-04-08 DIAGNOSIS — E113212 Type 2 diabetes mellitus with mild nonproliferative diabetic retinopathy with macular edema, left eye: Secondary | ICD-10-CM | POA: Diagnosis not present

## 2017-04-23 ENCOUNTER — Ambulatory Visit (INDEPENDENT_AMBULATORY_CARE_PROVIDER_SITE_OTHER): Payer: Medicare Other | Admitting: Family Medicine

## 2017-04-23 ENCOUNTER — Encounter: Payer: Self-pay | Admitting: Family Medicine

## 2017-04-23 DIAGNOSIS — E782 Mixed hyperlipidemia: Secondary | ICD-10-CM

## 2017-04-23 DIAGNOSIS — E113513 Type 2 diabetes mellitus with proliferative diabetic retinopathy with macular edema, bilateral: Secondary | ICD-10-CM

## 2017-04-23 DIAGNOSIS — I10 Essential (primary) hypertension: Secondary | ICD-10-CM | POA: Diagnosis not present

## 2017-04-23 DIAGNOSIS — N182 Chronic kidney disease, stage 2 (mild): Secondary | ICD-10-CM | POA: Diagnosis not present

## 2017-04-23 DIAGNOSIS — Z5181 Encounter for therapeutic drug level monitoring: Secondary | ICD-10-CM | POA: Diagnosis not present

## 2017-04-23 DIAGNOSIS — M810 Age-related osteoporosis without current pathological fracture: Secondary | ICD-10-CM | POA: Diagnosis not present

## 2017-04-23 NOTE — Patient Instructions (Addendum)
I recommend no more than 3 egg yolks per week Try turmeric as a natural anti-inflammatory (for pain and arthritis). It comes in capsules where you buy aspirin and fish oil, but also as a spice where you buy pepper and garlic powder. Keep up the good efforts with weight loss and better eating

## 2017-04-23 NOTE — Assessment & Plan Note (Signed)
Check lipids; limit saturated fats; no more than 3 egg yolks per week

## 2017-04-23 NOTE — Assessment & Plan Note (Signed)
Limit salt, try DASH guidelines

## 2017-04-23 NOTE — Progress Notes (Signed)
BP (!) 146/64   Pulse 93   Temp 97.9 F (36.6 C) (Oral)   Resp 14   Wt 277 lb 12.8 oz (126 kg)   SpO2 93%   BMI 48.06 kg/m    Subjective:    Patient ID: Julie [name], female    DOB: Mar 29, 1946, 71 y.o.   MRN: [id number]  HPI: Julie [name] is a 71 y.o. female  Chief Complaint  Patient presents with  . Follow-up   HPI She has fallen once since last visit, feels like the knees give way; legs aren't strong; will think about PT but declined referral today  Type 2 diabetes; last A1c was high; did great for a few weeks Lab Results  Component Value Date   HGBA1C 9.3 (H) 01/04/2017  nurse practiionter checked it at her home and it was 7 even Thinks it is up now because she isn't as good now as she was doing the first 6-8 weeks; she cut out  Only 3 oreos and one ice cream sandiwich one week Weight went down 1/4 to 1/2 each week, but skipping off lately; back eating white bread now Husband eats same as wife, not having to plan two meals  HTN; top number 140 range; "salt's been bad for me"; ankles not too swollen, left is always more swollen than right  High cholesterol; room for improvement; she read in [place] that she should be eating egg yolks  Obesity; down 7 pounds since last visit; she said she actually lost more than that, really watched things after last visit, then fell off wagon recently  CKD; sees Dr. Holley Raring; knows to avoid NSAIDs; using tylenol if needed for pain  Osteoporosis; had DEXA in Dec 2017; afraid to go on pills  Depression screen [facility] 2/9 04/23/2017 01/04/2017 06/06/2016 03/06/2016 03/06/2016  Decreased Interest 0 0 0 0 0  Down, Depressed, Hopeless 0 1 1 1  0  PHQ - 2 Score 0 1 1 1  0  Altered sleeping - - 3 - -  Tired, decreased energy - - 0 - -  Change in appetite - - 2 - -  Feeling bad or failure about yourself  - - 0 - -  Trouble concentrating - - 0 - -  Moving slowly or fidgety/restless - - 0 - -  Suicidal thoughts - - 0 - -  PHQ-9 Score - - 6 -  -  Difficult doing work/chores - - Not difficult at all - -    Relevant past medical, surgical, family and social history reviewed Past Medical History:  Diagnosis Date  . Arthritis   . Cataract   . CKD (chronic kidney disease) stage 2, GFR 60-89 ml/min   . Diabetes mellitus without complication ([place])   . Hyperlipidemia   . Hypertension   . Morbid obesity ([place])   . Osteoporosis March 2015  . Proteinuria Jan 2015   referred to Nephrology   Past Surgical History:  Procedure Laterality Date  . DILATION AND CURETTAGE OF UTERUS  2007  . MOUTH SURGERY  2007   cyst  . TONSILLECTOMY     Family History  Problem Relation Age of Onset  . Glaucoma Mother   . Cancer Father        liver  . Cancer Sister        lung, bone  . COPD Sister   . Heart disease Maternal Grandmother        chf  . Hypertension Brother   . Hypertension Maternal  Uncle   . Ovarian cancer Paternal Grandmother   . Cancer Paternal Grandmother        ovarian cancer  . Breast cancer Neg Hx    Social History   Social History  . Marital status: Married    Spouse name: N/A  . Number of children: N/A  . Years of education: N/A   Occupational History  . Not on file.   Social History Main Topics  . Smoking status: Never Smoker  . Smokeless tobacco: Never Used  . Alcohol use No  . Drug use: No  . Sexual activity: Not Currently   Other Topics Concern  . Not on file   Social History Narrative  . No narrative on file    Interim medical history since last visit reviewed. Allergies and medications reviewed  Review of Systems Per HPI unless specifically indicated above     Objective:    BP (!) 146/64   Pulse 93   Temp 97.9 F (36.6 C) (Oral)   Resp 14   Wt 277 lb 12.8 oz (126 kg)   SpO2 93%   BMI 48.06 kg/m   Wt Readings from Last 3 Encounters:  04/23/17 277 lb 12.8 oz (126 kg)  01/04/17 284 lb 11.2 oz (129.1 kg)  06/06/16 279 lb 8 oz (126.8 kg)    Physical Exam  Constitutional: She  appears well-developed and well-nourished. No distress.  Morbidly obese, down 7 pounds over last 3+ months; weight concentrated around buttocks, hips, thighs  Eyes: EOM are normal. No scleral icterus.  Neck: No thyromegaly present.  Cardiovascular: Normal rate and regular rhythm.   Pulmonary/Chest: Effort normal and breath sounds normal.  Abdominal: Soft. Bowel sounds are normal. She exhibits no distension.  Musculoskeletal: Normal range of motion. She exhibits no edema.       Lumbar back: She exhibits no tenderness, no bony tenderness, no edema and no spasm.  Neurological: She is alert.  Skin: Skin is warm and dry. She is not diaphoretic. No pallor.  Psychiatric: She has a normal mood and affect. Her behavior is normal. Judgment and thought content normal. Her mood appears not anxious. She does not exhibit a depressed mood.   Diabetic Foot Form - Detailed   Diabetic Foot Exam - detailed Diabetic Foot exam was performed with the following findings:  Yes 04/23/2017  2:56 PM  Visual Foot Exam completed.:  Yes  Normal Range of Motion:  Yes Pulse Foot Exam completed.:  Yes  Right Dorsalis Pedis:  Present Left Dorsalis Pedis:  Present  Sensory Foot Exam Completed.:  Yes Semmes-Weinstein Monofilament Test R Site 1-Great Toe:  Pos L Site 1-Great Toe:  Pos        Results for orders placed or performed in visit on 01/04/17  Hemoglobin A1c  Result Value Ref Range   Hgb A1c MFr Bld 9.3 (H) 4.8 - 5.6 %   Est. average glucose Bld gHb Est-mCnc 220 mg/dL  Lipid panel  Result Value Ref Range   Cholesterol, Total 157 100 - 199 mg/dL   Triglycerides 95 0 - 149 mg/dL   HDL 45 >39 mg/dL   VLDL Cholesterol Cal 19 5 - 40 mg/dL   LDL Calculated 93 0 - 99 mg/dL   Chol/HDL Ratio 3.5 0.0 - 4.4 ratio units  Microalbumin / creatinine urine ratio  Result Value Ref Range   Creatinine, Urine 159.5 Not Estab. mg/dL   Albumin, Urine 917.7 Not Estab. ug/mL   Microalb/Creat Ratio 575.4 (H) 0.0 - 30.0  mg/g  creat  Comprehensive Metabolic Panel (CMET)  Result Value Ref Range   Glucose 198 (H) 65 - 99 mg/dL   BUN 17 8 - 27 mg/dL   Creatinine, Ser 0.82 0.57 - 1.00 mg/dL   GFR calc non Af Amer 74 >59 mL/min/1.73   GFR calc Af Amer 85 >59 mL/min/1.73   BUN/Creatinine Ratio 21 12 - 28   Sodium 140 134 - 144 mmol/L   Potassium 4.0 3.5 - 5.2 mmol/L   Chloride 101 96 - 106 mmol/L   CO2 25 18 - 29 mmol/L   Calcium 9.5 8.7 - 10.3 mg/dL   Total Protein 6.7 6.0 - 8.5 g/dL   Albumin 4.0 3.6 - 4.8 g/dL   Globulin, Total 2.7 1.5 - 4.5 g/dL   Albumin/Globulin Ratio 1.5 1.2 - 2.2   Bilirubin Total 0.4 0.0 - 1.2 mg/dL   Alkaline Phosphatase 95 39 - 117 IU/L   AST 13 0 - 40 IU/L   ALT 15 0 - 32 IU/L      Assessment & Plan:   Problem List Items Addressed This Visit      Cardiovascular and Mediastinum   Hypertension    Limit salt, try DASH guidelines        Endocrine   Diabetes mellitus with proliferative retinopathy ([place])    Foot exam by MD; check A1c; encouragement given to get back on track      Relevant Orders   Hemoglobin A1c   Lipid panel     Musculoskeletal and Integument   Osteoporosis    Refer to rheum      Relevant Orders   Ambulatory referral to Rheumatology     Genitourinary   CKD (chronic kidney disease) stage 2, GFR 60-89 ml/min    Avoid NSAIDs, hydrated, check Cr        Other   Medication monitoring encounter    Check labs      Relevant Orders   Comprehensive metabolic panel   Hyperlipidemia    Check lipids; limit saturated fats; no more than 3 egg yolks per week      Relevant Orders   Lipid panel       Follow up plan: Return in about 3 months (around 07/24/2017) for twenty minute follow-up with fasting labs.  An after-visit summary was printed and given to the patient at [place].  Please see the patient instructions which may contain other information and recommendations beyond what is mentioned above in the assessment and plan.  No orders of the  defined types were placed in this encounter.   Orders Placed This Encounter  Procedures  . Comprehensive metabolic panel  . Hemoglobin A1c  . Lipid panel  . Ambulatory referral to Rheumatology

## 2017-04-23 NOTE — Assessment & Plan Note (Signed)
Avoid NSAIDs, hydrated, check Cr

## 2017-04-23 NOTE — Assessment & Plan Note (Signed)
Check labs 

## 2017-04-23 NOTE — Assessment & Plan Note (Signed)
Foot exam by MD; check A1c; encouragement given to get back on track

## 2017-04-23 NOTE — Assessment & Plan Note (Signed)
Refer to rheum 

## 2017-04-24 LAB — HEMOGLOBIN A1C
ESTIMATED AVERAGE GLUCOSE: 163 mg/dL
HEMOGLOBIN A1C: 7.3 % — AB (ref 4.8–5.6)

## 2017-04-24 LAB — LIPID PANEL
CHOLESTEROL TOTAL: 159 mg/dL (ref 100–199)
Chol/HDL Ratio: 3.5 ratio (ref 0.0–4.4)
HDL: 45 mg/dL (ref >39)
LDL Calculated: 97 mg/dL (ref 0–99)
TRIGLYCERIDES: 87 mg/dL (ref 0–149)
VLDL Cholesterol Cal: 17 mg/dL (ref 5–40)

## 2017-04-24 LAB — COMPREHENSIVE METABOLIC PANEL
ALBUMIN: 4.1 g/dL (ref 3.6–4.8)
ALK PHOS: 91 IU/L (ref 39–117)
ALT: 16 IU/L (ref 0–32)
AST: 15 IU/L (ref 0–40)
Albumin/Globulin Ratio: 1.5 (ref 1.2–2.2)
BUN / CREAT RATIO: 23 (ref 12–28)
BUN: 19 mg/dL (ref 8–27)
Bilirubin Total: 0.4 mg/dL (ref 0.0–1.2)
CO2: 25 mmol/L (ref 20–29)
CREATININE: 0.81 mg/dL (ref 0.57–1.00)
Calcium: 9.7 mg/dL (ref 8.7–10.3)
Chloride: 102 mmol/L (ref 96–106)
GFR calc Af Amer: 86 mL/min/{1.73_m2} (ref >59)
GFR calc non Af Amer: 75 mL/min/{1.73_m2} (ref >59)
GLUCOSE: 173 mg/dL — AB (ref 65–99)
Globulin, Total: 2.7 g/dL (ref 1.5–4.5)
Potassium: 4.4 mmol/L (ref 3.5–5.2)
Sodium: 141 mmol/L (ref 134–144)
Total Protein: 6.8 g/dL (ref 6.0–8.5)

## 2017-04-25 ENCOUNTER — Telehealth: Payer: Self-pay

## 2017-04-25 DIAGNOSIS — M810 Age-related osteoporosis without current pathological fracture: Secondary | ICD-10-CM

## 2017-04-25 NOTE — Telephone Encounter (Signed)
Patient called states rheumatology called states they want her to see Endo for the osteoporosis.  Please advise?

## 2017-04-25 NOTE — Telephone Encounter (Signed)
That's fine Thank you I put in a new referral

## 2017-04-25 NOTE — Assessment & Plan Note (Signed)
Refer to endo 

## 2017-05-06 DIAGNOSIS — E113311 Type 2 diabetes mellitus with moderate nonproliferative diabetic retinopathy with macular edema, right eye: Secondary | ICD-10-CM | POA: Diagnosis not present

## 2017-05-08 DIAGNOSIS — E113212 Type 2 diabetes mellitus with mild nonproliferative diabetic retinopathy with macular edema, left eye: Secondary | ICD-10-CM | POA: Diagnosis not present

## 2017-06-03 ENCOUNTER — Telehealth: Payer: Self-pay | Admitting: Family Medicine

## 2017-06-03 DIAGNOSIS — R195 Other fecal abnormalities: Secondary | ICD-10-CM

## 2017-06-03 NOTE — Telephone Encounter (Signed)
Please let pt know that her stool test was positive, so we recommend that she see a gastroenterologist for a colonoscopy; I have entered the referral

## 2017-06-03 NOTE — Telephone Encounter (Signed)
Patient wants to discuss with you at next appt

## 2017-06-03 NOTE — Telephone Encounter (Signed)
Patient notified transferred up front to schedule an appt

## 2017-06-03 NOTE — Telephone Encounter (Signed)
Patient does not have any upcoming appointments scheduled Please ask her to come in this week to go over this test; this is serious Thank you

## 2017-06-04 ENCOUNTER — Encounter: Payer: Self-pay | Admitting: Family Medicine

## 2017-06-05 ENCOUNTER — Other Ambulatory Visit: Payer: Self-pay | Admitting: Family Medicine

## 2017-06-06 ENCOUNTER — Ambulatory Visit: Payer: Medicare Other | Admitting: Family Medicine

## 2017-06-07 DIAGNOSIS — E113212 Type 2 diabetes mellitus with mild nonproliferative diabetic retinopathy with macular edema, left eye: Secondary | ICD-10-CM | POA: Diagnosis not present

## 2017-06-13 DIAGNOSIS — E113211 Type 2 diabetes mellitus with mild nonproliferative diabetic retinopathy with macular edema, right eye: Secondary | ICD-10-CM | POA: Diagnosis not present

## 2017-06-19 ENCOUNTER — Ambulatory Visit (INDEPENDENT_AMBULATORY_CARE_PROVIDER_SITE_OTHER): Payer: Medicare Other | Admitting: General Surgery

## 2017-06-19 ENCOUNTER — Encounter: Payer: Self-pay | Admitting: General Surgery

## 2017-06-19 VITALS — BP 142/72 | HR 93 | Resp 14 | Ht 64.0 in | Wt 274.0 lb

## 2017-06-19 DIAGNOSIS — R195 Other fecal abnormalities: Secondary | ICD-10-CM | POA: Diagnosis not present

## 2017-06-19 MED ORDER — POLYETHYLENE GLYCOL 3350 17 GM/SCOOP PO POWD: ORAL | 0 refills | Status: DC

## 2017-06-19 NOTE — Patient Instructions (Addendum)
Colonoscopy, Adult A colonoscopy is an exam to look at the entire large intestine. During the exam, a lubricated, bendable tube is inserted into the anus and then passed into the rectum, colon, and other parts of the large intestine. A colonoscopy is often done as a part of normal colorectal screening or in response to certain symptoms, such as anemia, persistent diarrhea, abdominal pain, and blood in the stool. The exam can help screen for and diagnose medical problems, including:  Tumors.  Polyps.  Inflammation.  Areas of bleeding.  Tell a health care provider about:  Any allergies you have.  All medicines you are taking, including vitamins, herbs, eye drops, creams, and over-the-counter medicines.  Any problems you or family members have had with anesthetic medicines.  Any blood disorders you have.  Any surgeries you have had.  Any medical conditions you have.  Any problems you have had passing stool. What are the risks? Generally, this is a safe procedure. However, problems may occur, including:  Bleeding.  A tear in the intestine.  A reaction to medicines given during the exam.  Infection (rare).  What happens before the procedure? Eating and drinking restrictions Follow instructions from your health care provider about eating and drinking, which may include:  A few days before the procedure - follow a low-fiber diet. Avoid nuts, seeds, dried fruit, raw fruits, and vegetables.  1-3 days before the procedure - follow a clear liquid diet. Drink only clear liquids, such as clear broth or bouillon, black coffee or tea, clear juice, clear soft drinks or sports drinks, gelatin dessert, and popsicles. Avoid any liquids that contain red or purple dye.  On the day of the procedure - do not eat or drink anything during the 2 hours before the procedure, or within the time period that your health care provider recommends.  Bowel prep If you were prescribed an oral bowel prep  to clean out your colon:  Take it as told by your health care provider. Starting the day before your procedure, you will need to drink a large amount of medicated liquid. The liquid will cause you to have multiple loose stools until your stool is almost clear or light green.  If your skin or anus gets irritated from diarrhea, you may use these to relieve the irritation: ? Medicated wipes, such as adult wet wipes with aloe and vitamin E. ? A skin soothing-product like petroleum jelly.  If you vomit while drinking the bowel prep, take a break for up to 60 minutes and then begin the bowel prep again. If vomiting continues and you cannot take the bowel prep without vomiting, call your health care provider.  General instructions  Ask your health care provider about changing or stopping your regular medicines. This is especially important if you are taking diabetes medicines or blood thinners.  Plan to have someone take you home from the hospital or clinic. What happens during the procedure?  An IV tube may be inserted into one of your veins.  You will be given medicine to help you relax (sedative).  To reduce your risk of infection: ? Your health care team will wash or sanitize their hands. ? Your anal area will be washed with soap.  You will be asked to lie on your side with your knees bent.  Your health care provider will lubricate a long, thin, flexible tube. The tube will have a camera and a light on the end.  The tube will be inserted into your   anus.  The tube will be gently eased through your rectum and colon.  Air will be delivered into your colon to keep it open. You may feel some pressure or cramping.  The camera will be used to take images during the procedure.  A small tissue sample may be removed from your body to be examined under a microscope (biopsy). If any potential problems are found, the tissue will be sent to a lab for testing.  If small polyps are found, your  health care provider may remove them and have them checked for cancer cells.  The tube that was inserted into your anus will be slowly removed. The procedure may vary among health care providers and hospitals. What happens after the procedure?  Your blood pressure, heart rate, breathing rate, and blood oxygen level will be monitored until the medicines you were given have worn off.  Do not drive for 24 hours after the exam.  You may have a small amount of blood in your stool.  You may pass gas and have mild abdominal cramping or bloating due to the air that was used to inflate your colon during the exam.  It is up to you to get the results of your procedure. Ask your health care provider, or the department performing the procedure, when your results will be ready. This information is not intended to replace advice given to you by your health care provider. Make sure you discuss any questions you have with your health care provider. Document Released: 09/28/2000 Document Revised: 08/01/2016 Document Reviewed: 12/13/2015 Elsevier Interactive Patient Education  2018 [employer]  

## 2017-06-19 NOTE — Progress Notes (Signed)
Patient ID: Julie [name], female   DOB: [birth year], 71 y.o.   MRN: [id number]  Chief Complaint  Patient presents with  . Colonoscopy    HPI Julie [name] is a 71 y.o. female Here today for a evaluation of a screening colonoscopy. Moves her bowel every other day. No family history of colon cancer. Patient state she has failed a stool sample for the last two years, positive for blood. HPI  Past Medical History:  Diagnosis Date  . Arthritis   . Cataract   . CKD (chronic kidney disease) stage 2, GFR 60-89 ml/min   . Diabetes mellitus without complication ([place])   . Hyperlipidemia   . Hypertension   . Morbid obesity ([place])   . Osteoporosis March 2015  . Proteinuria Jan 2015   referred to Nephrology    Past Surgical History:  Procedure Laterality Date  . DILATION AND CURETTAGE OF UTERUS  2007  . MOUTH SURGERY  2007   cyst  . TONSILLECTOMY      Family History  Problem Relation Age of Onset  . Glaucoma Mother   . Cancer Father        liver  . Cancer Sister        lung, bone  . COPD Sister   . Heart disease Maternal Grandmother        chf  . Hypertension Brother   . Hypertension Maternal Uncle   . Ovarian cancer Paternal Grandmother   . Cancer Paternal Grandmother        ovarian cancer  . Breast cancer Neg Hx     Social History Social History  Substance Use Topics  . Smoking status: Never Smoker  . Smokeless tobacco: Never Used  . Alcohol use No    Allergies  Allergen Reactions  . Nsaids Other (See Comments)  . Penicillins Other (See Comments)    Not sure of reaction     Current Outpatient Prescriptions  Medication Sig Dispense Refill  . amLODipine (NORVASC) 10 MG tablet TAKE 1 TABLET BY MOUTH  DAILY 90 tablet 3  . aspirin 81 MG tablet Take 81 mg by mouth daily.    Marland Kitchen atorvastatin (LIPITOR) 10 MG tablet TAKE 1 TABLET BY MOUTH AT  BEDTIME 90 tablet 1  . Cholecalciferol (VITAMIN D-1000 MAX ST) 1000 UNITS tablet Take 1,000 Units by mouth daily.    Marland Kitchen  glipiZIDE (GLUCOTROL XL) 10 MG 24 hr tablet TAKE 1 TABLET BY MOUTH  TWICE A DAY 180 tablet 1  . hydrochlorothiazide (HYDRODIURIL) 25 MG tablet TAKE 1 TABLET BY MOUTH  DAILY 90 tablet 1  . lisinopril (PRINIVIL,ZESTRIL) 40 MG tablet TAKE 1 TABLET BY MOUTH  DAILY 90 tablet 1  . metFORMIN (GLUCOPHAGE) 500 MG tablet TAKE 2 TABLETS BY MOUTH TWO TIMES DAILY 360 tablet 1  . Omega-3 Fatty Acids (FISH OIL) 1000 MG CAPS Take 1,000 mg by mouth 2 (two) times daily.    . polyethylene glycol powder (GLYCOLAX/MIRALAX) powder 255 grams one bottle for colonoscopy prep 255 g 0   Current Facility-Administered Medications  Medication Dose Route Frequency Provider Last Rate Last Dose  . triamcinolone acetonide (KENALOG) 10 MG/ML injection 10 mg  10 mg Other Once [name], DPM        Review of Systems Review of Systems  Constitutional: Negative.   Respiratory: Negative.   Cardiovascular: Negative.   Gastrointestinal: Positive for constipation.    Blood pressure (!) 142/72, pulse 93, resp. rate 14, height 5\' 4"  (1.626 m), weight  274 lb (124.3 kg).  Physical Exam Physical Exam  Constitutional: She is oriented to person, place, and time. She appears well-developed and well-nourished.  Cardiovascular: Normal rate and regular rhythm.   Murmur heard.  Systolic murmur is present with a grade of 2/6  Pulmonary/Chest: Effort normal and breath sounds normal.  Neurological: She is alert and oriented to person, place, and time.  Skin: Skin is warm and dry.    Data Reviewed Fecal occult blood test of 05/16/2017 was positive.  Comprehensive metabolic panel dated 30/16/0109 was notable for blood sugar 173. Creatinine 0.81. Normal electrolytes and liver function studies.  Hemoglobin A1c of the same date was 7.3.  Assessment    Heme positive stools.    Plan    Indications for upper and lower endoscopy reviewed.  No previous CBC results. We'll arrange for this to be done as an outpatient prior to the  procedure.  The patient reports a history of modest constipation with bowel movements twice a week. Will add the use of [facility] locks 2 days prior to the formal MiraLAX prep to help provide for a clean exam. She'll discontinue her metformin the day of the MiraLAX prep but continue glipizide.     Colonoscopy with possible biopsy/polypectomy prn: Information regarding the procedure, including its potential risks and complications (including but not limited to perforation of the bowel, which may require emergency surgery to repair, and bleeding) was verbally given to the patient. Educational information regarding lower intestinal endoscopy was given to the patient. Written instructions for how to complete the bowel prep using Miralax were provided. The importance of drinking ample fluids to avoid dehydration as a result of the prep emphasized.  HPI, Physical Exam, Assessment and Plan have been scribed under the direction and in the presence of Hervey Ard, MD.  Gaspar Cola, CMA  I have completed the exam and reviewed the above documentation for accuracy and completeness.  I agree with the above.  Haematologist has been used and any errors in dictation or transcription are unintentional.  Hervey Ard, M.D., F.A.C.S.  Robert Bellow 06/21/2017, 1:29 PM  Patient has been scheduled for an upper and lower endoscopy on 07-16-17 at [facility]. Miralax prescription has been sent in to the patient's pharmacy today. In addition to Miralax prep, patient has been asked to take Dulcolax 2 -5 mg tablets the morning and afternoon day prior to prep. It is okay for patient to continue an 81 mg aspirin once daily. This patient has been asked to discontinue fish oil one week prior to procedure. This patient has been asked to hold metformin day of colonoscopy prep and procedure. It is okay for patient to continue glipizide day before colonoscopy. She will only take her blood pressure medication the morning of  procedure. Colonoscopy instructions have been reviewed with the patient. This patient is aware to call the office if they have further questions.   Dominga Ferry, CMA

## 2017-06-21 ENCOUNTER — Other Ambulatory Visit: Payer: Self-pay | Admitting: General Surgery

## 2017-06-21 DIAGNOSIS — R195 Other fecal abnormalities: Secondary | ICD-10-CM

## 2017-06-25 ENCOUNTER — Other Ambulatory Visit: Payer: Self-pay

## 2017-06-25 DIAGNOSIS — R195 Other fecal abnormalities: Secondary | ICD-10-CM

## 2017-07-03 ENCOUNTER — Telehealth: Payer: Self-pay

## 2017-07-03 DIAGNOSIS — R195 Other fecal abnormalities: Secondary | ICD-10-CM | POA: Diagnosis not present

## 2017-07-03 NOTE — Telephone Encounter (Signed)
Message left for the patient to remind her of having her labs drawn before the 28th of this month.

## 2017-07-04 LAB — CBC WITH DIFFERENTIAL/PLATELET
BASOS: 0 %
Basophils Absolute: 0 10*3/uL (ref 0.0–0.2)
EOS (ABSOLUTE): 0.6 10*3/uL — AB (ref 0.0–0.4)
Eos: 7 %
HEMOGLOBIN: 13.6 g/dL (ref 11.1–15.9)
Hematocrit: 41.5 % (ref 34.0–46.6)
IMMATURE GRANS (ABS): 0 10*3/uL (ref 0.0–0.1)
Immature Granulocytes: 0 %
LYMPHS: 27 %
Lymphocytes Absolute: 2.3 10*3/uL (ref 0.7–3.1)
MCH: 27.1 pg (ref 26.6–33.0)
MCHC: 32.8 g/dL (ref 31.5–35.7)
MCV: 83 fL (ref 79–97)
MONOCYTES: 7 %
Monocytes Absolute: 0.6 10*3/uL (ref 0.1–0.9)
NEUTROS PCT: 59 %
Neutrophils Absolute: 5.1 10*3/uL (ref 1.4–7.0)
PLATELETS: 184 10*3/uL (ref 150–379)
RBC: 5.02 x10E6/uL (ref 3.77–5.28)
RDW: 17.5 % — ABNORMAL HIGH (ref 12.3–15.4)
WBC: 8.6 10*3/uL (ref 3.4–10.8)

## 2017-07-09 DIAGNOSIS — E113311 Type 2 diabetes mellitus with moderate nonproliferative diabetic retinopathy with macular edema, right eye: Secondary | ICD-10-CM | POA: Diagnosis not present

## 2017-07-09 DIAGNOSIS — E113212 Type 2 diabetes mellitus with mild nonproliferative diabetic retinopathy with macular edema, left eye: Secondary | ICD-10-CM | POA: Diagnosis not present

## 2017-07-09 LAB — HM DIABETES EYE EXAM

## 2017-07-16 ENCOUNTER — Ambulatory Visit
Admission: RE | Admit: 2017-07-16 | Discharge: 2017-07-16 | Disposition: A | Discharge status: Home / Self Care | Payer: Medicare Other | Source: Ambulatory Visit | Attending: General Surgery | Admitting: General Surgery

## 2017-07-16 ENCOUNTER — Encounter
Admission: RE | Disposition: A | Discharge status: Home / Self Care | Payer: Self-pay | Source: Ambulatory Visit | Attending: General Surgery

## 2017-07-16 ENCOUNTER — Ambulatory Visit: Payer: Medicare Other | Admitting: Anesthesiology

## 2017-07-16 ENCOUNTER — Encounter: Payer: Self-pay | Admitting: *Deleted

## 2017-07-16 DIAGNOSIS — R195 Other fecal abnormalities: Secondary | ICD-10-CM | POA: Insufficient documentation

## 2017-07-16 DIAGNOSIS — E119 Type 2 diabetes mellitus without complications: Secondary | ICD-10-CM | POA: Insufficient documentation

## 2017-07-16 DIAGNOSIS — D123 Benign neoplasm of transverse colon: Secondary | ICD-10-CM | POA: Insufficient documentation

## 2017-07-16 DIAGNOSIS — K5730 Diverticulosis of large intestine without perforation or abscess without bleeding: Secondary | ICD-10-CM | POA: Diagnosis not present

## 2017-07-16 DIAGNOSIS — K2970 Gastritis, unspecified, without bleeding: Secondary | ICD-10-CM | POA: Diagnosis not present

## 2017-07-16 DIAGNOSIS — D122 Benign neoplasm of ascending colon: Secondary | ICD-10-CM | POA: Insufficient documentation

## 2017-07-16 DIAGNOSIS — D126 Benign neoplasm of colon, unspecified: Secondary | ICD-10-CM | POA: Diagnosis not present

## 2017-07-16 DIAGNOSIS — K635 Polyp of colon: Secondary | ICD-10-CM | POA: Diagnosis not present

## 2017-07-16 DIAGNOSIS — D125 Benign neoplasm of sigmoid colon: Secondary | ICD-10-CM | POA: Insufficient documentation

## 2017-07-16 DIAGNOSIS — K2950 Unspecified chronic gastritis without bleeding: Secondary | ICD-10-CM | POA: Insufficient documentation

## 2017-07-16 DIAGNOSIS — I10 Essential (primary) hypertension: Secondary | ICD-10-CM | POA: Diagnosis not present

## 2017-07-16 DIAGNOSIS — K5790 Diverticulosis of intestine, part unspecified, without perforation or abscess without bleeding: Secondary | ICD-10-CM | POA: Diagnosis not present

## 2017-07-16 HISTORY — PX: ESOPHAGOGASTRODUODENOSCOPY (EGD) WITH PROPOFOL: SHX5813

## 2017-07-16 HISTORY — PX: COLONOSCOPY WITH PROPOFOL: SHX5780

## 2017-07-16 LAB — GLUCOSE, CAPILLARY: Glucose-Capillary: 196 mg/dL — ABNORMAL HIGH (ref 65–99)

## 2017-07-16 SURGERY — ESOPHAGOGASTRODUODENOSCOPY (EGD) WITH PROPOFOL
Anesthesia: General

## 2017-07-16 MED ORDER — LIDOCAINE 2% (20 MG/ML) 5 ML SYRINGE
INTRAMUSCULAR | Status: DC | PRN
  Administered 2017-07-16: 40 mg via INTRAVENOUS

## 2017-07-16 MED ORDER — SODIUM CHLORIDE 0.9 % IV SOLN
INTRAVENOUS | Status: DC
  Administered 2017-07-16: 1000 mL via INTRAVENOUS

## 2017-07-16 MED ORDER — PROPOFOL 500 MG/50ML IV EMUL
INTRAVENOUS | Status: AC
  Filled 2017-07-16: qty 100

## 2017-07-16 MED ORDER — SODIUM CHLORIDE 0.9 % IJ SOLN
INTRAMUSCULAR | Status: AC
  Filled 2017-07-16: qty 20

## 2017-07-16 MED ORDER — PROPOFOL 500 MG/50ML IV EMUL
INTRAVENOUS | Status: DC | PRN
  Administered 2017-07-16: 160 ug/kg/min via INTRAVENOUS

## 2017-07-16 MED ORDER — FENTANYL CITRATE (PF) 100 MCG/2ML IJ SOLN
INTRAMUSCULAR | Status: AC
  Filled 2017-07-16: qty 2

## 2017-07-16 MED ORDER — PROPOFOL 10 MG/ML IV BOLUS
INTRAVENOUS | Status: DC | PRN
  Administered 2017-07-16: 100 mg via INTRAVENOUS

## 2017-07-16 MED ORDER — PROPOFOL 10 MG/ML IV BOLUS
INTRAVENOUS | Status: AC
  Filled 2017-07-16: qty 20

## 2017-07-16 MED ORDER — FENTANYL CITRATE (PF) 100 MCG/2ML IJ SOLN
INTRAMUSCULAR | Status: DC | PRN
  Administered 2017-07-16 (×2): 50 ug via INTRAVENOUS

## 2017-07-16 MED ORDER — LIDOCAINE HCL (PF) 2 % IJ SOLN
INTRAMUSCULAR | Status: AC
  Filled 2017-07-16: qty 10

## 2017-07-16 MED ORDER — MIDAZOLAM HCL 5 MG/5ML IJ SOLN
INTRAMUSCULAR | Status: DC | PRN
  Administered 2017-07-16 (×2): 1 mg via INTRAVENOUS

## 2017-07-16 MED ORDER — MIDAZOLAM HCL 2 MG/2ML IJ SOLN
INTRAMUSCULAR | Status: AC
  Filled 2017-07-16: qty 2

## 2017-07-16 NOTE — H&P (Signed)
Heme positive stools. For upper and lower endoscopy. No change in clinical history since office visit.

## 2017-07-16 NOTE — Anesthesia Preprocedure Evaluation (Signed)
Anesthesia Evaluation  Patient identified by MRN, date of birth, ID band Patient awake    Reviewed: Allergy & Precautions, NPO status , Patient's Chart, lab work & pertinent test results  Airway Mallampati: III       Dental  (+) Teeth Intact   Pulmonary neg pulmonary ROS,     + decreased breath sounds      Cardiovascular Exercise Tolerance: Good hypertension, Pt. on medications + Peripheral Vascular Disease   Rhythm:Regular Rate:Normal     Neuro/Psych negative neurological ROS  negative psych ROS   GI/Hepatic negative GI ROS, Neg liver ROS,   Endo/Other  diabetes, Type 2, Oral Hypoglycemic AgentsMorbid obesity  Renal/GU      Musculoskeletal   Abdominal (+) + obese,   Peds  Hematology negative hematology ROS (+)   Anesthesia Other Findings   Reproductive/Obstetrics                             Anesthesia Physical Anesthesia Plan  ASA: III  Anesthesia Plan: General   Post-op Pain Management:    Induction: Intravenous  PONV Risk Score and Plan: 0  Airway Management Planned: Natural Airway and Nasal Cannula  Additional Equipment:   Intra-op Plan:   Post-operative Plan:   Informed Consent: I have reviewed the patients History and Physical, chart, labs and discussed the procedure including the risks, benefits and alternatives for the proposed anesthesia with the patient or authorized representative who has indicated his/her understanding and acceptance.     Plan Discussed with: CRNA  Anesthesia Plan Comments:         Anesthesia Quick Evaluation

## 2017-07-16 NOTE — Transfer of Care (Signed)
Immediate Anesthesia Transfer of Care Note  Patient: Julie [name]  Procedure(s) Performed: ESOPHAGOGASTRODUODENOSCOPY (EGD) WITH PROPOFOL (N/A ) COLONOSCOPY WITH PROPOFOL (N/A )  Patient Location: PACU and Endoscopy Unit  Anesthesia Type:General  Level of Consciousness: awake and drowsy  Airway & Oxygen Therapy: Patient Spontanous Breathing and Patient connected to nasal cannula oxygen  Post-op Assessment: Report given to RN and Post -op Vital signs reviewed and stable  Post vital signs: Reviewed and stable  Last Vitals:  Vitals:   07/16/17 1233  BP: (!) 145/60  Pulse: 95  Resp: 20  Temp: (!) 36.3 C  SpO2: 96%    Last Pain:  Vitals:   07/16/17 1233  TempSrc: Tympanic      Patients Stated Pain Goal: 0 (14/78/29 5621)  Complications: No apparent anesthesia complications

## 2017-07-16 NOTE — Anesthesia Post-op Follow-up Note (Signed)
Anesthesia QCDR form completed.        

## 2017-07-16 NOTE — Op Note (Signed)
[facility] Patient Name: Julie [name] Procedure Date: 07/16/2017 1:28 PM MRN: [id number] Account #: [id number] Date of Birth: 11/01/1945 Admit Type: Outpatient Age: 71 Room: [facility] ENDO ROOM 1 Gender: Female Note Status: Finalized Procedure:            Upper GI endoscopy Indications:          Occult blood in stool Providers:            Robert Bellow, MD Referring MD:         Arnetha Courser (Referring MD) Medicines:            Monitored Anesthesia Care Complications:        No immediate complications. Procedure:            Pre-Anesthesia Assessment:                       - Prior to the procedure, a History and Physical was                        performed, and patient medications, allergies and                        sensitivities were reviewed. The patient's tolerance of                        previous anesthesia was reviewed.                       - The risks and benefits of the procedure and the                        sedation options and risks were discussed with the                        patient. All questions were answered and informed                        consent was obtained.                       After obtaining informed consent, the endoscope was                        passed under direct vision. Throughout the procedure,                        the patient's blood pressure, pulse, and oxygen                        saturations were monitored continuously. The Endoscope                        was introduced through the mouth, and advanced to the                        second part of duodenum. The upper GI endoscopy was                        accomplished without difficulty. The patient tolerated  the procedure well. Findings:      The esophagus was normal.      The examined duodenum was normal.      Localized mild inflammation characterized by erythema was found in the       prepyloric region of the  stomach. Impression:           - Normal esophagus.                       - Normal examined duodenum.                       - Chronic gastritis.                       - No specimens collected. Recommendation:       - Perform a colonoscopy tomorrow. Procedure Code(s):    --- Professional ---                       [phone], Esophagogastroduodenoscopy, flexible, transoral;                        diagnostic, including collection of specimen(s) by                        brushing or washing, when performed (separate procedure) Diagnosis Code(s):    --- Professional ---                       K29.50, Unspecified chronic gastritis without bleeding                       R19.5, Other fecal abnormalities CPT copyright 2016 American Medical Association. All rights reserved. The codes documented in this report are preliminary and upon coder review may  be revised to meet current compliance requirements. Robert Bellow, MD 07/16/2017 1:45:24 PM This report has been signed electronically. Number of Addenda: 0 Note Initiated On: 07/16/2017 1:28 PM      [facility]

## 2017-07-16 NOTE — Op Note (Signed)
[facility] Patient Name: Julie [name] Procedure Date: 07/16/2017 1:27 PM MRN: [id number] Account #: [id number] Date of Birth: 01/19/1946 Admit Type: Outpatient Age: 71 Room: [facility] ENDO ROOM 1 Gender: Female Note Status: Finalized Procedure:            Colonoscopy Indications:          Gastrointestinal occult blood loss Providers:            Robert Bellow, MD Referring MD:         Arnetha Courser (Referring MD) Medicines:            Monitored Anesthesia Care Complications:        No immediate complications. Procedure:            Pre-Anesthesia Assessment:                       - Prior to the procedure, a History and Physical was                        performed, and patient medications, allergies and                        sensitivities were reviewed. The patient's tolerance of                        previous anesthesia was reviewed.                       - The risks and benefits of the procedure and the                        sedation options and risks were discussed with the                        patient. All questions were answered and informed                        consent was obtained.                       After obtaining informed consent, the colonoscope was                        passed under direct vision. Throughout the procedure,                        the patient's blood pressure, pulse, and oxygen                        saturations were monitored continuously. The                        Colonoscope was introduced through the anus and                        advanced to the the terminal ileum. The colonoscopy was                        somewhat difficult due to a tortuous colon. Successful  completion of the procedure was aided by using manual                        pressure. The patient tolerated the procedure well. The                        quality of the bowel preparation was excellent. Findings:       A single small-mouthed diverticulum was found in the sigmoid colon.      Three sessile polyps were found in the sigmoid colon, transverse colon       and ascending colon. The polyps were 5 mm in size. These were biopsied       with a cold forceps for histology.      The retroflexed view of the distal rectum and anal verge was normal and       showed no anal or rectal abnormalities. Impression:           - Diverticulosis in the sigmoid colon.                       - Three 5 mm polyps in the sigmoid colon, in the                        transverse colon and in the ascending colon. Biopsied.                       - The distal rectum and anal verge are normal on                        retroflexion view. Recommendation:       - Telephone endoscopist for pathology results in 1 week. Procedure Code(s):    --- Professional ---                       [id number], Colonoscopy, flexible; with biopsy, single or                        multiple Diagnosis Code(s):    --- Professional ---                       D12.5, Benign neoplasm of sigmoid colon                       D12.3, Benign neoplasm of transverse colon (hepatic                        flexure or splenic flexure)                       D12.2, Benign neoplasm of ascending colon                       R19.5, Other fecal abnormalities                       K57.30, Diverticulosis of large intestine without                        perforation or abscess without bleeding CPT copyright 2016 American Medical Association. All rights reserved. The codes documented in this report are preliminary and upon  coder review may  be revised to meet current compliance requirements. Robert Bellow, MD 07/16/2017 2:16:38 PM This report has been signed electronically. Number of Addenda: 0 Note Initiated On: 07/16/2017 1:27 PM Scope Withdrawal Time: 0 hours 14 minutes 58 seconds  Total Procedure Duration: 0 hours 26 minutes 1 second       [facility]

## 2017-07-17 ENCOUNTER — Telehealth: Payer: Self-pay

## 2017-07-17 ENCOUNTER — Other Ambulatory Visit: Payer: Self-pay

## 2017-07-17 ENCOUNTER — Encounter: Payer: Self-pay | Admitting: General Surgery

## 2017-07-17 DIAGNOSIS — R195 Other fecal abnormalities: Secondary | ICD-10-CM

## 2017-07-17 NOTE — Telephone Encounter (Signed)
-----   Message from Robert Bellow, MD sent at 07/16/2017  2:32 PM EDT ----- Please arrange for SBFT in next few weeks, re: heme positive stools. Patient had EGD/Colon today and is aware you will be calling this week.

## 2017-07-17 NOTE — Anesthesia Postprocedure Evaluation (Signed)
Anesthesia Post Note  Patient: Julie [name]  Procedure(s) Performed: ESOPHAGOGASTRODUODENOSCOPY (EGD) WITH PROPOFOL (N/A ) COLONOSCOPY WITH PROPOFOL (N/A )  Patient location during evaluation: PACU Anesthesia Type: General Level of consciousness: awake Pain management: pain level controlled Vital Signs Assessment: post-procedure vital signs reviewed and stable Respiratory status: spontaneous breathing Cardiovascular status: stable Anesthetic complications: no     Last Vitals:  Vitals:   07/16/17 1439 07/16/17 1449  BP: 114/63 125/64  Pulse:    Resp:    Temp:    SpO2:      Last Pain:  Vitals:   07/16/17 1419  TempSrc: Tympanic                 VAN STAVEREN,[name]

## 2017-07-17 NOTE — Telephone Encounter (Signed)
The patient is scheduled for a small bowel follow thru at [facility] on 07/24/17 at 10:30 am. She will arrive by 10:15 am and have nothing to eat or drink for 3 hours prior. The patient is aware of date, time, and instructions.

## 2017-07-18 DIAGNOSIS — E113311 Type 2 diabetes mellitus with moderate nonproliferative diabetic retinopathy with macular edema, right eye: Secondary | ICD-10-CM | POA: Diagnosis not present

## 2017-07-18 LAB — SURGICAL PATHOLOGY

## 2017-07-19 ENCOUNTER — Telehealth: Payer: Self-pay

## 2017-07-19 NOTE — Telephone Encounter (Signed)
Notified patient as instructed, patient pleased. Discussed follow-up appointments, patient agrees  

## 2017-07-19 NOTE — Telephone Encounter (Signed)
-----   Message from Robert Bellow, MD sent at 07/19/2017  5:59 AM EDT ----- Please notify all polyps OK, but a repeat exam in five years is indicated. Thanks.  ----- Message ----- From: Interface, Lab In Three Zero One Sent: 07/18/2017   3:04 PM To: Robert Bellow, MD

## 2017-07-24 ENCOUNTER — Ambulatory Visit
Admission: RE | Admit: 2017-07-24 | Discharge: 2017-07-24 | Disposition: A | Discharge status: Home / Self Care | Payer: Medicare Other | Source: Ambulatory Visit | Attending: General Surgery | Admitting: General Surgery

## 2017-07-24 DIAGNOSIS — R195 Other fecal abnormalities: Secondary | ICD-10-CM | POA: Diagnosis not present

## 2017-07-25 ENCOUNTER — Telehealth: Payer: Self-pay

## 2017-07-25 NOTE — Telephone Encounter (Signed)
Message left for the patient to call back for results.   

## 2017-07-25 NOTE — Telephone Encounter (Signed)
-----   Message from Robert Bellow, MD sent at 07/25/2017  8:58 AM EDT ----- Please notify the patient that I reviewed yesterday's x-ray and no abnormalities are identified.  No additional diagnostic testing is required at this time. She should call if she has any questions or concerns.  Thank you ----- Message ----- From: Interface, Rad Results In Sent: 07/24/2017   1:16 PM To: Robert Bellow, MD

## 2017-07-25 NOTE — Telephone Encounter (Signed)
Notified patient as instructed, patient pleased. Discussed follow-up appointments, patient agrees and will call with any further problems.

## 2017-07-30 DIAGNOSIS — M810 Age-related osteoporosis without current pathological fracture: Secondary | ICD-10-CM | POA: Diagnosis not present

## 2017-07-30 DIAGNOSIS — Z8781 Personal history of (healed) traumatic fracture: Secondary | ICD-10-CM | POA: Diagnosis not present

## 2017-08-14 ENCOUNTER — Other Ambulatory Visit: Payer: Self-pay | Admitting: Family Medicine

## 2017-08-15 DIAGNOSIS — E113212 Type 2 diabetes mellitus with mild nonproliferative diabetic retinopathy with macular edema, left eye: Secondary | ICD-10-CM | POA: Diagnosis not present

## 2017-08-22 DIAGNOSIS — E113311 Type 2 diabetes mellitus with moderate nonproliferative diabetic retinopathy with macular edema, right eye: Secondary | ICD-10-CM | POA: Diagnosis not present

## 2017-09-20 DIAGNOSIS — E113212 Type 2 diabetes mellitus with mild nonproliferative diabetic retinopathy with macular edema, left eye: Secondary | ICD-10-CM | POA: Diagnosis not present

## 2017-09-30 DIAGNOSIS — E113311 Type 2 diabetes mellitus with moderate nonproliferative diabetic retinopathy with macular edema, right eye: Secondary | ICD-10-CM | POA: Diagnosis not present

## 2017-10-25 DIAGNOSIS — E113212 Type 2 diabetes mellitus with mild nonproliferative diabetic retinopathy with macular edema, left eye: Secondary | ICD-10-CM | POA: Diagnosis not present

## 2017-11-04 DIAGNOSIS — E113311 Type 2 diabetes mellitus with moderate nonproliferative diabetic retinopathy with macular edema, right eye: Secondary | ICD-10-CM | POA: Diagnosis not present

## 2017-11-09 ENCOUNTER — Other Ambulatory Visit: Payer: Self-pay | Admitting: Family Medicine

## 2017-11-11 NOTE — Telephone Encounter (Signed)
Please call patient; she was due to be seen around October 10th She has not upcoming appointments Please find out if she has transferred care If not, please schedule appt first available and I'll refill meds Thank you

## 2017-11-11 NOTE — Telephone Encounter (Signed)
LVM for pt to call and schedule an appt °

## 2017-11-14 ENCOUNTER — Other Ambulatory Visit: Payer: Self-pay

## 2017-11-14 MED ORDER — LISINOPRIL 40 MG PO TABS: 40.0000 mg | ORAL_TABLET | Freq: Every day | ORAL | 0 refills | Status: DC

## 2017-11-14 MED ORDER — METFORMIN HCL 500 MG PO TABS: ORAL_TABLET | ORAL | 0 refills | Status: DC

## 2017-11-14 MED ORDER — ATORVASTATIN CALCIUM 10 MG PO TABS: 10.0000 mg | ORAL_TABLET | Freq: Every day | ORAL | 0 refills | Status: DC

## 2017-11-14 MED ORDER — HYDROCHLOROTHIAZIDE 25 MG PO TABS: 25.0000 mg | ORAL_TABLET | Freq: Every day | ORAL | 0 refills | Status: DC

## 2017-11-14 NOTE — Telephone Encounter (Signed)
Patient does have an appt scheduled; one refill sent

## 2017-11-14 NOTE — Telephone Encounter (Signed)
Pt not seen since 04/23/2017. Please advise

## 2017-11-28 ENCOUNTER — Ambulatory Visit: Payer: Medicare Other | Admitting: Family Medicine

## 2017-11-29 DIAGNOSIS — E113212 Type 2 diabetes mellitus with mild nonproliferative diabetic retinopathy with macular edema, left eye: Secondary | ICD-10-CM | POA: Diagnosis not present

## 2017-11-30 NOTE — Progress Notes (Signed)
Signing off on abstraction note

## 2017-11-30 NOTE — Telephone Encounter (Signed)
Patient has an appointment scheduled for 12/06/17 Signing off

## 2017-11-30 NOTE — Progress Notes (Signed)
Closing out lab/order note open since:  9/17

## 2017-11-30 NOTE — Progress Notes (Signed)
Scanned FIT Patient already referred; document addressed

## 2017-12-06 ENCOUNTER — Encounter: Payer: Self-pay | Admitting: Family Medicine

## 2017-12-06 ENCOUNTER — Ambulatory Visit (INDEPENDENT_AMBULATORY_CARE_PROVIDER_SITE_OTHER): Payer: Medicare Other | Admitting: Family Medicine

## 2017-12-06 VITALS — BP 132/80 | HR 95 | Temp 97.5°F | Wt 282.4 lb

## 2017-12-06 DIAGNOSIS — Z5181 Encounter for therapeutic drug level monitoring: Secondary | ICD-10-CM

## 2017-12-06 DIAGNOSIS — I10 Essential (primary) hypertension: Secondary | ICD-10-CM | POA: Diagnosis not present

## 2017-12-06 DIAGNOSIS — N182 Chronic kidney disease, stage 2 (mild): Secondary | ICD-10-CM

## 2017-12-06 DIAGNOSIS — E113513 Type 2 diabetes mellitus with proliferative diabetic retinopathy with macular edema, bilateral: Secondary | ICD-10-CM

## 2017-12-06 DIAGNOSIS — B353 Tinea pedis: Secondary | ICD-10-CM

## 2017-12-06 NOTE — Assessment & Plan Note (Signed)
Check liver and kidneys 

## 2017-12-06 NOTE — Assessment & Plan Note (Signed)
Fair control; limit salt intake; weight loss encouraged

## 2017-12-06 NOTE — Assessment & Plan Note (Signed)
Avoid NSAIDs; check Cr and urine microalbumin

## 2017-12-06 NOTE — Assessment & Plan Note (Signed)
Encouragement given; read about Victoza

## 2017-12-06 NOTE — Assessment & Plan Note (Signed)
Foot exam by MD; eye exam UTD; check A1c on another day (she'll return)

## 2017-12-06 NOTE — Progress Notes (Signed)
BP 132/80 (BP Location: Right Arm, Patient Position: Sitting, Cuff Size: Large)   Pulse 95   Temp (!) 97.5 F (36.4 C) (Oral)   Wt 282 lb 6.4 oz (128.1 kg)   SpO2 99%   BMI 48.47 kg/m    Subjective:    Patient ID: Julie [name], female    DOB: 04/27/1946, 72 y.o.   MRN: [id number]  HPI: Julie [name] is a 72 y.o. female  Chief Complaint  Patient presents with  . Medication Refill  . Follow-up    HPI Patient is here for f/u Type 2 DM; not doing her own FSBS with my blessing; vision has been bad for years; going for injections with Dr. George Ina (ophth), getting better; seeing dentist yearly; not drinking sugary drinks  High cholesterol Trying to stay away from the fatty meats; more eggs and we'll see if any change  HTN Sometimes checks BP away from the doctor; adding some salt to some foods  CKD Has not seen kidney specialist for a few years; no NSAIDs  Depression screen [facility] 2/9 12/06/2017 04/23/2017 01/04/2017 06/06/2016 03/06/2016  Decreased Interest 0 0 0 0 0  Down, Depressed, Hopeless 0 0 1 1 1   PHQ - 2 Score 0 0 1 1 1   Altered sleeping - - - 3 -  Tired, decreased energy - - - 0 -  Change in appetite - - - 2 -  Feeling bad or failure about yourself  - - - 0 -  Trouble concentrating - - - 0 -  Moving slowly or fidgety/restless - - - 0 -  Suicidal thoughts - - - 0 -  PHQ-9 Score - - - 6 -  Difficult doing work/chores - - - Not difficult at all -    Relevant past medical, surgical, family and social history reviewed Past Medical History:  Diagnosis Date  . Arthritis   . Cataract   . CKD (chronic kidney disease) stage 2, GFR 60-89 ml/min   . Diabetes mellitus without complication ([place])   . Hyperlipidemia   . Hypertension   . Morbid obesity ([place])   . Osteoporosis March 2015  . Proteinuria Jan 2015   referred to Nephrology   Past Surgical History:  Procedure Laterality Date  . COLONOSCOPY WITH PROPOFOL N/A 07/16/2017   Procedure: COLONOSCOPY WITH  PROPOFOL;  Surgeon: Robert Bellow, MD;  Location: [facility] ENDOSCOPY;  Service: Endoscopy;  Laterality: N/A;  . DILATION AND CURETTAGE OF UTERUS  2007  . ESOPHAGOGASTRODUODENOSCOPY (EGD) WITH PROPOFOL N/A 07/16/2017   Procedure: ESOPHAGOGASTRODUODENOSCOPY (EGD) WITH PROPOFOL;  Surgeon: Robert Bellow, MD;  Location: [facility] ENDOSCOPY;  Service: Endoscopy;  Laterality: N/A;  . MOUTH SURGERY  2007   cyst  . TONSILLECTOMY     Family History  Problem Relation Age of Onset  . Glaucoma Mother   . Cancer Father        liver  . Cancer Sister        lung, bone  . COPD Sister   . Heart disease Maternal Grandmother        chf  . Hypertension Brother   . Hypertension Maternal Uncle   . Ovarian cancer Paternal Grandmother   . Cancer Paternal Grandmother        ovarian cancer  . Breast cancer Neg Hx    Social History   Tobacco Use  . Smoking status: Never Smoker  . Smokeless tobacco: Never Used  Substance Use Topics  . Alcohol use: No  .  Drug use: No    Interim medical history since last visit reviewed. Allergies and medications reviewed  Review of Systems Per HPI unless specifically indicated above     Objective:    BP 132/80 (BP Location: Right Arm, Patient Position: Sitting, Cuff Size: Large)   Pulse 95   Temp (!) 97.5 F (36.4 C) (Oral)   Wt 282 lb 6.4 oz (128.1 kg)   SpO2 99%   BMI 48.47 kg/m   Wt Readings from Last 3 Encounters:  12/06/17 282 lb 6.4 oz (128.1 kg)  07/16/17 270 lb (122.5 kg)  06/19/17 274 lb (124.3 kg)    Physical Exam  Constitutional: She appears well-developed and well-nourished. No distress.  Morbidly obese; gain of 12+ pounds over last 4+ months; weight concentrated around buttocks, hips, thighs  Eyes: EOM are normal. No scleral icterus.  Neck: No thyromegaly present.  Cardiovascular: Normal rate and regular rhythm.  Pulmonary/Chest: Effort normal and breath sounds normal.  Abdominal: Soft. Bowel sounds are normal. She exhibits no  distension.  Musculoskeletal: Normal range of motion. She exhibits no edema.       Lumbar back: She exhibits no tenderness, no bony tenderness, no edema and no spasm.  Neurological: She is alert.  Skin: Skin is warm and dry. She is not diaphoretic. No pallor.  Psychiatric: She has a normal mood and affect. Her behavior is normal. Judgment and thought content normal. Her mood appears not anxious. She does not exhibit a depressed mood.   Diabetic Foot Form - Detailed   Diabetic Foot Exam - detailed Diabetic Foot exam was performed with the following findings:  Yes 12/06/2017  3:17 PM  Visual Foot Exam completed.:  Yes  Pulse Foot Exam completed.:  Yes  Right Dorsalis Pedis:  Present Left Dorsalis Pedis:  Present  Sensory Foot Exam Completed.:  Yes Semmes-Weinstein Monofilament Test R Site 1-Great Toe:  Pos L Site 1-Great Toe:  Pos    Comments:  Fine scale along sides of medial right foot       Assessment & Plan:   Problem List Items Addressed This Visit      Cardiovascular and Mediastinum   Hypertension    Fair control; limit salt intake; weight loss encouraged        Endocrine   Diabetes mellitus with proliferative retinopathy ([place]) - Primary    Foot exam by MD; eye exam UTD; check A1c on another day (she'll return)      Relevant Orders   Hemoglobin A1c   Lipid panel (Completed)   Hemoglobin A1c (Completed)   Microalbumin / creatinine urine ratio (Completed)     Genitourinary   CKD (chronic kidney disease) stage 2, GFR 60-89 ml/min    Avoid NSAIDs; check Cr and urine microalbumin        Other   Morbid obesity ([place])    Encouragement given; read about Victoza      Medication monitoring encounter    Check liver and kidneys      Relevant Orders   Comprehensive metabolic panel (Completed)    Other Visit Diagnoses    Tinea pedis of both feet       try tolnaftate       Follow up plan: Return in about 3 months (around 03/13/2018).  An after-visit summary was  printed and given to the patient at [facility].  Please see the patient instructions which may contain other information and recommendations beyond what is mentioned above in the assessment and plan.  No orders  of the defined types were placed in this encounter.   Orders Placed This Encounter  Procedures  . Hemoglobin A1c  . Lipid panel  . Hemoglobin A1c  . Microalbumin / creatinine urine ratio  . Comprehensive metabolic panel

## 2017-12-06 NOTE — Patient Instructions (Addendum)
You can try tolnaftate for the feet Keep feet aired out Try to limit saturated fats in your diet (bologna, hot dogs, barbeque, cheeseburgers, hamburgers, steak, bacon, sausage, cheese, etc.) and get more fresh fruits, vegetables, and whole grains Read about Victoza which can help control your diabetes and help you lose weight

## 2017-12-09 DIAGNOSIS — E113513 Type 2 diabetes mellitus with proliferative diabetic retinopathy with macular edema, bilateral: Secondary | ICD-10-CM | POA: Diagnosis not present

## 2017-12-09 DIAGNOSIS — Z5181 Encounter for therapeutic drug level monitoring: Secondary | ICD-10-CM | POA: Diagnosis not present

## 2017-12-09 DIAGNOSIS — E113311 Type 2 diabetes mellitus with moderate nonproliferative diabetic retinopathy with macular edema, right eye: Secondary | ICD-10-CM | POA: Diagnosis not present

## 2017-12-10 LAB — HEMOGLOBIN A1C
Est. average glucose Bld gHb Est-mCnc: 212 mg/dL
HEMOGLOBIN A1C: 9 % — AB (ref 4.8–5.6)

## 2017-12-10 LAB — MICROALBUMIN / CREATININE URINE RATIO
CREATININE, UR: 150.4 mg/dL
MICROALB/CREAT RATIO: 1411.2 mg/g{creat} — AB (ref 0.0–30.0)
MICROALBUM., U, RANDOM: 2122.5 ug/mL

## 2017-12-10 LAB — COMPREHENSIVE METABOLIC PANEL
A/G RATIO: 1.5 (ref 1.2–2.2)
ALK PHOS: 83 IU/L (ref 39–117)
ALT: 15 IU/L (ref 0–32)
AST: 13 IU/L (ref 0–40)
Albumin: 3.7 g/dL (ref 3.6–4.8)
BILIRUBIN TOTAL: 0.3 mg/dL (ref 0.0–1.2)
BUN/Creatinine Ratio: 21 (ref 12–28)
BUN: 16 mg/dL (ref 8–27)
CHLORIDE: 103 mmol/L (ref 96–106)
CO2: 25 mmol/L (ref 20–29)
Calcium: 9.3 mg/dL (ref 8.7–10.3)
Creatinine, Ser: 0.77 mg/dL (ref 0.57–1.00)
GFR calc Af Amer: 91 mL/min/{1.73_m2} (ref >59)
GFR calc non Af Amer: 79 mL/min/{1.73_m2} (ref >59)
GLOBULIN, TOTAL: 2.4 g/dL (ref 1.5–4.5)
Glucose: 193 mg/dL — ABNORMAL HIGH (ref 65–99)
POTASSIUM: 4.3 mmol/L (ref 3.5–5.2)
SODIUM: 143 mmol/L (ref 134–144)
Total Protein: 6.1 g/dL (ref 6.0–8.5)

## 2017-12-10 LAB — LIPID PANEL
CHOLESTEROL TOTAL: 150 mg/dL (ref 100–199)
Chol/HDL Ratio: 3.8 ratio (ref 0.0–4.4)
HDL: 40 mg/dL (ref >39)
LDL Calculated: 89 mg/dL (ref 0–99)
Triglycerides: 105 mg/dL (ref 0–149)
VLDL Cholesterol Cal: 21 mg/dL (ref 5–40)

## 2017-12-18 ENCOUNTER — Telehealth: Payer: Self-pay | Admitting: Family Medicine

## 2017-12-18 NOTE — Telephone Encounter (Signed)
Left pt voicemail that Dr. lada just sent in 90 day supply on 1/31, no need for refill at this time please contact pharmacy

## 2017-12-18 NOTE — Telephone Encounter (Signed)
Copied from [place] [id number]. Topic: Quick Communication - Rx Refill/Question >> Dec 18, 2017  9:36 AM Synthia Innocent wrote: Medication: lisinopril (PRINIVIL,ZESTRIL) 40 MG tablet , hydrochlorothiazide (HYDRODIURIL) 25 MG tablet, atorvastatin (LIPITOR) 10 MG tablet and metFORMIN (GLUCOPHAGE) 500 MG tablet Ref #[id number]  Has the patient contacted their pharmacy? Yes.     (Agent: If no, request that the patient contact the pharmacy for the refill.)   Preferred Pharmacy (with phone number or street name): [facility]   Agent: Please be advised that RX refills may take up to 3 business days. We ask that you follow-up with your pharmacy.

## 2017-12-20 ENCOUNTER — Telehealth: Payer: Self-pay | Admitting: Family Medicine

## 2017-12-20 NOTE — Telephone Encounter (Signed)
Copied from [place] [id number]. Topic: Quick Communication - See Telephone Encounter >> Dec 20, 2017  2:56 PM Arletha Grippe wrote: CRM for notification. See Telephone encounter for:   12/20/17. Pt called to let us know that she does not want to try any new medications right now for her a1c, she is going to try to get it down by other ways. If any questions, please call [phone]

## 2017-12-20 NOTE — Telephone Encounter (Signed)
Thank you so much

## 2017-12-20 NOTE — Telephone Encounter (Signed)
Please see the bottom documentation

## 2017-12-20 NOTE — Telephone Encounter (Signed)
Copied from [place]. Topic: Referral - Medical Records >> Dec 20, 2017  2:51 PM Arletha Grippe wrote: Reason for CRM: pt needs to have med records incl most recent labs sent to Dr. Tama High at  [address], [place], [place] [address], [place]   Phone number [phone]  Pt has appt 01/22/18   The requested information has been printed and faxed to Dr. Anthonette Legato with [facility]).

## 2018-01-03 DIAGNOSIS — E113212 Type 2 diabetes mellitus with mild nonproliferative diabetic retinopathy with macular edema, left eye: Secondary | ICD-10-CM | POA: Diagnosis not present

## 2018-01-22 DIAGNOSIS — E113311 Type 2 diabetes mellitus with moderate nonproliferative diabetic retinopathy with macular edema, right eye: Secondary | ICD-10-CM | POA: Diagnosis not present

## 2018-01-25 ENCOUNTER — Other Ambulatory Visit: Payer: Self-pay | Admitting: Family Medicine

## 2018-01-27 ENCOUNTER — Other Ambulatory Visit: Payer: Self-pay | Admitting: Family Medicine

## 2018-01-27 DIAGNOSIS — Z1231 Encounter for screening mammogram for malignant neoplasm of breast: Secondary | ICD-10-CM

## 2018-02-03 DIAGNOSIS — E113212 Type 2 diabetes mellitus with mild nonproliferative diabetic retinopathy with macular edema, left eye: Secondary | ICD-10-CM | POA: Diagnosis not present

## 2018-02-12 ENCOUNTER — Ambulatory Visit
Admission: RE | Admit: 2018-02-12 | Discharge: 2018-02-12 | Disposition: A | Discharge status: Home / Self Care | Payer: Medicare Other | Source: Ambulatory Visit | Attending: Family Medicine | Admitting: Family Medicine

## 2018-02-12 DIAGNOSIS — Z1231 Encounter for screening mammogram for malignant neoplasm of breast: Secondary | ICD-10-CM

## 2018-02-27 DIAGNOSIS — I10 Essential (primary) hypertension: Secondary | ICD-10-CM | POA: Diagnosis not present

## 2018-02-27 DIAGNOSIS — E1121 Type 2 diabetes mellitus with diabetic nephropathy: Secondary | ICD-10-CM | POA: Diagnosis not present

## 2018-02-27 DIAGNOSIS — N182 Chronic kidney disease, stage 2 (mild): Secondary | ICD-10-CM | POA: Diagnosis not present

## 2018-03-04 DIAGNOSIS — E113212 Type 2 diabetes mellitus with mild nonproliferative diabetic retinopathy with macular edema, left eye: Secondary | ICD-10-CM | POA: Diagnosis not present

## 2018-03-05 DIAGNOSIS — M25562 Pain in left knee: Secondary | ICD-10-CM | POA: Diagnosis not present

## 2018-03-05 DIAGNOSIS — B029 Zoster without complications: Secondary | ICD-10-CM | POA: Diagnosis not present

## 2018-03-11 DIAGNOSIS — E113311 Type 2 diabetes mellitus with moderate nonproliferative diabetic retinopathy with macular edema, right eye: Secondary | ICD-10-CM | POA: Diagnosis not present

## 2018-03-14 ENCOUNTER — Encounter: Payer: Self-pay | Admitting: Family Medicine

## 2018-03-14 ENCOUNTER — Ambulatory Visit (INDEPENDENT_AMBULATORY_CARE_PROVIDER_SITE_OTHER): Payer: Medicare Other | Admitting: Family Medicine

## 2018-03-14 VITALS — BP 138/78 | HR 78 | Temp 98.1°F | Resp 16 | Ht 64.0 in | Wt 280.2 lb

## 2018-03-14 DIAGNOSIS — R195 Other fecal abnormalities: Secondary | ICD-10-CM

## 2018-03-14 DIAGNOSIS — E113513 Type 2 diabetes mellitus with proliferative diabetic retinopathy with macular edema, bilateral: Secondary | ICD-10-CM | POA: Diagnosis not present

## 2018-03-14 DIAGNOSIS — N182 Chronic kidney disease, stage 2 (mild): Secondary | ICD-10-CM | POA: Diagnosis not present

## 2018-03-14 DIAGNOSIS — E782 Mixed hyperlipidemia: Secondary | ICD-10-CM | POA: Diagnosis not present

## 2018-03-14 DIAGNOSIS — I10 Essential (primary) hypertension: Secondary | ICD-10-CM | POA: Diagnosis not present

## 2018-03-14 DIAGNOSIS — Z6841 Body Mass Index (BMI) 40.0 and over, adult: Secondary | ICD-10-CM

## 2018-03-14 DIAGNOSIS — R808 Other proteinuria: Secondary | ICD-10-CM

## 2018-03-14 DIAGNOSIS — M5442 Lumbago with sciatica, left side: Secondary | ICD-10-CM

## 2018-03-14 DIAGNOSIS — G8929 Other chronic pain: Secondary | ICD-10-CM | POA: Diagnosis not present

## 2018-03-14 NOTE — Assessment & Plan Note (Signed)
No visible blood; had colonoscopy

## 2018-03-14 NOTE — Assessment & Plan Note (Signed)
Stable, seeing kdiney specialist; avoiding NSAIDs; good water drinker

## 2018-03-14 NOTE — Assessment & Plan Note (Signed)
Check A1c today; eye exam UTD; foot exam by MD today

## 2018-03-14 NOTE — Assessment & Plan Note (Signed)
Keep trying to eat better; goal HDL >50 discussed; she'll try to lose weight; continue statin

## 2018-03-14 NOTE — Assessment & Plan Note (Signed)
Seeing nephrologist 

## 2018-03-14 NOTE — Assessment & Plan Note (Signed)
Patient is not really making any headway; vows to do better; does not want referral to nutritionist when offered; did not want medicine for diabetes that could help with weight loss

## 2018-03-14 NOTE — Patient Instructions (Addendum)
Try to limit saturated fats in your diet (bologna, hot dogs, barbeque, cheeseburgers, hamburgers, steak, bacon, sausage, cheese, etc.) and get more fresh fruits, vegetables, and whole grains  Try to follow the DASH guidelines (DASH stands for Dietary Approaches to Stop Hypertension). Try to limit the sodium in your diet to no more than 1,500mg of sodium per day. Certainly try to not exceed 2,000 mg per day at the very most. Do not add salt when cooking or at the table.  Check the sodium amount on labels when shopping, and choose items lower in sodium when given a choice. Avoid or limit foods that already contain a lot of sodium. Eat a diet rich in fruits and vegetables and whole grains, and try to lose weight if overweight or obese  Check out the information at familydoctor.org entitled "Nutrition for Weight Loss: What You Need to Know about Fad Diets" Try to lose between 1-2 pounds per week by taking in fewer calories and burning off more calories You can succeed by limiting portions, limiting foods dense in calories and fat, becoming more active, and drinking 8 glasses of water a day (64 ounces) Don't skip meals, especially breakfast, as skipping meals may alter your metabolism Do not use over-the-counter weight loss pills or gimmicks that claim rapid weight loss A healthy BMI (or body mass index) is between 18.5 and 24.9 You can calculate your ideal BMI at the NIH website http://www.nhlbi.nih.gov/health/educational/lose_wt/BMI/bmicalc.htm  

## 2018-03-14 NOTE — Assessment & Plan Note (Signed)
Likely sciatica; patient may call for low dose flexeril in the future if needed; gentle stretching

## 2018-03-14 NOTE — Progress Notes (Signed)
BP 138/78 (BP Location: Right Arm, Patient Position: Sitting, Cuff Size: Large)   Pulse 78   Temp 98.1 F (36.7 C) (Oral)   Resp 16   Ht 5\' 4"  (1.626 m)   Wt 280 lb 3.2 oz (127.1 kg)   SpO2 96%   BMI 48.10 kg/m    Subjective:    Patient ID: Julie [name], female    DOB: September 18, 1946, 72 y.o.   MRN: [id number]  HPI: APRYL [name] is a 72 y.o. female  Chief Complaint  Patient presents with  . Follow-up    3 month recheck    HPI She is here for follow-up Type 2 diabetes; she feels like it is doing better; she hasn't done as good as she wants to; looking forward to that A1c; ; seeing eye doctor regularly for injections; on two pills; we talked about injectable but she is reluctant unless absolutely needed; she thinks if she can really work, she'll get it down; last microalb:Cr was >1400; she saw the kidney doctor and goes back in June again; limiting red meat; CKD, avoiding NSAIDs, good water drinker  High cholesterol Lab Results  Component Value Date   CHOL 150 12/09/2017   HDL 40 12/09/2017   [place] 89 12/09/2017   TRIG 105 12/09/2017   CHOLHDL 3.8 12/09/2017   Morbid obesity; took prednisone last week for her knee; thinking some fluid  She had knee problems and went to urgent care recently; left knee; getting better; they have her something; meniscus inflamed; will go back to Dr. Marry Guan; she had some sciatica a month ago; she would like a muscle relaxant in the future if needed; has not used in a while; does not make her loopy or drunk when she takes it  She also had shingles too and went to urgent care; they gave her something; left flank; doing better now; rubbing them makes them feel tender   Hx of stool guaiac positivity; she remembers that vaguely, had colonoscopy and all was well; no visible blood  Depression screen [facility] 2/9 03/14/2018 12/06/2017 04/23/2017 01/04/2017 06/06/2016  Decreased Interest 0 0 0 0 0  Down, Depressed, Hopeless 0 0 0 1 1  PHQ - 2 Score 0  0 0 1 1  Altered sleeping - - - - 3  Tired, decreased energy - - - - 0  Change in appetite - - - - 2  Feeling bad or failure about yourself  - - - - 0  Trouble concentrating - - - - 0  Moving slowly or fidgety/restless - - - - 0  Suicidal thoughts - - - - 0  PHQ-9 Score - - - - 6  Difficult doing work/chores - - - - Not difficult at all    Relevant past medical, surgical, family and social history reviewed Past Medical History:  Diagnosis Date  . Arthritis   . Cataract   . CKD (chronic kidney disease) stage 2, GFR 60-89 ml/min   . Diabetes mellitus without complication ([place])   . Hyperlipidemia   . Hypertension   . Morbid obesity ([place])   . Osteoporosis March 2015  . Proteinuria Jan 2015   referred to Nephrology   Past Surgical History:  Procedure Laterality Date  . COLONOSCOPY WITH PROPOFOL N/A 07/16/2017   Procedure: COLONOSCOPY WITH PROPOFOL;  Surgeon: Robert Bellow, MD;  Location: [facility] ENDOSCOPY;  Service: Endoscopy;  Laterality: N/A;  . DILATION AND CURETTAGE OF UTERUS  2007  . ESOPHAGOGASTRODUODENOSCOPY (EGD) WITH PROPOFOL  N/A 07/16/2017   Procedure: ESOPHAGOGASTRODUODENOSCOPY (EGD) WITH PROPOFOL;  Surgeon: Robert Bellow, MD;  Location: [facility] ENDOSCOPY;  Service: Endoscopy;  Laterality: N/A;  . MOUTH SURGERY  2007   cyst  . TONSILLECTOMY     Family History  Problem Relation Age of Onset  . Glaucoma Mother   . Cancer Father        liver  . Cancer Sister        lung, bone  . COPD Sister   . Heart disease Maternal Grandmother        chf  . Hypertension Brother   . Hypertension Maternal Uncle   . Ovarian cancer Paternal Grandmother   . Cancer Paternal Grandmother        ovarian cancer  . Breast cancer Neg Hx    Social History   Tobacco Use  . Smoking status: Never Smoker  . Smokeless tobacco: Never Used  Substance Use Topics  . Alcohol use: No  . Drug use: No    Interim medical history since last visit reviewed. Allergies and medications  reviewed  Review of Systems Per HPI unless specifically indicated above     Objective:    BP 138/78 (BP Location: Right Arm, Patient Position: Sitting, Cuff Size: Large)   Pulse 78   Temp 98.1 F (36.7 C) (Oral)   Resp 16   Ht 5\' 4"  (1.626 m)   Wt 280 lb 3.2 oz (127.1 kg)   SpO2 96%   BMI 48.10 kg/m   Wt Readings from Last 3 Encounters:  03/14/18 280 lb 3.2 oz (127.1 kg)  12/06/17 282 lb 6.4 oz (128.1 kg)  07/16/17 270 lb (122.5 kg)    Physical Exam  Constitutional: She appears well-developed and well-nourished. No distress.  Morbidly obese  HENT:  Head: Normocephalic and atraumatic.  Eyes: EOM are normal. No scleral icterus.  Neck: No thyromegaly present.  Cardiovascular: Normal rate, regular rhythm and normal heart sounds.  No murmur heard. Pulmonary/Chest: Effort normal and breath sounds normal. No respiratory distress. She has no wheezes.  Abdominal: Soft. Bowel sounds are normal. She exhibits no distension.  Musculoskeletal: She exhibits no edema.  Neurological: She is alert. She exhibits normal muscle tone.  Skin: Skin is warm and dry. She is not diaphoretic. No pallor.  Psychiatric: She has a normal mood and affect. Her behavior is normal. Judgment and thought content normal.   Diabetic Foot Form - Detailed   Diabetic Foot Exam - detailed Diabetic Foot exam was performed with the following findings:  Yes 03/14/2018 10:24 AM  Visual Foot Exam completed.:  Yes  Pulse Foot Exam completed.:  Yes  Right Dorsalis Pedis:  Present Left Dorsalis Pedis:  Present  Sensory Foot Exam Completed.:  Yes Semmes-Weinstein Monofilament Test R Site 1-Great Toe:  Pos L Site 1-Great Toe:  Pos        Results for orders placed or performed in visit on 12/06/17  Lipid panel  Result Value Ref Range   Cholesterol, Total 150 100 - 199 mg/dL   Triglycerides 105 0 - 149 mg/dL   HDL 40 >39 mg/dL   VLDL Cholesterol Cal 21 5 - 40 mg/dL   LDL Calculated 89 0 - 99 mg/dL   Chol/HDL  Ratio 3.8 0.0 - 4.4 ratio  Hemoglobin A1c  Result Value Ref Range   Hgb A1c MFr Bld 9.0 (H) 4.8 - 5.6 %   Est. average glucose Bld gHb Est-mCnc 212 mg/dL  Microalbumin / creatinine urine ratio  Result  Value Ref Range   Creatinine, Urine 150.4 Not Estab. mg/dL   Microalbumin, Urine 2,122.5 Not Estab. ug/mL   Microalb/Creat Ratio 1,411.2 (H) 0.0 - 30.0 mg/g creat  Comprehensive metabolic panel  Result Value Ref Range   Glucose 193 (H) 65 - 99 mg/dL   BUN 16 8 - 27 mg/dL   Creatinine, Ser 0.77 0.57 - 1.00 mg/dL   GFR calc non Af Amer 79 >59 mL/min/1.73   GFR calc Af Amer 91 >59 mL/min/1.73   BUN/Creatinine Ratio 21 12 - 28   Sodium 143 134 - 144 mmol/L   Potassium 4.3 3.5 - 5.2 mmol/L   Chloride 103 96 - 106 mmol/L   CO2 25 20 - 29 mmol/L   Calcium 9.3 8.7 - 10.3 mg/dL   Total Protein 6.1 6.0 - 8.5 g/dL   Albumin 3.7 3.6 - 4.8 g/dL   Globulin, Total 2.4 1.5 - 4.5 g/dL   Albumin/Globulin Ratio 1.5 1.2 - 2.2   Bilirubin Total 0.3 0.0 - 1.2 mg/dL   Alkaline Phosphatase 83 39 - 117 IU/L   AST 13 0 - 40 IU/L   ALT 15 0 - 32 IU/L      Assessment & Plan:   Problem List Items Addressed This Visit      Cardiovascular and Mediastinum   Hypertension (Chronic)    Pretty close to goal; try to work on DASH guidelines; she does not necessarily want to add medicine        Endocrine   Diabetes mellitus with proliferative retinopathy ([facility]) (Chronic)    Check A1c today; eye exam UTD; foot exam by MD today      Relevant Orders   Hemoglobin A1c     Genitourinary   CKD (chronic kidney disease) stage 2, GFR 60-89 ml/min - Primary    Stable, seeing kdiney specialist; avoiding NSAIDs; good water drinker        Other   Stool guaiac positive    No visible blood; had colonoscopy      Proteinuria    Seeing nephrologist      Morbid obesity ([place]) (Chronic)    Patient is not really making any headway; vows to do better; does not want referral to nutritionist when offered; did not want  medicine for diabetes that could help with weight loss      Low back pain    Likely sciatica; patient may call for low dose flexeril in the future if needed; gentle stretching      Hyperlipidemia (Chronic)    Keep trying to eat better; goal HDL >50 discussed; she'll try to lose weight; continue statin       Other Visit Diagnoses    BMI 45.0-49.9, adult ([place])       BMI 48; encouraged weight loss; patient does not sound motivated to change       Follow up plan: Return in about 3 months (around 06/14/2018) for follow-up visit with Dr. Sanda Klein.  An after-visit summary was printed and given to the patient at [place].  Please see the patient instructions which may contain other information and recommendations beyond what is mentioned above in the assessment and plan.  No orders of the defined types were placed in this encounter.   Orders Placed This Encounter  Procedures  . Hemoglobin A1c

## 2018-03-14 NOTE — Assessment & Plan Note (Signed)
Pretty close to goal; try to work on [place] guidelines; she does not necessarily want to add medicine

## 2018-03-15 LAB — HEMOGLOBIN A1C
EAG (MMOL/L): 12.8 (calc)
Hgb A1c MFr Bld: 9.7 % of total Hgb — ABNORMAL HIGH (ref <5.7)
MEAN PLASMA GLUCOSE: 232 (calc)

## 2018-03-19 ENCOUNTER — Telehealth: Payer: Self-pay | Admitting: Family Medicine

## 2018-03-19 NOTE — Telephone Encounter (Signed)
Copied from [place] [id number]. Topic: Quick Communication - Rx Refill/Question >> Mar 19, 2018 11:32 AM Keene Breath wrote: Medication: muscle relaxer.  Patient discussed getting a muscle relaxer at her prior appointment on 03/14/18.  Doctor said if she needed the relaxer to call back to the office.   Preferred [facility] [address] ([address]), [place] [address] [place] ([place])  [id number]

## 2018-03-20 MED ORDER — CYCLOBENZAPRINE HCL 5 MG PO TABS: 5.0000 mg | ORAL_TABLET | Freq: Three times a day (TID) | ORAL | 0 refills | Status: DC | PRN

## 2018-03-20 NOTE — Telephone Encounter (Signed)
Patient has reconsidered and would like to have Rx for muscle relaxer.

## 2018-03-20 NOTE — Telephone Encounter (Signed)
Pt.notified

## 2018-03-20 NOTE — Telephone Encounter (Signed)
Rx sent 

## 2018-03-21 ENCOUNTER — Encounter: Payer: Self-pay | Admitting: Family Medicine

## 2018-03-21 DIAGNOSIS — E113553 Type 2 diabetes mellitus with stable proliferative diabetic retinopathy, bilateral: Secondary | ICD-10-CM

## 2018-03-26 NOTE — Addendum Note (Signed)
Addended by: Nael Petrosyan, Satira Anis on: 03/26/2018 03:45 PM   Modules accepted: Orders

## 2018-04-02 DIAGNOSIS — E113212 Type 2 diabetes mellitus with mild nonproliferative diabetic retinopathy with macular edema, left eye: Secondary | ICD-10-CM | POA: Diagnosis not present

## 2018-04-10 DIAGNOSIS — R809 Proteinuria, unspecified: Secondary | ICD-10-CM | POA: Diagnosis not present

## 2018-04-10 DIAGNOSIS — I10 Essential (primary) hypertension: Secondary | ICD-10-CM | POA: Diagnosis not present

## 2018-04-10 DIAGNOSIS — N182 Chronic kidney disease, stage 2 (mild): Secondary | ICD-10-CM | POA: Diagnosis not present

## 2018-04-10 DIAGNOSIS — E1122 Type 2 diabetes mellitus with diabetic chronic kidney disease: Secondary | ICD-10-CM | POA: Diagnosis not present

## 2018-04-17 ENCOUNTER — Other Ambulatory Visit: Payer: Self-pay | Admitting: Family Medicine

## 2018-04-18 NOTE — Telephone Encounter (Signed)
Last K+, Cr, sgpt reviewed

## 2018-04-21 DIAGNOSIS — E113311 Type 2 diabetes mellitus with moderate nonproliferative diabetic retinopathy with macular edema, right eye: Secondary | ICD-10-CM | POA: Diagnosis not present

## 2018-05-01 DIAGNOSIS — H353212 Exudative age-related macular degeneration, right eye, with inactive choroidal neovascularization: Secondary | ICD-10-CM | POA: Diagnosis not present

## 2018-05-01 DIAGNOSIS — E113212 Type 2 diabetes mellitus with mild nonproliferative diabetic retinopathy with macular edema, left eye: Secondary | ICD-10-CM | POA: Diagnosis not present

## 2018-05-29 DIAGNOSIS — K098 Other cysts of oral region, not elsewhere classified: Secondary | ICD-10-CM | POA: Diagnosis not present

## 2018-06-02 DIAGNOSIS — E113311 Type 2 diabetes mellitus with moderate nonproliferative diabetic retinopathy with macular edema, right eye: Secondary | ICD-10-CM | POA: Diagnosis not present

## 2018-06-04 DIAGNOSIS — E113212 Type 2 diabetes mellitus with mild nonproliferative diabetic retinopathy with macular edema, left eye: Secondary | ICD-10-CM | POA: Diagnosis not present

## 2018-06-09 ENCOUNTER — Other Ambulatory Visit: Payer: Self-pay | Admitting: Family Medicine

## 2018-06-20 ENCOUNTER — Ambulatory Visit (INDEPENDENT_AMBULATORY_CARE_PROVIDER_SITE_OTHER): Payer: Medicare Other

## 2018-06-20 VITALS — BP 144/60 | HR 88 | Temp 98.0°F | Resp 12 | Ht 64.0 in | Wt 282.4 lb

## 2018-06-20 DIAGNOSIS — Z598 Other problems related to housing and economic circumstances: Secondary | ICD-10-CM

## 2018-06-20 DIAGNOSIS — Z599 Problem related to housing and economic circumstances, unspecified: Secondary | ICD-10-CM

## 2018-06-20 DIAGNOSIS — Z23 Encounter for immunization: Secondary | ICD-10-CM

## 2018-06-20 DIAGNOSIS — Z0000 Encounter for general adult medical examination without abnormal findings: Secondary | ICD-10-CM

## 2018-06-20 NOTE — Progress Notes (Addendum)
Subjective:   Julie [name] is a 72 y.o. female who presents for Medicare Annual (Subsequent) preventive examination.  Review of Systems:  N/A Cardiac Risk Factors include: advanced age (>61men, >84 women);diabetes mellitus;dyslipidemia;hypertension;obesity (BMI >30kg/m2);sedentary lifestyle     Objective:     Vitals: BP (!) 144/60 (BP Location: Left Arm, Patient Position: Sitting, Cuff Size: Large)   Pulse 88   Temp 98 F (36.7 C) (Oral)   Resp 12   Ht 5\' 4"  (1.626 m)   Wt 282 lb 6.4 oz (128.1 kg)   SpO2 93%   BMI 48.47 kg/m   Body mass index is 48.47 kg/m.  Advanced Directives 06/20/2018 07/16/2017 04/23/2017 01/04/2017 06/06/2016 03/06/2016  Does Patient Have a Medical Advance Directive? Yes No Yes Yes Yes Yes  Type of Paramedic of [place];Living will Out of facility DNR (pink MOST or yellow form) - - Living will Living will  Does patient want to make changes to medical advance directive? - - - - No - Patient declined -  Copy of [place] in Chart? No - copy requested - - - - No - copy requested    Tobacco Social History   Tobacco Use  Smoking Status Never Smoker  Smokeless Tobacco Never Used  Tobacco Comment   smoking cessation materials not required     Counseling given: No Comment: smoking cessation materials not required  Clinical Intake:  Pre-visit preparation completed: Yes  Pain : No/denies pain   BMI - recorded: 48.47 Nutritional Status: BMI > 30  Obese Nutritional Risks: None  Nutrition Risk Assessment: Has the patient had any N/V/D within the last 2 months?  No Does the patient have any non-healing wounds?  No Has the patient had any unintentional weight loss or weight gain?  No  Is the patient diabetic?  Yes If diabetic, was a CBG obtained today?  No Did the patient bring in their glucometer from home?  No Comments: Pt monitors CBG's daily. Denies any financial strains with the device or  supplies.  Diabetic Exams: Diabetic Eye Exam: Completed 07/09/17. Pt states she is scheduled to see Dr. George Ina but was unable to recall date. Diabetic Foot Exam: Completed 03/14/18.   How often do you need to have someone help you when you read instructions, pamphlets, or other written materials from your doctor or pharmacy?: 1 - Never  Interpreter Needed?: No  Information entered by :: Idell Pickles, LPN  Past Medical History:  Diagnosis Date  . Arthritis   . Cataract   . CKD (chronic kidney disease) stage 2, GFR 60-89 ml/min   . Diabetes mellitus without complication ([place])   . Hyperlipidemia   . Hypertension   . Morbid obesity ([place])   . Osteoporosis March 2015  . Proteinuria Jan 2015   referred to Nephrology   Past Surgical History:  Procedure Laterality Date  . COLONOSCOPY WITH PROPOFOL N/A 07/16/2017   Procedure: COLONOSCOPY WITH PROPOFOL;  Surgeon: Robert Bellow, MD;  Location: [facility] ENDOSCOPY;  Service: Endoscopy;  Laterality: N/A;  . DILATION AND CURETTAGE OF UTERUS  2007  . ESOPHAGOGASTRODUODENOSCOPY (EGD) WITH PROPOFOL N/A 07/16/2017   Procedure: ESOPHAGOGASTRODUODENOSCOPY (EGD) WITH PROPOFOL;  Surgeon: Robert Bellow, MD;  Location: [facility];  Service: Endoscopy;  Laterality: N/A;  . MOUTH SURGERY  2007   cyst  . TONSILLECTOMY     Family History  Problem Relation Age of Onset  . Glaucoma Mother   . Cancer Father  liver  . Cancer Sister        lung, bone  . COPD Sister   . Heart disease Maternal Grandmother        chf  . Hypertension Brother   . Hypertension Maternal Uncle   . Ovarian cancer Paternal Grandmother   . Cancer Paternal Grandmother        ovarian cancer  . Breast cancer Neg Hx    Social History   Socioeconomic History  . Marital status: Married    Spouse name: Rosita Fire  . Number of children: 1  . Years of education: some college  . Highest education level: 12th grade  Occupational History  . Occupation: Retired  Photographer  . Financial resource strain: Very hard  . Food insecurity:    Worry: Never true    Inability: Never true  . Transportation needs:    Medical: No    Non-medical: No  Tobacco Use  . Smoking status: Never Smoker  . Smokeless tobacco: Never Used  . Tobacco comment: smoking cessation materials not required  Substance and Sexual Activity  . Alcohol use: No  . Drug use: No  . Sexual activity: Not Currently  Lifestyle  . Physical activity:    Days per week: 0 days    Minutes per session: 0 min  . Stress: Not at all  Relationships  . Social connections:    Talks on phone: Patient refused    Gets together: Patient refused    Attends religious service: Patient refused    Active member of club or organization: Patient refused    Attends meetings of clubs or organizations: Patient refused    Relationship status: Married  Other Topics Concern  . Not on file  Social History Narrative  . Not on file    Outpatient Encounter Medications as of 06/20/2018  Medication Sig  . amLODipine (NORVASC) 10 MG tablet TAKE 1 TABLET BY MOUTH  DAILY  . aspirin 81 MG tablet Take 81 mg by mouth daily.  Marland Kitchen atorvastatin (LIPITOR) 10 MG tablet TAKE 1 TABLET BY MOUTH AT  BEDTIME  . bevacizumab (AVASTIN) 1.25 mg/0.1 mL SOLN 10 mg by Intravitreal route to Surgery.  . Cholecalciferol (VITAMIN D-1000 MAX ST) 1000 UNITS tablet Take 1,000 Units by mouth daily.  . cyclobenzaprine (FLEXERIL) 5 MG tablet Take 1 tablet (5 mg total) by mouth every 8 (eight) hours as needed for muscle spasms.  Marland Kitchen glipiZIDE (GLUCOTROL XL) 10 MG 24 hr tablet TAKE 1 TABLET BY MOUTH  TWICE A DAY  . hydrochlorothiazide (HYDRODIURIL) 25 MG tablet TAKE 1 TABLET BY MOUTH  DAILY  . lisinopril (PRINIVIL,ZESTRIL) 40 MG tablet TAKE 1 TABLET BY MOUTH  DAILY  . metFORMIN (GLUCOPHAGE) 500 MG tablet TAKE 2 TABLETS BY MOUTH TWO TIMES DAILY  . Multiple Vitamin (MULTI-VITAMINS) TABS Take by mouth daily.   . Omega-3 Fatty Acids (FISH OIL) 1000 MG CAPS  Take 1,000 mg by mouth 2 (two) times daily.  Marland Kitchen alendronate (FOSAMAX) 70 MG tablet Take 70 mg by mouth once a week.    Facility-Administered Encounter Medications as of 06/20/2018  Medication  . triamcinolone acetonide (KENALOG) 10 MG/ML injection 10 mg    Activities of Daily Living In your present state of health, do you have any difficulty performing the following activities: 06/20/2018 12/06/2017  Hearing? N N  Comment denies hearing aids -  Vision? N N  Comment wears eyeglasses -  Difficulty concentrating or making decisions? N N  Walking or climbing stairs?  Y N  Comment back pain, avoids stairs -  Dressing or bathing? N N  Doing errands, shopping? N N  Preparing Food and eating ? N -  Comment denies dentures -  Using the Toilet? N -  In the past six months, have you accidently leaked urine? N -  Do you have problems with loss of bowel control? N -  Managing your Medications? N -  Managing your Finances? N -  Housekeeping or managing your Housekeeping? N -  Some recent data might be hidden    Patient Care Team: Lada, Satira Anis, MD as PCP - General (Family Medicine) Lonia Farber, MD as Consulting Physician (Internal Medicine) Gillis Santa, MD as Consulting Physician (Pain Medicine)    Assessment:   This is a routine wellness examination for [facility].  Exercise Activities and Dietary recommendations Current Exercise Habits: The patient does not participate in regular exercise at present, Exercise limited by: None identified  Goals    . Exercise 150 min/wk Moderate Activity     Recommend to exercise for at least 150 minutes per week.        Fall Risk Fall Risk  06/20/2018 03/14/2018 12/06/2017 04/23/2017 01/04/2017  Falls in the past year? Yes No No Yes Yes  Number falls in past yr: 1 - - 1 1  Injury with Fall? Yes - - No No  Risk Factor Category  High Fall Risk - - - -  Risk for fall due to : Impaired vision;Impaired balance/gait - - - -  Risk for fall due to:  Comment wears eyeglasses; ambulates with cane; knee pain - - - -  Follow up Falls evaluation completed;Education provided;Falls prevention discussed - - - -   FALL RISK PREVENTION PERTAINING TO HOME: Is your home free of loose throw rugs in walkways, pet beds, electrical cords, etc? Yes Is there adequate lighting in your home to reduce risk of falls?  Yes Are there stairs in or around your home WITH handrails? Yes  ASSISTIVE DEVICES UTILIZED TO PREVENT FALLS: Use of a cane, walker or w/c? Yes, cane Grab bars in the bathroom? Yes  Shower chair or a place to sit while bathing? No An elevated toilet seat or a handicapped toilet? Yes  Timed Get Up and Go Performed: Yes. Pt ambulated 10 feet within 31 sec. Gait slow, steady and with the use of an assistive device. No intervention required at this time. Fall risk prevention has been discussed.  Community Resource Referral:  Pt declined my offer to send [employer] Referral to Care Guide for a shower chair.  Depression Screen PHQ 2/9 Scores 06/20/2018 03/14/2018 12/06/2017 04/23/2017  PHQ - 2 Score 2 0 0 0  PHQ- 9 Score 9 - - -     Cognitive Function     6CIT Screen 06/20/2018  What Year? 0 points  What month? 0 points  What time? 0 points  Count back from 20 0 points  Months in reverse 0 points  Repeat phrase 0 points  Total Score 0    Immunization History  Administered Date(s) Administered  . Influenza Inj Mdck Quad With Preservative 07/01/2017  . Influenza, High Dose Seasonal PF 06/20/2018  . Influenza, Seasonal, Injecte, Preservative Fre 07/24/2011, 07/01/2012  . Influenza,inj,Quad PF,6+ Mos 07/28/2015  . Influenza-Unspecified 07/13/2014, 07/07/2016, 07/11/2017  . Pneumococcal Conjugate-13 07/13/2014  . Pneumococcal Polysaccharide-23 02/27/2010, 06/03/2013  . Zoster 12/09/2013    Qualifies for Shingles Vaccine? Yes. Zostavax completed 12/09/13. Due for Shingrix. Education has been  provided regarding the importance of  this vaccine. Pt has been advised to call insurance company to determine out of pocket expense. Advised may also receive vaccine at local pharmacy or Health Dept. Verbalized acceptance and understanding.  Screening Tests Health Maintenance  Topic Date Due  . Hepatitis C Screening  12/06/2018 (Originally September 19, 1946)  . OPHTHALMOLOGY EXAM  07/09/2018  . HEMOGLOBIN A1C  09/13/2018  . MAMMOGRAM  02/13/2019  . FOOT EXAM  03/15/2019  . TETANUS/TDAP  10/16/2019  . INFLUENZA VACCINE  Completed  . PNA vac Low Risk Adult  Completed  . DEXA SCAN  Addressed  . Fecal DNA (Cologuard)  Discontinued    Cancer Screenings: Lung: Low Dose CT Chest recommended if Age 35-80 years, 30 pack-year currently smoking OR have quit w/in 15years. Patient does not qualify. Breast:  Up to date on Mammogram? Yes. Completed 02/12/18. Repeat every year   Up to date of Bone Density/Dexa? Yes. Completed 09/24/16. Results reflect osteopenia and osteoporosis. Was taking alendronate but states she stopped the medication because she felt that it caused dental issues as well as an oral cyst. Declined my offer to order this screening for repeat in Dec 2019. States she has too much going on right now to focus on this screening. Colorectal: No longer required  Additional Screenings: Hepatitis C Screening: Declined    Plan:  I have personally reviewed and addressed the Medicare Annual Wellness questionnaire and have noted the following in the patient's chart:  A. Medical and social history B. Use of alcohol, tobacco or illicit drugs  C. Current medications and supplements D. Functional ability and status E.  Nutritional status F.  Physical activity G. Advance directives H. List of other physicians I.  Hospitalizations, surgeries, and ER visits in previous 12 months J.  [place] such as hearing and vision if needed, cognitive and depression L. Referrals and appointments  In addition, I have reviewed and discussed  with patient certain preventive protocols, quality metrics, and best practice recommendations. A written personalized care plan for preventive services as well as general preventive health recommendations were provided to patient.  See attached scanned questionnaire for additional information.   Signed,  Aleatha Borer, LPN Nurse Health Advisor

## 2018-06-20 NOTE — Patient Instructions (Signed)
Julie [name] , Thank you for taking time to come for your Medicare Wellness Visit. I appreciate your ongoing commitment to your health goals. Please review the following plan we discussed and let me know if I can assist you in the future.   Screening recommendations/referrals: Colorectal Screening: No longer required Mammogram: Up to date Bone Density: Up to date  Vision and Dental Exams: Recommended annual ophthalmology exams for early detection of glaucoma and other disorders of the eye Recommended annual dental exams for proper oral hygiene  Diabetic Exams: Recommended annual diabetic eye exams for early detection of retinopathy Recommended annual diabetic foot exams for early detection of peripheral neuropathy.  Diabetic Eye Exam: Please keep your appointment with Dr. George Ina Diabetic Foot Exam: Up to date  Vaccinations: Influenza vaccine: Completed today Pneumococcal vaccine: Up to date Tdap vaccine: Up to date Shingles vaccine: Please call your insurance company to determine your out of pocket expense for the Shingrix vaccine. You may also receive this vaccine at your local pharmacy or Health Dept.    Advanced directives: Please bring a copy of your POA (Power of Attorney) and/or Living Will to your next appointment.  Goals: Recommend to exercise for at least 150 minutes per week.  Next appointment: Please schedule your Annual Wellness Visit with your Nurse Health Advisor in one year.  Preventive Care 70 Years and Older, Female Preventive care refers to lifestyle choices and visits with your health care provider that can promote health and wellness. What does preventive care include?  A yearly physical exam. This is also called an annual well check.  Dental exams once or twice a year.  Routine eye exams. Ask your health care provider how often you should have your eyes checked.  Personal lifestyle choices, including:  Daily care of your teeth and gums.  Regular  physical activity.  Eating a healthy diet.  Avoiding tobacco and drug use.  Limiting alcohol use.  Practicing safe sex.  Taking low-dose aspirin every day.  Taking vitamin and mineral supplements as recommended by your health care provider. What happens during an annual well check? The services and screenings done by your health care provider during your annual well check will depend on your age, overall health, lifestyle risk factors, and family history of disease. Counseling  Your health care provider may ask you questions about your:  Alcohol use.  Tobacco use.  Drug use.  Emotional well-being.  Home and relationship well-being.  Sexual activity.  Eating habits.  History of falls.  Memory and ability to understand (cognition).  Work and work Statistician.  Reproductive health. Screening  You may have the following tests or measurements:  Height, weight, and BMI.  Blood pressure.  Lipid and cholesterol levels. These may be checked every 5 years, or more frequently if you are over 70 years old.  Skin check.  Lung cancer screening. You may have this screening every year starting at age 70 if you have a 30-pack-year history of smoking and currently smoke or have quit within the past 15 years.  Fecal occult blood test (FOBT) of the stool. You may have this test every year starting at age 70.  Flexible sigmoidoscopy or colonoscopy. You may have a sigmoidoscopy every 5 years or a colonoscopy every 10 years starting at age 70.  Hepatitis C blood test.  Hepatitis B blood test.  Sexually transmitted disease (STD) testing.  Diabetes screening. This is done by checking your blood sugar (glucose) after you have not eaten for a  while (fasting). You may have this done every 1-3 years.  Bone density scan. This is done to screen for osteoporosis. You may have this done starting at age 30.  Mammogram. This may be done every 1-2 years. Talk to your health care  provider about how often you should have regular mammograms. Talk with your health care provider about your test results, treatment options, and if necessary, the need for more tests. Vaccines  Your health care provider may recommend certain vaccines, such as:  Influenza vaccine. This is recommended every year.  Tetanus, diphtheria, and acellular pertussis (Tdap, Td) vaccine. You may need a Td booster every 10 years.  Zoster vaccine. You may need this after age 62.  Pneumococcal 13-valent conjugate (PCV13) vaccine. One dose is recommended after age 70.  Pneumococcal polysaccharide (PPSV23) vaccine. One dose is recommended after age 4. Talk to your health care provider about which screenings and vaccines you need and how often you need them. This information is not intended to replace advice given to you by your health care provider. Make sure you discuss any questions you have with your health care provider. Document Released: 10/28/2015 Document Revised: 06/20/2016 Document Reviewed: 08/02/2015 Elsevier Interactive Patient Education  2017 [place] Prevention in the Home Falls can cause injuries. They can happen to people of all ages. There are many things you can do to make your home safe and to help prevent falls. What can I do on the outside of my home?  Regularly fix the edges of walkways and driveways and fix any cracks.  Remove anything that might make you trip as you walk through a door, such as a raised step or threshold.  Trim any bushes or trees on the path to your home.  Use bright outdoor lighting.  Clear any walking paths of anything that might make someone trip, such as rocks or tools.  Regularly check to see if handrails are loose or broken. Make sure that both sides of any steps have handrails.  Any raised decks and porches should have guardrails on the edges.  Have any leaves, snow, or ice cleared regularly.  Use sand or salt on walking paths  during winter.  Clean up any spills in your garage right away. This includes oil or grease spills. What can I do in the bathroom?  Use night lights.  Install grab bars by the toilet and in the tub and shower. Do not use towel bars as grab bars.  Use non-skid mats or decals in the tub or shower.  If you need to sit down in the shower, use a plastic, non-slip stool.  Keep the floor dry. Clean up any water that spills on the floor as soon as it happens.  Remove soap buildup in the tub or shower regularly.  Attach bath mats securely with double-sided non-slip rug tape.  Do not have throw rugs and other things on the floor that can make you trip. What can I do in the bedroom?  Use night lights.  Make sure that you have a light by your bed that is easy to reach.  Do not use any sheets or blankets that are too big for your bed. They should not hang down onto the floor.  Have a firm chair that has side arms. You can use this for support while you get dressed.  Do not have throw rugs and other things on the floor that can make you trip. What can I do in the  kitchen?  Clean up any spills right away.  Avoid walking on wet floors.  Keep items that you use a lot in easy-to-reach places.  If you need to reach something above you, use a strong step stool that has a grab bar.  Keep electrical cords out of the way.  Do not use floor polish or wax that makes floors slippery. If you must use wax, use non-skid floor wax.  Do not have throw rugs and other things on the floor that can make you trip. What can I do with my stairs?  Do not leave any items on the stairs.  Make sure that there are handrails on both sides of the stairs and use them. Fix handrails that are broken or loose. Make sure that handrails are as long as the stairways.  Check any carpeting to make sure that it is firmly attached to the stairs. Fix any carpet that is loose or worn.  Avoid having throw rugs at the top  or bottom of the stairs. If you do have throw rugs, attach them to the floor with carpet tape.  Make sure that you have a light switch at the top of the stairs and the bottom of the stairs. If you do not have them, ask someone to add them for you. What else can I do to help prevent falls?  Wear shoes that:  Do not have high heels.  Have rubber bottoms.  Are comfortable and fit you well.  Are closed at the toe. Do not wear sandals.  If you use a stepladder:  Make sure that it is fully opened. Do not climb a closed stepladder.  Make sure that both sides of the stepladder are locked into place.  Ask someone to hold it for you, if possible.  Clearly mark and make sure that you can see:  Any grab bars or handrails.  First and last steps.  Where the edge of each step is.  Use tools that help you move around (mobility aids) if they are needed. These include:  Canes.  Walkers.  Scooters.  Crutches.  Turn on the lights when you go into a dark area. Replace any light bulbs as soon as they burn out.  Set up your furniture so you have a clear path. Avoid moving your furniture around.  If any of your floors are uneven, fix them.  If there are any pets around you, be aware of where they are.  Review your medicines with your doctor. Some medicines can make you feel dizzy. This can increase your chance of falling. Ask your doctor what other things that you can do to help prevent falls. This information is not intended to replace advice given to you by your health care provider. Make sure you discuss any questions you have with your health care provider. Document Released: 07/28/2009 Document Revised: 03/08/2016 Document Reviewed: 11/05/2014 Elsevier Interactive Patient Education  2017 [employer].

## 2018-06-24 ENCOUNTER — Ambulatory Visit (INDEPENDENT_AMBULATORY_CARE_PROVIDER_SITE_OTHER): Payer: Medicare Other | Admitting: Family Medicine

## 2018-06-24 ENCOUNTER — Encounter: Payer: Self-pay | Admitting: Family Medicine

## 2018-06-24 VITALS — BP 136/78 | HR 99 | Temp 98.0°F | Ht 64.0 in | Wt 282.1 lb

## 2018-06-24 DIAGNOSIS — Z5181 Encounter for therapeutic drug level monitoring: Secondary | ICD-10-CM

## 2018-06-24 DIAGNOSIS — I10 Essential (primary) hypertension: Secondary | ICD-10-CM

## 2018-06-24 DIAGNOSIS — E782 Mixed hyperlipidemia: Secondary | ICD-10-CM | POA: Diagnosis not present

## 2018-06-24 DIAGNOSIS — R808 Other proteinuria: Secondary | ICD-10-CM

## 2018-06-24 DIAGNOSIS — M810 Age-related osteoporosis without current pathological fracture: Secondary | ICD-10-CM | POA: Diagnosis not present

## 2018-06-24 DIAGNOSIS — M79621 Pain in right upper arm: Secondary | ICD-10-CM

## 2018-06-24 DIAGNOSIS — N182 Chronic kidney disease, stage 2 (mild): Secondary | ICD-10-CM | POA: Diagnosis not present

## 2018-06-24 DIAGNOSIS — E113513 Type 2 diabetes mellitus with proliferative diabetic retinopathy with macular edema, bilateral: Secondary | ICD-10-CM

## 2018-06-24 NOTE — Patient Instructions (Signed)
Check out the information at familydoctor.org entitled "Nutrition for Weight Loss: What You Need to Know about Fad Diets" Try to lose between 1-2 pounds per week by taking in fewer calories and burning off more calories You can succeed by limiting portions, limiting foods dense in calories and fat, becoming more active, and drinking 8 glasses of water a day (64 ounces) Don't skip meals, especially breakfast, as skipping meals may alter your metabolism Do not use over-the-counter weight loss pills or gimmicks that claim rapid weight loss A healthy BMI (or body mass index) is between 18.5 and 24.9 You can calculate your ideal BMI at the NIH website http://www.nhlbi.nih.gov/health/educational/lose_wt/BMI/bmicalc.htm  

## 2018-06-24 NOTE — Assessment & Plan Note (Signed)
Almost to goal; work on weight loss and diet and DASH guidelines

## 2018-06-24 NOTE — Assessment & Plan Note (Signed)
Managed by kidney doctor; avoiding NSAIDs

## 2018-06-24 NOTE — Assessment & Plan Note (Signed)
Check A1c; foot exam by MD today 

## 2018-06-24 NOTE — Assessment & Plan Note (Signed)
She might consider bariatric surgery; offered referral; she declined

## 2018-06-24 NOTE — Assessment & Plan Note (Signed)
Limit saturated fats; limit eggs and cheese; will check lipids

## 2018-06-24 NOTE — Progress Notes (Signed)
BP 136/78   Pulse 99   Temp 98 F (36.7 C) (Oral)   Ht 5\' 4"  (1.626 m)   Wt 282 lb 1.6 oz (128 kg)   SpO2 93%   BMI 48.42 kg/m    Subjective:    Patient ID: Julie [name], female    DOB: 01-May-1946, 72 y.o.   MRN: [id number]  HPI: Julie [name] is a 72 y.o. female  Chief Complaint  Patient presents with  . Follow-up    HPI Hypertension: rx amlodipine 10mg , HCTZ 25mg , lisinopril 40mg  daily; rarely missed doses. Missed HCTZ yesterday. Had held it because she was going out and didn't want to urinate a lot.  Denies blurry vision, chest pain, headaches  BP Readings from Last 3 Encounters:  06/24/18 136/78  06/20/18 (!) 144/60  03/14/18 138/78    Diabetes 2: takes metformin 2000mg  daily and glipizide 10mg  daily. ACEi for nephroprotection.  Has an appointment with endocrinology mid October. Since then has included some more activity and less salt and more whole wheat.  CKD with positive urine microalb:Cr; nephrologist encouraged bariatric surgery Lab Results  Component Value Date   HGBA1C 9.7 (H) 03/14/2018    Osteoporosis: rx fosamax 70mg  daily- states stopped it in August 2019 because she has had some dental damage- had tumor above front upper palate that has caused shift in this. Dentist wants her off of fosamax for awhile before she can qualify for surgery. Has not had fracture since shoulder injury 6 years ago. Has fallen since then and hurt knee but not fractured. Uses cane.   CKD: rx ACEi; sees Dr. Holley Raring, nephrologist Lab Results  Component Value Date   CREATININE 0.77 12/09/2017   BUN 16 12/09/2017   NA 143 12/09/2017   K 4.3 12/09/2017   CL 103 12/09/2017   CO2 25 12/09/2017    Hyperlipidemia: rx lipitor 10mg  nightly; no issues with this medication.    Lab Results  Component Value Date   CHOL 150 12/09/2017   HDL 40 12/09/2017   LDLCALC 89 12/09/2017   TRIG 105 12/09/2017   CHOLHDL 3.8 12/09/2017    Morbid Obesity: nephrologist recommended  bariatric surgery Breakfast- egg white delight, tomato and wheat toast, occasionally gets bacon; does not like oatmeal Lunch- sometimes skips or gets a snack Trying to cook more at home, lower servings of meat, and some green leafy vegetable.  Likes to drink water or unsweetened tea; occasionally drinks coffee.   Eats veggies daily and fruits a few times a week.  Wt Readings from Last 3 Encounters:  06/24/18 282 lb 1.6 oz (128 kg)  06/20/18 282 lb 6.4 oz (128.1 kg)  03/14/18 280 lb 3.2 oz (127.1 kg)   Right underarm: soreness ongoing for about 6 months- had mammogram done that was negative, states is just maybe 1/10. Not much; tried extra shaving cream and new razors; she had this happen before and went away; no lumps palpable; no night sweats; occasional dry cough from her medicine, no phlegm  Depression screen [facility] 2/9 06/20/2018 03/14/2018 12/06/2017 04/23/2017 01/04/2017  Decreased Interest 1 0 0 0 0  Down, Depressed, Hopeless 1 0 0 0 1  PHQ - 2 Score 2 0 0 0 1  Altered sleeping 3 - - - -  Tired, decreased energy 0 - - - -  Change in appetite 3 - - - -  Feeling bad or failure about yourself  1 - - - -  Trouble concentrating 0 - - - -  Moving slowly or fidgety/restless 0 - - - -  Suicidal thoughts 0 - - - -  PHQ-9 Score 9 - - - -  Difficult doing work/chores Somewhat difficult - - - -    Relevant past medical, surgical, family and social history reviewed Past Medical History:  Diagnosis Date  . Arthritis   . Cataract   . CKD (chronic kidney disease) stage 2, GFR 60-89 ml/min   . Diabetes mellitus without complication ([place])   . Hyperlipidemia   . Hypertension   . Morbid obesity ([place])   . Osteoporosis March 2015  . Proteinuria Jan 2015   referred to Nephrology   Past Surgical History:  Procedure Laterality Date  . COLONOSCOPY WITH PROPOFOL N/A 07/16/2017   Procedure: COLONOSCOPY WITH PROPOFOL;  Surgeon: Robert Bellow, MD;  Location: [facility] ENDOSCOPY;  Service: Endoscopy;   Laterality: N/A;  . DILATION AND CURETTAGE OF UTERUS  2007  . ESOPHAGOGASTRODUODENOSCOPY (EGD) WITH PROPOFOL N/A 07/16/2017   Procedure: ESOPHAGOGASTRODUODENOSCOPY (EGD) WITH PROPOFOL;  Surgeon: Robert Bellow, MD;  Location: [place] ENDOSCOPY;  Service: Endoscopy;  Laterality: N/A;  . MOUTH SURGERY  2007   cyst  . TONSILLECTOMY     Family History  Problem Relation Age of Onset  . Glaucoma Mother   . Cancer Father        liver  . Cancer Sister        lung, bone  . COPD Sister   . Heart disease Maternal Grandmother        chf  . Hypertension Brother   . Hypertension Maternal Uncle   . Ovarian cancer Paternal Grandmother   . Cancer Paternal Grandmother        ovarian cancer  . Breast cancer Neg Hx    Social History   Tobacco Use  . Smoking status: Never Smoker  . Smokeless tobacco: Never Used  . Tobacco comment: smoking cessation materials not required  Substance Use Topics  . Alcohol use: No  . Drug use: No    Interim medical history since last visit reviewed. Allergies and medications reviewed  Review of Systems  Constitutional: Positive for activity change (increased activity; chair exercised). Negative for appetite change and fatigue.  Eyes: Positive for visual disturbance (sees optometry monthly, vision hasnt gotten worse). Negative for photophobia.  Respiratory: Positive for cough (dry occasional cough). Negative for shortness of breath.   Cardiovascular: Negative for chest pain and palpitations.  Gastrointestinal: Positive for constipation (improving now that she is off fosamax). Negative for abdominal pain, diarrhea and nausea.  Endocrine: Negative for polydipsia and polyphagia.  Genitourinary: Positive for frequency (taking HCTZ). Negative for dysuria and hematuria.  Musculoskeletal: Positive for arthralgias (bilateral knees) and myalgias. Negative for neck stiffness.  Skin: Negative for rash.  Neurological: Negative for dizziness, numbness and headaches.    Psychiatric/Behavioral: Negative for dysphoric mood (states learning to cope with husbands health problems) and sleep disturbance. The patient is not nervous/anxious.    Per HPI unless specifically indicated above     Objective:    BP 136/78   Pulse 99   Temp 98 F (36.7 C) (Oral)   Ht 5\' 4"  (1.626 m)   Wt 282 lb 1.6 oz (128 kg)   SpO2 93%   BMI 48.42 kg/m   Wt Readings from Last 3 Encounters:  06/24/18 282 lb 1.6 oz (128 kg)  06/20/18 282 lb 6.4 oz (128.1 kg)  03/14/18 280 lb 3.2 oz (127.1 kg)    Physical Exam  Constitutional: She  appears well-developed and well-nourished. No distress.  Morbidly obese  HENT:  Head: Normocephalic and atraumatic.  Eyes: EOM are normal. No scleral icterus.  Neck: No thyromegaly present.  Cardiovascular: Normal rate, regular rhythm and normal heart sounds.  No murmur heard. Pulmonary/Chest: Effort normal and breath sounds normal. No respiratory distress. She has no wheezes. She exhibits no mass. Right breast exhibits no inverted nipple, no mass, no nipple discharge, no skin change and no tenderness.  Site of tenderness is RIGHT axilla    Abdominal: Soft. Bowel sounds are normal. She exhibits no distension.  Musculoskeletal: She exhibits no edema.  Neurological: She is alert.  Skin: Skin is warm and dry. She is not diaphoretic. No pallor.  Psychiatric: She has a normal mood and affect. Her behavior is normal. Judgment and thought content normal. Her mood appears not anxious. She does not exhibit a depressed mood.   Diabetic Foot Form - Detailed   Diabetic Foot Exam - detailed Diabetic Foot exam was performed with the following findings:  Yes 06/24/2018 10:18 AM  Visual Foot Exam completed.:  Yes  Is there elevated skin temparature?:  No Normal Range of Motion:  Yes Pulse Foot Exam completed.:  Yes  Right posterior Tibialias:  Present Left posterior Tibialias:  Present  Right Dorsalis Pedis:  Present Left Dorsalis Pedis:  Present  Sensory  Foot Exam Completed.:  Yes Semmes-Weinstein Monofilament Test R Site 1-Great Toe:  Pos L Site 1-Great Toe:  Pos        Results for orders placed or performed in visit on 03/14/18  Hemoglobin A1c  Result Value Ref Range   Hgb A1c MFr Bld 9.7 (H) <5.7 % of total Hgb   Mean Plasma Glucose 232 (calc)   eAG (mmol/L) 12.8 (calc)      Diabetic Foot Form - Detailed   Diabetic Foot Exam - detailed Diabetic Foot exam was performed with the following findings:  Yes 06/24/2018 10:18 AM  Visual Foot Exam completed.:  Yes  Is there elevated skin temparature?:  No Normal Range of Motion:  Yes Pulse Foot Exam completed.:  Yes  Right posterior Tibialias:  Present Left posterior Tibialias:  Present  Right Dorsalis Pedis:  Present Left Dorsalis Pedis:  Present  Sensory Foot Exam Completed.:  Yes Semmes-Weinstein Monofilament Test R Site 1-Great Toe:  Pos L Site 1-Great Toe:  Pos        Assessment & Plan:   Problem List Items Addressed This Visit      Cardiovascular and Mediastinum   Hypertension - Primary (Chronic)    Almost to goal; work on weight loss and diet and DASH guidelines        Endocrine   Diabetes mellitus with proliferative retinopathy (HCC) (Chronic)    Check A1c; foot exam by MD today      Relevant Orders   HgB A1c   Lipid Profile     Musculoskeletal and Integument   Osteoporosis    Had to stop fosamax; going to see endocrinologist for DM and osteoporosis      Relevant Orders   VITAMIN D 25 Hydroxy (Vit-D Deficiency, Fractures)     Genitourinary   CKD (chronic kidney disease) stage 2, GFR 60-89 ml/min     Other   Proteinuria    Managed by kidney doctor; avoiding NSAIDs      Morbid obesity ([place]) (Chronic)    She might consider bariatric surgery; offered referral; she declined      Medication monitoring encounter  Check liver and kidneys      Relevant Orders   COMPLETE METABOLIC PANEL WITH GFR   Hyperlipidemia (Chronic)    Limit saturated fats;  limit eggs and cheese; will check lipids      Relevant Orders   Lipid Profile    Other Visit Diagnoses    Axillary pain, right       Relevant Orders   US BREAST LTD UNI RIGHT INC AXILLA       Follow up plan: Return in about 3 months (around 09/23/2018) for follow-up visit with Dr. Sanda Klein.  An after-visit summary was printed and given to the patient at [facility].  Please see the patient instructions which may contain other information and recommendations beyond what is mentioned above in the assessment and plan.  No orders of the defined types were placed in this encounter.   Orders Placed This Encounter  Procedures  . US BREAST LTD UNI RIGHT INC AXILLA  . HgB A1c  . COMPLETE METABOLIC PANEL WITH GFR  . Lipid Profile  . VITAMIN D 25 Hydroxy (Vit-D Deficiency, Fractures)

## 2018-06-24 NOTE — Assessment & Plan Note (Signed)
Had to stop fosamax; going to see endocrinologist for DM and osteoporosis

## 2018-06-24 NOTE — Assessment & Plan Note (Signed)
Check liver and kidneys 

## 2018-06-25 ENCOUNTER — Telehealth: Payer: Self-pay | Admitting: Family Medicine

## 2018-06-25 DIAGNOSIS — M79621 Pain in right upper arm: Secondary | ICD-10-CM

## 2018-06-25 LAB — HEMOGLOBIN A1C
HEMOGLOBIN A1C: 9.7 %{Hb} — AB (ref <5.7)
MEAN PLASMA GLUCOSE: 232 (calc)
eAG (mmol/L): 12.8 (calc)

## 2018-06-25 LAB — VITAMIN D 25 HYDROXY (VIT D DEFICIENCY, FRACTURES): Vit D, 25-Hydroxy: 38 ng/mL (ref 30–100)

## 2018-06-25 LAB — LIPID PANEL
CHOLESTEROL: 152 mg/dL (ref <200)
HDL: 46 mg/dL — AB (ref >50)
LDL CHOLESTEROL (CALC): 87 mg/dL
Non-HDL Cholesterol (Calc): 106 mg/dL (calc) (ref <130)
TRIGLYCERIDES: 94 mg/dL (ref <150)
Total CHOL/HDL Ratio: 3.3 (calc) (ref <5.0)

## 2018-06-25 LAB — COMPLETE METABOLIC PANEL WITH GFR
AG RATIO: 1.7 (calc) (ref 1.0–2.5)
ALT: 14 U/L (ref 6–29)
AST: 15 U/L (ref 10–35)
Albumin: 4 g/dL (ref 3.6–5.1)
Alkaline phosphatase (APISO): 80 U/L (ref 33–130)
BILIRUBIN TOTAL: 0.4 mg/dL (ref 0.2–1.2)
BUN: 23 mg/dL (ref 7–25)
CHLORIDE: 104 mmol/L (ref 98–110)
CO2: 26 mmol/L (ref 20–32)
Calcium: 9.6 mg/dL (ref 8.6–10.4)
Creat: 0.89 mg/dL (ref 0.60–0.93)
GFR, Est African American: 76 mL/min/{1.73_m2} (ref >=60)
GFR, Est Non African American: 66 mL/min/{1.73_m2} (ref >=60)
GLUCOSE: 216 mg/dL — AB (ref 65–99)
Globulin: 2.4 g/dL (calc) (ref 1.9–3.7)
POTASSIUM: 4.1 mmol/L (ref 3.5–5.3)
Sodium: 141 mmol/L (ref 135–146)
TOTAL PROTEIN: 6.4 g/dL (ref 6.1–8.1)

## 2018-06-25 NOTE — Telephone Encounter (Signed)
Copied from [place] [phone]. Topic: Quick Communication - See Telephone Encounter >> Jun 25, 2018 10:41 AM Nils Flack wrote: CRM for notification. See Telephone encounter for: 06/25/18. Per [place] Poli, pt needs IMG [id number] added to the ultrasound

## 2018-06-25 NOTE — Telephone Encounter (Signed)
It looks like it has been ordered

## 2018-06-26 ENCOUNTER — Other Ambulatory Visit: Payer: Self-pay | Admitting: Family Medicine

## 2018-06-26 MED ORDER — ATORVASTATIN CALCIUM 20 MG PO TABS: 20.0000 mg | ORAL_TABLET | Freq: Every day | ORAL | 0 refills | Status: DC

## 2018-06-26 NOTE — Progress Notes (Signed)
Recommend combo injectable therapy (insulin plus GLP-1); appt for patient to come in and receive teaching, get started Increase statin

## 2018-07-03 ENCOUNTER — Other Ambulatory Visit: Payer: Medicare Other

## 2018-07-03 ENCOUNTER — Encounter: Payer: Self-pay | Admitting: Emergency Medicine

## 2018-07-03 ENCOUNTER — Emergency Department: Payer: Medicare Other

## 2018-07-03 ENCOUNTER — Emergency Department
Admission: EM | Admit: 2018-07-03 | Discharge: 2018-07-03 | Disposition: A | Discharge status: Home / Self Care | Payer: Medicare Other | Attending: Emergency Medicine | Admitting: Emergency Medicine

## 2018-07-03 ENCOUNTER — Other Ambulatory Visit: Payer: Self-pay

## 2018-07-03 DIAGNOSIS — Z79899 Other long term (current) drug therapy: Secondary | ICD-10-CM | POA: Insufficient documentation

## 2018-07-03 DIAGNOSIS — Z7982 Long term (current) use of aspirin: Secondary | ICD-10-CM | POA: Diagnosis not present

## 2018-07-03 DIAGNOSIS — I129 Hypertensive chronic kidney disease with stage 1 through stage 4 chronic kidney disease, or unspecified chronic kidney disease: Secondary | ICD-10-CM | POA: Insufficient documentation

## 2018-07-03 DIAGNOSIS — N183 Chronic kidney disease, stage 3 (moderate): Secondary | ICD-10-CM | POA: Insufficient documentation

## 2018-07-03 DIAGNOSIS — E1122 Type 2 diabetes mellitus with diabetic chronic kidney disease: Secondary | ICD-10-CM | POA: Insufficient documentation

## 2018-07-03 DIAGNOSIS — Z7984 Long term (current) use of oral hypoglycemic drugs: Secondary | ICD-10-CM | POA: Diagnosis not present

## 2018-07-03 DIAGNOSIS — R51 Headache: Secondary | ICD-10-CM

## 2018-07-03 DIAGNOSIS — R000 Tachycardia, unspecified: Secondary | ICD-10-CM | POA: Diagnosis not present

## 2018-07-03 DIAGNOSIS — R519 Headache, unspecified: Secondary | ICD-10-CM

## 2018-07-03 DIAGNOSIS — E860 Dehydration: Secondary | ICD-10-CM | POA: Insufficient documentation

## 2018-07-03 LAB — DIFFERENTIAL
Basophils Absolute: 0.1 10*3/uL (ref 0–0.1)
Basophils Relative: 1 %
EOS PCT: 3 %
Eosinophils Absolute: 0.2 10*3/uL (ref 0–0.7)
LYMPHS PCT: 21 %
Lymphs Abs: 1.8 10*3/uL (ref 1.0–3.6)
Monocytes Absolute: 0.5 10*3/uL (ref 0.2–0.9)
Monocytes Relative: 6 %
NEUTROS PCT: 69 %
Neutro Abs: 5.9 10*3/uL (ref 1.4–6.5)

## 2018-07-03 LAB — URINE DRUG SCREEN, QUALITATIVE (ARMC ONLY)
Amphetamines, Ur Screen: NOT DETECTED
BARBITURATES, UR SCREEN: NOT DETECTED
Benzodiazepine, Ur Scrn: NOT DETECTED
CANNABINOID 50 NG, UR ~~LOC~~: NOT DETECTED
COCAINE METABOLITE, UR ~~LOC~~: NOT DETECTED
MDMA (Ecstasy)Ur Screen: NOT DETECTED
Methadone Scn, Ur: NOT DETECTED
Opiate, Ur Screen: POSITIVE — AB
Phencyclidine (PCP) Ur S: NOT DETECTED
Tricyclic, Ur Screen: NOT DETECTED

## 2018-07-03 LAB — PROTIME-INR
INR: 0.96
PROTHROMBIN TIME: 12.7 s (ref 11.4–15.2)

## 2018-07-03 LAB — CBC
HCT: 41.1 % (ref 35.0–47.0)
Hemoglobin: 13.8 g/dL (ref 12.0–16.0)
MCH: 27.4 pg (ref 26.0–34.0)
MCHC: 33.5 g/dL (ref 32.0–36.0)
MCV: 81.7 fL (ref 80.0–100.0)
PLATELETS: 141 10*3/uL — AB (ref 150–440)
RBC: 5.04 MIL/uL (ref 3.80–5.20)
RDW: 16.4 % — ABNORMAL HIGH (ref 11.5–14.5)
WBC: 8.5 10*3/uL (ref 3.6–11.0)

## 2018-07-03 LAB — APTT: aPTT: 26 seconds (ref 24–36)

## 2018-07-03 LAB — COMPREHENSIVE METABOLIC PANEL
ALBUMIN: 4.1 g/dL (ref 3.5–5.0)
ALK PHOS: 67 U/L (ref 38–126)
ALT: 18 U/L (ref 0–44)
AST: 22 U/L (ref 15–41)
Anion gap: 10 (ref 5–15)
BILIRUBIN TOTAL: 0.6 mg/dL (ref 0.3–1.2)
BUN: 25 mg/dL — ABNORMAL HIGH (ref 8–23)
CALCIUM: 9.4 mg/dL (ref 8.9–10.3)
CO2: 24 mmol/L (ref 22–32)
CREATININE: 0.99 mg/dL (ref 0.44–1.00)
Chloride: 105 mmol/L (ref 98–111)
GFR calc non Af Amer: 56 mL/min — ABNORMAL LOW (ref >60)
Glucose, Bld: 148 mg/dL — ABNORMAL HIGH (ref 70–99)
Potassium: 3.7 mmol/L (ref 3.5–5.1)
SODIUM: 139 mmol/L (ref 135–145)
Total Protein: 7.3 g/dL (ref 6.5–8.1)

## 2018-07-03 LAB — TROPONIN I: Troponin I: <0.03 ng/mL (ref <0.03)

## 2018-07-03 LAB — URINALYSIS, ROUTINE W REFLEX MICROSCOPIC
BACTERIA UA: NONE SEEN
Bilirubin Urine: NEGATIVE
Glucose, UA: NEGATIVE mg/dL
Ketones, ur: 5 mg/dL — AB
Leukocytes, UA: NEGATIVE
Nitrite: NEGATIVE
PROTEIN: 100 mg/dL — AB
Specific Gravity, Urine: 1.018 (ref 1.005–1.030)
pH: 5 (ref 5.0–8.0)

## 2018-07-03 LAB — ETHANOL

## 2018-07-03 LAB — GLUCOSE, CAPILLARY: GLUCOSE-CAPILLARY: 122 mg/dL — AB (ref 70–99)

## 2018-07-03 MED ORDER — SODIUM CHLORIDE 0.9 % IV BOLUS
500.0000 mL | Freq: Once | INTRAVENOUS | Status: AC
  Administered 2018-07-03: 500 mL via INTRAVENOUS

## 2018-07-03 NOTE — ED Provider Notes (Addendum)
[facility] Emergency Department Provider Note  ___________________________________________   First MD Initiated Contact with Patient 07/03/18 1902     (approximate)  I have reviewed the triage vital signs and the nursing notes.   HISTORY  Chief Complaint Weakness   HPI Julie [name] is a 72 y.o. female with a history of CKD, obesity hyperlipidemia and hypertension was presenting to the emergency department today complaining of several days of left frontal headache as well as feeling very jittery and then over the past hour having difficulty finding her words.  She denies any focal weakness or slurred speech.  Says that she has been very stressed lately because of taking care of her chronically ill husband.  She states that she only gets approximately 5 hours of sleep per night.  Says that her headache is a 5 out of 10 at this time.  Denies any pain with opening and closing of her jaw.  Denies any visual changes.  Past Medical History:  Diagnosis Date  . Arthritis   . Cataract   . CKD (chronic kidney disease) stage 2, GFR 60-89 ml/min   . Diabetes mellitus without complication ([place])   . Hyperlipidemia   . Hypertension   . Morbid obesity ([place])   . Osteoporosis March 2015  . Proteinuria Jan 2015   referred to Nephrology    Patient Active Problem List   Diagnosis Date Noted  . Stool guaiac positive 06/03/2017  . Dermatitis 01/04/2017  . Low back pain 06/06/2016  . Medication monitoring encounter 11/30/2015  . Varicose vein of leg 11/30/2015  . Abnormal mammogram 08/04/2015  . Cardiac murmur 07/28/2015  . Hypertension   . Hyperlipidemia   . Diabetes mellitus with proliferative retinopathy ([place])   . CKD (chronic kidney disease) stage 2, GFR 60-89 ml/min   . Morbid obesity ([place])   . Osteoporosis   . Proteinuria 10/15/2013    Past Surgical History:  Procedure Laterality Date  . COLONOSCOPY WITH PROPOFOL N/A 07/16/2017   Procedure:  COLONOSCOPY WITH PROPOFOL;  Surgeon: Robert Bellow, MD;  Location: [facility] ENDOSCOPY;  Service: Endoscopy;  Laterality: N/A;  . DILATION AND CURETTAGE OF UTERUS  2007  . ESOPHAGOGASTRODUODENOSCOPY (EGD) WITH PROPOFOL N/A 07/16/2017   Procedure: ESOPHAGOGASTRODUODENOSCOPY (EGD) WITH PROPOFOL;  Surgeon: Robert Bellow, MD;  Location: [place] ENDOSCOPY;  Service: Endoscopy;  Laterality: N/A;  . MOUTH SURGERY  2007   cyst  . TONSILLECTOMY      Prior to Admission medications   Medication Sig Start Date End Date Taking? Authorizing Provider  amLODipine (NORVASC) 10 MG tablet TAKE 1 TABLET BY MOUTH  DAILY 06/09/18   Poulose, Bethel Born, NP  aspirin 81 MG tablet Take 81 mg by mouth daily.    [provider]  atorvastatin (LIPITOR) 20 MG tablet Take 1 tablet (20 mg total) by mouth at bedtime. 06/26/18   Arnetha Courser, MD  bevacizumab (AVASTIN) 1.25 mg/0.1 mL SOLN 10 mg by Intravitreal route to Surgery.    [provider]  Cholecalciferol (VITAMIN D-1000 MAX ST) 1000 UNITS tablet Take 1,000 Units by mouth daily.    [provider]  cyclobenzaprine (FLEXERIL) 5 MG tablet Take 1 tablet (5 mg total) by mouth every 8 (eight) hours as needed for muscle spasms. 03/20/18   Lada, Satira Anis, MD  glipiZIDE (GLUCOTROL XL) 10 MG 24 hr tablet TAKE 1 TABLET BY MOUTH  TWICE A DAY 08/15/17   Lada, Satira Anis, MD  hydrochlorothiazide (HYDRODIURIL) 25 MG tablet  TAKE 1 TABLET BY MOUTH  DAILY 04/18/18   Lada, Satira Anis, MD  lisinopril (PRINIVIL,ZESTRIL) 40 MG tablet TAKE 1 TABLET BY MOUTH  DAILY 04/18/18   Lada, Satira Anis, MD  metFORMIN (GLUCOPHAGE) 500 MG tablet TAKE 2 TABLETS BY MOUTH TWO TIMES DAILY 04/18/18   Lada, Satira Anis, MD  Multiple Vitamin (MULTI-VITAMINS) TABS Take by mouth daily.     [provider]  Omega-3 Fatty Acids (FISH OIL) 1000 MG CAPS Take 1,000 mg by mouth 2 (two) times daily.    [provider]    Allergies Nsaids and Penicillins  Family History  Problem  Relation Age of Onset  . Glaucoma Mother   . Cancer Father        liver  . Cancer Sister        lung, bone  . COPD Sister   . Heart disease Maternal Grandmother        chf  . Hypertension Brother   . Hypertension Maternal Uncle   . Ovarian cancer Paternal Grandmother   . Cancer Paternal Grandmother        ovarian cancer  . Breast cancer Neg Hx     Social History Social History   Tobacco Use  . Smoking status: Never Smoker  . Smokeless tobacco: Never Used  . Tobacco comment: smoking cessation materials not required  Substance Use Topics  . Alcohol use: No  . Drug use: No    Review of Systems  Constitutional: No fever/chills Eyes: No visual changes. ENT: No sore throat. Cardiovascular: Denies chest pain. Respiratory: Denies shortness of breath. Gastrointestinal: No abdominal pain.  No nausea, no vomiting.  No diarrhea.  No constipation. Genitourinary: Negative for dysuria. Musculoskeletal: Negative for back pain. Skin: Negative for rash. Neurological: As above   ____________________________________________   PHYSICAL EXAM:  VITAL SIGNS: ED Triage Vitals  Enc Vitals Group     BP 07/03/18 1859 (!) 168/74     Pulse Rate 07/03/18 1859 (!) 101     Resp 07/03/18 1859 20     Temp 07/03/18 1859 98.3 F (36.8 C)     Temp Source 07/03/18 1859 Oral     SpO2 07/03/18 1859 97 %     Weight 07/03/18 1900 282 lb 3 oz (128 kg)     Height 07/03/18 1900 5\' 4"  (1.626 m)     Head Circumference --      Peak Flow --      Pain Score 07/03/18 1900 8     Pain Loc --      Pain Edu? --      Excl. in [place]? --     Constitutional: Alert and oriented. Well appearing and in no acute distress. Eyes: Conjunctivae are normal.  Head: Atraumatic.  No tenderness or nodularity along the distribution of the temporal arteries. Nose: No congestion/rhinnorhea. Mouth/Throat: Mucous membranes are moist.  Neck: No stridor.   Cardiovascular: Normal rate, regular rhythm. Grossly normal heart  sounds.   Respiratory: Normal respiratory effort.  No retractions. Lungs CTAB. Gastrointestinal: Soft and nontender. No distention.  Musculoskeletal: No lower extremity tenderness nor edema.  No joint effusions. Neurologic:  Normal speech and language. No gross focal neurologic deficits are appreciated. Skin:  Skin is warm, dry and intact. No rash noted. Psychiatric: Mood and affect are normal. Speech and behavior are normal.  NIH Stroke Scale  Person Administering Scale: Doran Stabler  Administer stroke scale items in the order listed. Record performance in each category after each subscale  exam. Do not go back and change scores. Follow directions provided for each exam technique. Scores should reflect what the patient does, not what the clinician thinks the patient can do. The clinician should record answers while administering the exam and work quickly. Except where indicated, the patient should not be coached (i.e., repeated requests to patient to make a special effort).   1a  Level of consciousness: 0=alert; keenly responsive  1b. LOC questions:  0=Performs both tasks correctly  1c. LOC commands: 0=Performs both tasks correctly  2.  Best Gaze: 0=normal  3.  Visual: 0=No visual loss  4. Facial Palsy: 0=Normal symmetric movement  5a.  Motor left arm: 0=No drift, limb holds 90 (or 45) degrees for full 10 seconds  5b.  Motor right arm: 0=No drift, limb holds 90 (or 45) degrees for full 10 seconds  6a. motor left leg: 0=No drift, limb holds 90 (or 45) degrees for full 10 seconds  6b  Motor right leg:  0=No drift, limb holds 90 (or 45) degrees for full 10 seconds  7. Limb Ataxia: 0=Absent  8.  Sensory: 0=Normal; no sensory loss  9. Best Language:  0=No aphasia, normal  10. Dysarthria: 0=Normal  11. Extinction and Inattention: 0=No abnormality  12. Distal motor function: 0=Normal   Total:   0   ____________________________________________   LABS (all labs ordered are listed, but  only abnormal results are displayed)  Labs Reviewed  GLUCOSE, CAPILLARY - Abnormal; Notable for the following components:      Result Value   Glucose-Capillary 122 (*)    All other components within normal limits  ETHANOL  PROTIME-INR  APTT  CBC  DIFFERENTIAL  COMPREHENSIVE METABOLIC PANEL  TROPONIN I  URINE DRUG SCREEN, QUALITATIVE ([facility] ONLY)  URINALYSIS, ROUTINE W REFLEX MICROSCOPIC   ____________________________________________  EKG  ED ECG REPORT I, Doran Stabler, the attending physician, personally viewed and interpreted this ECG.   Date: 07/03/2018  EKG Time: 1919  Rate: 101  Rhythm: sinus tachycardia  Axis: Normal  Intervals:none  ST&T Change: No ST segment elevation or depression.  No abnormal T wave inversion. ____________________________________________  RADIOLOGY  No acute finding on the chest x-ray.  CT without acute finding. ____________________________________________   PROCEDURES  Procedure(s) performed: None  Procedures  Critical Care performed: No  ____________________________________________   INITIAL IMPRESSION / ASSESSMENT AND PLAN / ED COURSE  Pertinent labs & imaging results that were available during my care of the patient were reviewed by me and considered in my medical decision making (see chart for details).  Differential diagnosis includes, but is not limited to, alcohol, illicit or prescription medications, or other toxic ingestion; intracranial pathology such as stroke or intracerebral hemorrhage; fever or infectious causes including sepsis; hypoxemia and/or hypercarbia; uremia; trauma; endocrine related disorders such as diabetes, hypoglycemia, and thyroid-related diseases; hypertensive encephalopathy; etc. As part of my medical decision making, I reviewed the following data within the electronic MEDICAL RECORD NUMBER Notes from prior ED visits  ----------------------------------------- 9:39 PM on  07/03/2018 -----------------------------------------  Patient states that she no longer feels shaky.  Headache tenderness to her 3 out of 10 at this time.  Patient says that she feels much improved.  Heart rate of 98 on the monitor in the room.  Possible mild dehydration and exhaustion coupled with patient very stressed because of taking care of her husband.  I do not see signs of severe illness at this time but will be discharged home.  Patient understand the diagnosis as  well as treatment and willing to comply. ____________________________________________   FINAL CLINICAL IMPRESSION(S) / ED DIAGNOSES  Dehydration.  Headache.  NEW MEDICATIONS STARTED DURING THIS VISIT:  New Prescriptions   No medications on file     Note:  This document was prepared using Dragon voice recognition software and may include unintentional dictation errors.     Orbie Pyo, MD 07/03/18 2139  Patient says that she did take a leftover opiate prescription that she had from knee pain that was remotely prescribed yesterday morning.  I advised her not to take anymore and she is understanding and willing to comply.    Orbie Pyo, MD 07/03/18 2224

## 2018-07-03 NOTE — ED Notes (Addendum)
Pt to the er for headache and pain in the neck. Pt says she is anxious and not sleeping well. Pt is in no distress. No facial droop or arm drift. Pt is able to follow directions.

## 2018-07-03 NOTE — ED Triage Notes (Signed)
Patient to ER for c/o generalized weakness x1 hour. Patient states she feels like she is having trouble getting her words out. Also reports having shaking that began approx 1 hour ago. Denies any unilateral weakness at any point.

## 2018-07-04 ENCOUNTER — Ambulatory Visit
Admission: RE | Admit: 2018-07-04 | Discharge: 2018-07-04 | Disposition: A | Discharge status: Home / Self Care | Payer: Medicare Other | Source: Ambulatory Visit | Attending: Family Medicine | Admitting: Family Medicine

## 2018-07-04 DIAGNOSIS — N644 Mastodynia: Secondary | ICD-10-CM | POA: Diagnosis not present

## 2018-07-04 DIAGNOSIS — R928 Other abnormal and inconclusive findings on diagnostic imaging of breast: Secondary | ICD-10-CM | POA: Diagnosis not present

## 2018-07-04 DIAGNOSIS — M79621 Pain in right upper arm: Secondary | ICD-10-CM | POA: Insufficient documentation

## 2018-07-09 DIAGNOSIS — E113212 Type 2 diabetes mellitus with mild nonproliferative diabetic retinopathy with macular edema, left eye: Secondary | ICD-10-CM | POA: Diagnosis not present

## 2018-07-14 DIAGNOSIS — E113311 Type 2 diabetes mellitus with moderate nonproliferative diabetic retinopathy with macular edema, right eye: Secondary | ICD-10-CM | POA: Diagnosis not present

## 2018-07-28 ENCOUNTER — Other Ambulatory Visit: Payer: Self-pay | Admitting: Family Medicine

## 2018-07-29 DIAGNOSIS — M810 Age-related osteoporosis without current pathological fracture: Secondary | ICD-10-CM | POA: Insufficient documentation

## 2018-07-29 DIAGNOSIS — E1129 Type 2 diabetes mellitus with other diabetic kidney complication: Secondary | ICD-10-CM | POA: Diagnosis not present

## 2018-07-29 DIAGNOSIS — I10 Essential (primary) hypertension: Secondary | ICD-10-CM | POA: Insufficient documentation

## 2018-07-29 DIAGNOSIS — E1169 Type 2 diabetes mellitus with other specified complication: Secondary | ICD-10-CM | POA: Insufficient documentation

## 2018-07-29 DIAGNOSIS — R809 Proteinuria, unspecified: Secondary | ICD-10-CM | POA: Diagnosis not present

## 2018-07-29 DIAGNOSIS — E1159 Type 2 diabetes mellitus with other circulatory complications: Secondary | ICD-10-CM | POA: Insufficient documentation

## 2018-07-29 DIAGNOSIS — E785 Hyperlipidemia, unspecified: Secondary | ICD-10-CM | POA: Insufficient documentation

## 2018-08-06 DIAGNOSIS — E113212 Type 2 diabetes mellitus with mild nonproliferative diabetic retinopathy with macular edema, left eye: Secondary | ICD-10-CM | POA: Diagnosis not present

## 2018-08-14 ENCOUNTER — Other Ambulatory Visit: Payer: Self-pay | Admitting: Family Medicine

## 2018-09-01 DIAGNOSIS — E113311 Type 2 diabetes mellitus with moderate nonproliferative diabetic retinopathy with macular edema, right eye: Secondary | ICD-10-CM | POA: Diagnosis not present

## 2018-09-04 DIAGNOSIS — E113212 Type 2 diabetes mellitus with mild nonproliferative diabetic retinopathy with macular edema, left eye: Secondary | ICD-10-CM | POA: Diagnosis not present

## 2018-09-23 ENCOUNTER — Ambulatory Visit (INDEPENDENT_AMBULATORY_CARE_PROVIDER_SITE_OTHER): Payer: Medicare Other | Admitting: Family Medicine

## 2018-09-23 ENCOUNTER — Encounter: Payer: Self-pay | Admitting: Family Medicine

## 2018-09-23 VITALS — BP 136/76 | HR 94 | Temp 97.8°F | Ht 64.0 in | Wt 278.2 lb

## 2018-09-23 DIAGNOSIS — E113513 Type 2 diabetes mellitus with proliferative diabetic retinopathy with macular edema, bilateral: Secondary | ICD-10-CM | POA: Diagnosis not present

## 2018-09-23 DIAGNOSIS — I10 Essential (primary) hypertension: Secondary | ICD-10-CM | POA: Diagnosis not present

## 2018-09-23 DIAGNOSIS — N182 Chronic kidney disease, stage 2 (mild): Secondary | ICD-10-CM

## 2018-09-23 DIAGNOSIS — E782 Mixed hyperlipidemia: Secondary | ICD-10-CM | POA: Diagnosis not present

## 2018-09-23 DIAGNOSIS — R600 Localized edema: Secondary | ICD-10-CM

## 2018-09-23 NOTE — Assessment & Plan Note (Signed)
Check lipids to see if LDL down to goal; HDL can be helped with weight loss and walking

## 2018-09-23 NOTE — Assessment & Plan Note (Signed)
Avoid NSAIDs 

## 2018-09-23 NOTE — Progress Notes (Signed)
BP 136/76   Pulse 94   Temp 97.8 F (36.6 C) (Oral)   Ht 5\' 4"  (1.626 m)   Wt 278 lb 3.2 oz (126.2 kg)   SpO2 98%   BMI 47.75 kg/m    Subjective:    Patient ID: Julie [name], female    DOB: [birth year], 72 y.o.   MRN: [id number]  HPI: Julie [name] is a 72 y.o. female  Chief Complaint  Patient presents with  . Follow-up    HPI Here for f/u  Type 2 diabetes mellitus; seeing specialist now, Dr. Honor Junes; he started her on generic actos No new problems with feet Checking FSBS usually once a day; checking after big meal or first thing, running around 100 in the morning, 120s highest am; highest FSBS in the last month was around 250 or so; not having dry mouth often  She got dehydrated and thought she was having a stroke and went to the ER; couldn't think clearly; she had been dehydrated and had weakness; went to the ER on 07/03/18; she took an old left over hydrocodone the day before for pain, neck pain and headache  High cholesterol; has been on 20 mg of atorvastatin for about a month now; does eat some fried foods; almost always bakes and grills food with olive oil; goes out and has hamburger; trying to cook smaller portions; drinking some water, but trying to drink more; drinks decaf tea with artificial sweeteners at home; not many sodas, one yesterday  Lab Results  Component Value Date   CHOL 152 06/24/2018   HDL 46 (L) 06/24/2018   LDLCALC 87 06/24/2018   TRIG 94 06/24/2018   CHOLHDL 3.3 06/24/2018   Is going to have 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; had one removed 6-8 years ago; size of green pea, removed; this one is going to need to come out; osteoporosis and the medicine is causing problem; managed by specialist  CKD; just had labs in the ER a few months ago  Lab Results  Component Value Date   CREATININE 0.99 07/03/2018     Depression screen [facility] 2/9 09/23/2018 06/20/2018 03/14/2018 12/06/2017 04/23/2017  Decreased Interest 0 1 0 0 0  Down, Depressed,  Hopeless 1 1 0 0 0  PHQ - 2 Score 1 2 0 0 0  Altered sleeping 2 3 - - -  Tired, decreased energy 1 0 - - -  Change in appetite 2 3 - - -  Feeling bad or failure about yourself  0 1 - - -  Trouble concentrating 0 0 - - -  Moving slowly or fidgety/restless 0 0 - - -  Suicidal thoughts 0 0 - - -  PHQ-9 Score 6 9 - - -  Difficult doing work/chores Not difficult at all Somewhat difficult - - -   Fall Risk  09/23/2018 06/20/2018 03/14/2018 12/06/2017 04/23/2017  Falls in the past year? 1 Yes No No Yes  Number falls in past yr: 0 1 - - 1  Injury with Fall? 0 Yes - - No  Risk Factor Category  - High Fall Risk - - -  Risk for fall due to : - Impaired vision;Impaired balance/gait - - -  Risk for fall due to: Comment - wears eyeglasses; ambulates with cane; knee pain - - -  Follow up - Falls evaluation completed;Education provided;Falls prevention discussed - - -    Relevant past medical, surgical, family and social history reviewed Past Medical History:  Diagnosis Date  .  Arthritis   . Cataract   . CKD (chronic kidney disease) stage 2, GFR 60-89 ml/min   . Diabetes mellitus without complication ([place])   . Hyperlipidemia   . Hypertension   . Morbid obesity ([place])   . Osteoporosis March 2015  . Proteinuria Jan 2015   referred to Nephrology   Past Surgical History:  Procedure Laterality Date  . COLONOSCOPY WITH PROPOFOL N/A 07/16/2017   Procedure: COLONOSCOPY WITH PROPOFOL;  Surgeon: Robert Bellow, MD;  Location: [facility] ENDOSCOPY;  Service: Endoscopy;  Laterality: N/A;  . DILATION AND CURETTAGE OF UTERUS  2007  . ESOPHAGOGASTRODUODENOSCOPY (EGD) WITH PROPOFOL N/A 07/16/2017   Procedure: ESOPHAGOGASTRODUODENOSCOPY (EGD) WITH PROPOFOL;  Surgeon: Robert Bellow, MD;  Location: [facility]OSCOPY;  Service: Endoscopy;  Laterality: N/A;  . MOUTH SURGERY  2007   cyst  . TONSILLECTOMY     Family History  Problem Relation Age of Onset  . Glaucoma Mother   . Cancer Father        liver  .  Cancer Sister        lung, bone  . COPD Sister   . Heart disease Maternal Grandmother        chf  . Hypertension Brother   . Hypertension Maternal Uncle   . Ovarian cancer Paternal Grandmother   . Cancer Paternal Grandmother        ovarian cancer  . Breast cancer Neg Hx    Social History   Tobacco Use  . Smoking status: Never Smoker  . Smokeless tobacco: Never Used  . Tobacco comment: smoking cessation materials not required  Substance Use Topics  . Alcohol use: No  . Drug use: No     [facility] Visit from 09/23/2018 [facility]  AUDIT-C Score  0      Interim medical history since last visit reviewed. Allergies and medications reviewed  Review of Systems Per HPI unless specifically indicated above     Objective:    BP 136/76   Pulse 94   Temp 97.8 F (36.6 C) (Oral)   Ht 5\' 4"  (1.626 m)   Wt 278 lb 3.2 oz (126.2 kg)   SpO2 98%   BMI 47.75 kg/m   Wt Readings from Last 3 Encounters:  09/23/18 278 lb 3.2 oz (126.2 kg)  07/03/18 282 lb 3 oz (128 kg)  06/24/18 282 lb 1.6 oz (128 kg)    Physical Exam  Constitutional: She appears well-developed and well-nourished. No distress.  Morbidly obese  HENT:  Head: Normocephalic and atraumatic.  Eyes: EOM are normal. No scleral icterus.  Neck: No thyromegaly present.  Cardiovascular: Normal rate, regular rhythm and normal heart sounds.  No murmur heard. Pulmonary/Chest: Effort normal and breath sounds normal. No respiratory distress. She has no wheezes.  Abdominal: Soft. Bowel sounds are normal. She exhibits no distension.  Musculoskeletal: She exhibits edema (pitting edema B LE).  Neurological: She is alert.  Skin: Skin is warm and dry. She is not diaphoretic. No pallor.  Psychiatric: She has a normal mood and affect. Her behavior is normal. Judgment and thought content normal.   Diabetic Foot Form - Detailed   Diabetic Foot Exam - detailed Diabetic Foot exam was performed with the following  findings:  Yes 09/23/2018 10:10 AM  Visual Foot Exam completed.:  Yes  Pulse Foot Exam completed.:  Yes  Right Dorsalis Pedis:  Present Left Dorsalis Pedis:  Present  Sensory Foot Exam Completed.:  Yes Semmes-Weinstein Monofilament Test R Site 1-Great  Toe:  Pos L Site 1-Great Toe:  Pos        Results for orders placed or performed during the hospital encounter of 07/03/18  Glucose, capillary  Result Value Ref Range   Glucose-Capillary 122 (H) 70 - 99 mg/dL  Ethanol  Result Value Ref Range   Alcohol, Ethyl (B) <10 <10 mg/dL  Protime-INR  Result Value Ref Range   Prothrombin Time 12.7 11.4 - 15.2 seconds   INR 0.96   APTT  Result Value Ref Range   aPTT 26 24 - 36 seconds  CBC  Result Value Ref Range   WBC 8.5 3.6 - 11.0 K/uL   RBC 5.04 3.80 - 5.20 MIL/uL   Hemoglobin 13.8 12.0 - 16.0 g/dL   HCT 41.1 35.0 - 47.0 %   MCV 81.7 80.0 - 100.0 fL   MCH 27.4 26.0 - 34.0 pg   MCHC 33.5 32.0 - 36.0 g/dL   RDW 16.4 (H) 11.5 - 14.5 %   Platelets 141 (L) 150 - 440 K/uL  Differential  Result Value Ref Range   Neutrophils Relative % 69 %   Neutro Abs 5.9 1.4 - 6.5 K/uL   Lymphocytes Relative 21 %   Lymphs Abs 1.8 1.0 - 3.6 K/uL   Monocytes Relative 6 %   Monocytes Absolute 0.5 0.2 - 0.9 K/uL   Eosinophils Relative 3 %   Eosinophils Absolute 0.2 0 - 0.7 K/uL   Basophils Relative 1 %   Basophils Absolute 0.1 0 - 0.1 K/uL  Comprehensive metabolic panel  Result Value Ref Range   Sodium 139 135 - 145 mmol/L   Potassium 3.7 3.5 - 5.1 mmol/L   Chloride 105 98 - 111 mmol/L   CO2 24 22 - 32 mmol/L   Glucose, Bld 148 (H) 70 - 99 mg/dL   BUN 25 (H) 8 - 23 mg/dL   Creatinine, Ser 0.99 0.44 - 1.00 mg/dL   Calcium 9.4 8.9 - 10.3 mg/dL   Total Protein 7.3 6.5 - 8.1 g/dL   Albumin 4.1 3.5 - 5.0 g/dL   AST 22 15 - 41 U/L   ALT 18 0 - 44 U/L   Alkaline Phosphatase 67 38 - 126 U/L   Total Bilirubin 0.6 0.3 - 1.2 mg/dL   GFR calc non Af Amer 56 (L) >60 mL/min   GFR calc Af Amer >60 >60  mL/min   Anion gap 10 5 - 15  Troponin I  Result Value Ref Range   Troponin I <0.03 <0.03 ng/mL  Urine Drug Screen, Qualitative  Result Value Ref Range   Tricyclic, Ur Screen NONE DETECTED NONE DETECTED   Amphetamines, Ur Screen NONE DETECTED NONE DETECTED   MDMA (Ecstasy)Ur Screen NONE DETECTED NONE DETECTED   Cocaine Metabolite,Ur [place] NONE DETECTED NONE DETECTED   Opiate, Ur Screen POSITIVE (A) NONE DETECTED   Phencyclidine (PCP) Ur S NONE DETECTED NONE DETECTED   Cannabinoid 50 Ng, Ur [place] NONE DETECTED NONE DETECTED   Barbiturates, Ur Screen NONE DETECTED NONE DETECTED   Benzodiazepine, Ur Scrn NONE DETECTED NONE DETECTED   Methadone Scn, Ur NONE DETECTED NONE DETECTED  Urinalysis, Routine w reflex microscopic  Result Value Ref Range   Color, Urine YELLOW (A) YELLOW   APPearance CLEAR (A) CLEAR   Specific Gravity, Urine 1.018 1.005 - 1.030   pH 5.0 5.0 - 8.0   Glucose, UA NEGATIVE NEGATIVE mg/dL   Hgb urine dipstick SMALL (A) NEGATIVE   Bilirubin Urine NEGATIVE NEGATIVE   Ketones, ur  5 (A) NEGATIVE mg/dL   Protein, ur 100 (A) NEGATIVE mg/dL   Nitrite NEGATIVE NEGATIVE   Leukocytes, UA NEGATIVE NEGATIVE   RBC / HPF 0-5 0 - 5 RBC/hpf   WBC, UA 0-5 0 - 5 WBC/hpf   Bacteria, UA NONE SEEN NONE SEEN   Squamous Epithelial / LPF 0-5 0 - 5   Mucus PRESENT    Hyaline Casts, UA PRESENT       Assessment & Plan:   Problem List Items Addressed This Visit      Cardiovascular and Mediastinum   Hypertension - Primary (Chronic)    Encouraged DASH guidelines, less salt, less processed food; so close to goal; she will try to better        Endocrine   Diabetes mellitus with proliferative retinopathy (HCC) (Chronic)    Managed by Dr. Honor Junes; foot exam by MD today; seeing eye doctor monthly; encouragement given for healthy eating and weight loss      Relevant Medications   pioglitazone (ACTOS) 15 MG tablet     Genitourinary   CKD (chronic kidney disease) stage 2, GFR 60-89  ml/min    Avoid NSAIDs        Other   Morbid obesity (HCC) (Chronic)    See AVS; offered nutritionist referral, she did not accept      Relevant Medications   pioglitazone (ACTOS) 15 MG tablet   Hyperlipidemia (Chronic)    Check lipids to see if LDL down to goal; HDL can be helped with weight loss and walking      Relevant Orders   Lipid panel    Other Visit Diagnoses    Bilateral leg edema       she will talk to her endocrinologist about this edema; suggested compression stockings       Follow up plan: Return in about 4 weeks (around 10/21/2018) for fasting labs only; 3 months with Dr. Sanda Klein.  An after-visit summary was printed and given to the patient at [facility].  Please see the patient instructions which may contain other information and recommendations beyond what is mentioned above in the assessment and plan.  No orders of the defined types were placed in this encounter.   Orders Placed This Encounter  Procedures  . Lipid panel

## 2018-09-23 NOTE — Assessment & Plan Note (Signed)
Encouraged DASH guidelines, less salt, less processed food; so close to goal; she will try to better

## 2018-09-23 NOTE — Patient Instructions (Addendum)
Try to limit saturated fats in your diet (bologna, hot dogs, barbeque, cheeseburgers, hamburgers, steak, bacon, sausage, cheese, etc.) and get more fresh fruits, vegetables, and whole grains  Check out the information at familydoctor.org entitled "Nutrition for Weight Loss: What You Need to Know about Fad Diets" Try to lose between 1-2 pounds per week by taking in fewer calories and burning off more calories You can succeed by limiting portions, limiting foods dense in calories and fat, becoming more active, and drinking 8 glasses of water a day (64 ounces) Don't skip meals, especially breakfast, as skipping meals may alter your metabolism Do not use over-the-counter weight loss pills or gimmicks that claim rapid wei Return for fasting labs on or after October 21, 2018 You don't need an appointment. Just come by between the hours of 8:00 am and noon or between 2:00 pm and 4:00 pm Monday through Friday.   Obesity, Adult Obesity is the condition of having too much total body fat. Being overweight or obese means that your weight is greater than what is considered healthy for your body size. Obesity is determined by a measurement called BMI. BMI is an estimate of body fat and is calculated from height and weight. For adults, a BMI of 30 or higher is considered obese. Obesity can eventually lead to other health concerns and major illnesses, including:  Stroke.  Coronary artery disease (CAD).  Type 2 diabetes.  Some types of cancer, including cancers of the colon, breast, uterus, and gallbladder.  Osteoarthritis.  High blood pressure (hypertension).  High cholesterol.  Sleep apnea.  Gallbladder stones.  Infertility problems.  What are the causes? The main cause of obesity is taking in (consuming) more calories than your body uses for energy. Other factors that contribute to this condition may include:  Being born with genes that make you more likely to become obese.  Having a medical  condition that causes obesity. These conditions include: ? Hypothyroidism. ? Polycystic ovarian syndrome (PCOS). ? Binge-eating disorder. ? Cushing syndrome.  Taking certain medicines, such as steroids, antidepressants, and seizure medicines.  Not being physically active (sedentary lifestyle).  Living where there are limited places to exercise safely or buy healthy foods.  Not getting enough sleep.  What increases the risk? The following factors may increase your risk of this condition:  Having a family history of obesity.  Being a woman of African-American descent.  Being a man of Hispanic descent.  What are the signs or symptoms? Having excessive body fat is the main symptom of this condition. How is this diagnosed? This condition may be diagnosed based on:  Your symptoms.  Your medical history.  A physical exam. Your health care provider may measure: ? Your BMI. If you are an adult with a BMI between 25 and less than 30, you are considered overweight. If you are an adult with a BMI of 30 or higher, you are considered obese. ? The distances around your hips and your waist (circumferences). These may be compared to each other to help diagnose your condition. ? Your skinfold thickness. Your health care provider may gently pinch a fold of your skin and measure it.  How is this treated? Treatment for this condition often includes changing your lifestyle. Treatment may include some or all of the following:  Dietary changes. Work with your health care provider and a dietitian to set a weight-loss goal that is healthy and reasonable for you. Dietary changes may include eating: ? Smaller portions. A portion size  is the amount of a particular food that is healthy for you to eat at one time. This varies from person to person. ? Low-calorie or low-fat options. ? More whole grains, fruits, and vegetables.  Regular physical activity. This may include aerobic activity (cardio) and  strength training.  Medicine to help you lose weight. Your health care provider may prescribe medicine if you are unable to lose 1 pound a week after 6 weeks of eating more healthily and doing more physical activity.  Surgery. Surgical options may include gastric banding and gastric bypass. Surgery may be done if: ? Other treatments have not helped to improve your condition. ? You have a BMI of 40 or higher. ? You have life-threatening health problems related to obesity.  Follow these instructions at home:  Eating and drinking   Follow recommendations from your health care provider about what you eat and drink. Your health care provider may advise you to: ? Limit fast foods, sweets, and processed snack foods. ? Choose low-fat options, such as low-fat milk instead of whole milk. ? Eat 5 or more servings of fruits or vegetables every day. ? Eat at home more often. This gives you more control over what you eat. ? Choose healthy foods when you eat out. ? Learn what a healthy portion size is. ? Keep low-fat snacks on hand. ? Avoid sugary drinks, such as soda, fruit juice, iced tea sweetened with sugar, and flavored milk. ? Eat a healthy breakfast.  Drink enough water to keep your urine clear or pale yellow.  Do not go without eating for long periods of time (do not fast) or follow a fad diet. Fasting and fad diets can be unhealthy and even dangerous. Physical Activity  Exercise regularly, as told by your health care provider. Ask your health care provider what types of exercise are safe for you and how often you should exercise.  Warm up and stretch before being active.  Cool down and stretch after being active.  Rest between periods of activity. Lifestyle  Limit the time that you spend in front of your TV, computer, or video game system.  Find ways to reward yourself that do not involve food.  Limit alcohol intake to no more than 1 drink a day for nonpregnant women and 2 drinks  a day for men. One drink equals 12 oz of beer, 5 oz of wine, or 1 oz of hard liquor. General instructions  Keep a weight loss journal to keep track of the food you eat and how much you exercise you get.  Take over-the-counter and prescription medicines only as told by your health care provider.  Take vitamins and supplements only as told by your health care provider.  Consider joining a support group. Your health care provider may be able to recommend a support group.  Keep all follow-up visits as told by your health care provider. This is important. Contact a health care provider if:  You are unable to meet your weight loss goal after 6 weeks of dietary and lifestyle changes. This information is not intended to replace advice given to you by your health care provider. Make sure you discuss any questions you have with your health care provider. Document Released: 11/08/2004 Document Revised: 03/05/2016 Document Reviewed: 07/20/2015 Elsevier Interactive Patient Education  2018 [place].  Preventing Unhealthy [employer], Adult Staying at a healthy weight is important. When fat builds up in your body, you may become overweight or obese. These conditions put you  at greater risk for developing certain health problems, such as heart disease, diabetes, sleeping problems, joint problems, and some cancers. Unhealthy weight gain is often the result of making unhealthy choices in what you eat. It is also a result of not getting enough exercise. You can make changes to your lifestyle to prevent obesity and stay as healthy as possible. What nutrition changes can be made? To maintain a healthy weight and prevent obesity:  Eat only as much as your body needs. To do this: ? Pay attention to signs that you are hungry or full. Stop eating as soon as you feel full. ? If you feel hungry, try drinking water first. Drink enough water so your urine is clear or pale yellow. ? Eat smaller portions. ? Look  at serving sizes on food labels. Most foods contain more than one serving per container. ? Eat the recommended amount of calories for your gender and activity level. While most active people should eat around 2,000 calories per day, if you are trying to lose weight or are not very active, you main need to eat less calories. Talk to your health care provider or dietitian about how many calories you should eat each day.  Choose healthy foods, such as: ? Fruits and vegetables. Try to fill at least half of your plate at each meal with fruits and vegetables. ? Whole grains, such as whole wheat bread, brown rice, and quinoa. ? Lean meats, such as chicken or fish. ? Other healthy proteins, such as beans, eggs, or tofu. ? Healthy fats, such as nuts, seeds, fatty fish, and olive oil. ? Low-fat or fat-free dairy.  Check food labels and avoid food and drinks that: ? Are high in calories. ? Have added sugar. ? Are high in sodium. ? Have saturated fats or trans fats.  Limit how much you eat of the following foods: ? Prepackaged meals. ? Fast food. ? Fried foods. ? Processed meat, such as bacon, sausage, and deli meats. ? Fatty cuts of red meat and poultry with skin.  Cook foods in healthier ways, such as by baking, broiling, or grilling.  When grocery shopping, try to shop around the outside of the store. This helps you buy mostly fresh foods and avoid canned and prepackaged foods.  What lifestyle changes can be made?  Exercise at least 30 minutes 5 or more days each week. Exercising includes brisk walking, yard work, biking, running, swimming, and team sports like basketball and soccer. Ask your health care provider which exercises are safe for you.  Do not use any products that contain nicotine or tobacco, such as cigarettes and e-cigarettes. If you need help quitting, ask your health care provider.  Limit alcohol intake to no more than 1 drink a day for nonpregnant women and 2 drinks a day  for men. One drink equals 12 oz of beer, 5 oz of wine, or 1 oz of hard liquor.  Try to get 7-9 hours of sleep each night. What other changes can be made?  Keep a food and activity journal to keep track of: ? What you ate and how many calories you had. Remember to count sauces, dressings, and side dishes. ? Whether you were active, and what exercises you did. ? Your calorie, weight, and activity goals.  Check your weight regularly. Track any changes. If you notice you have gained weight, make changes to your diet or activity routine.  Avoid taking weight-loss medicines or supplements. Talk to your health care provider  before starting any new medicine or supplement.  Talk to your health care provider before trying any new diet or exercise plan. Why are these changes important? Eating healthy, staying active, and having healthy habits not only help prevent obesity, they also:  Help you to manage stress and emotions.  Help you to connect with friends and family.  Improve your self-esteem.  Improve your sleep.  Prevent long-term health problems.  What can happen if changes are not made? Being obese or overweight can cause you to develop joint or bone problems, which can make it hard for you to stay active or do activities you enjoy. Being obese or overweight also puts stress on your heart and lungs and can lead to health problems like diabetes, heart disease, and some cancers. Where to find more information: Talk with your health care provider or a dietitian about healthy eating and healthy lifestyle choices. You may also find other information through these resources:  U.S. Department of Agriculture MyPlate: FormerBoss.no  American Heart Association: www.heart.org  Centers for Disease Control and Prevention: http://www.wolf.info/  Summary  Staying at a healthy weight is important. It helps prevent certain diseases and health problems, such as heart disease, diabetes, joint  problems, sleep disorders, and some cancers.  Being obese or overweight can cause you to develop joint or bone problems, which can make it hard for you to stay active or do activities you enjoy.  You can prevent unhealthy weight gain by eating a healthy diet, exercising regularly, not smoking, limiting alcohol, and getting enough sleep.  Talk with your health care provider or a dietitian for guidance about healthy eating and healthy lifestyle choices. This information is not intended to replace advice given to you by your health care provider. Make sure you discuss any questions you have with your health care provider. Document Released: 10/02/2016 Document Revised: 11/07/2016 Document Reviewed: 11/07/2016 Elsevier Interactive Patient Education  [employer].

## 2018-09-23 NOTE — Assessment & Plan Note (Signed)
Managed by Dr. Honor Junes; foot exam by MD today; seeing eye doctor monthly; encouragement given for healthy eating and weight loss

## 2018-09-23 NOTE — Assessment & Plan Note (Signed)
See AVS; offered nutritionist referral, she did not accept

## 2018-10-03 ENCOUNTER — Telehealth: Payer: Self-pay

## 2018-10-03 DIAGNOSIS — E113212 Type 2 diabetes mellitus with mild nonproliferative diabetic retinopathy with macular edema, left eye: Secondary | ICD-10-CM | POA: Diagnosis not present

## 2018-10-03 NOTE — Telephone Encounter (Signed)
Pt notified will see how she does this weekend

## 2018-10-03 NOTE — Telephone Encounter (Signed)
Does she need to be seen?

## 2018-10-03 NOTE — Telephone Encounter (Signed)
She needs an evaluation, either here or urgent care or [facility]

## 2018-10-03 NOTE — Telephone Encounter (Signed)
Copied from [place] [id number]. Topic: General - Other >> Oct 03, 2018 12:00 PM Carolyn Stare wrote:  Pt call to say she is having issues with her sciatic nerve  and is asking if Dr Sanda Klein will call her in something    [place]

## 2018-10-20 DIAGNOSIS — E113311 Type 2 diabetes mellitus with moderate nonproliferative diabetic retinopathy with macular edema, right eye: Secondary | ICD-10-CM | POA: Diagnosis not present

## 2018-10-22 ENCOUNTER — Other Ambulatory Visit: Payer: Self-pay

## 2018-10-22 DIAGNOSIS — E782 Mixed hyperlipidemia: Secondary | ICD-10-CM | POA: Diagnosis not present

## 2018-10-22 LAB — LIPID PANEL
CHOL/HDL RATIO: 3.3 (calc) (ref <5.0)
CHOLESTEROL: 144 mg/dL (ref <200)
HDL: 44 mg/dL — AB (ref >50)
LDL Cholesterol (Calc): 83 mg/dL (calc)
Non-HDL Cholesterol (Calc): 100 mg/dL (calc) (ref <130)
TRIGLYCERIDES: 81 mg/dL (ref <150)

## 2018-10-27 ENCOUNTER — Other Ambulatory Visit: Payer: Self-pay | Admitting: Family Medicine

## 2018-10-27 DIAGNOSIS — E782 Mixed hyperlipidemia: Secondary | ICD-10-CM

## 2018-10-27 MED ORDER — ATORVASTATIN CALCIUM 40 MG PO TABS: 40.0000 mg | ORAL_TABLET | Freq: Every day | ORAL | 1 refills | Status: DC

## 2018-10-27 NOTE — Progress Notes (Signed)
Increase statin Return in 6 weeks for labs

## 2018-11-12 DIAGNOSIS — E113311 Type 2 diabetes mellitus with moderate nonproliferative diabetic retinopathy with macular edema, right eye: Secondary | ICD-10-CM | POA: Diagnosis not present

## 2018-12-01 DIAGNOSIS — L821 Other seborrheic keratosis: Secondary | ICD-10-CM | POA: Diagnosis not present

## 2018-12-01 DIAGNOSIS — L570 Actinic keratosis: Secondary | ICD-10-CM | POA: Diagnosis not present

## 2018-12-08 ENCOUNTER — Other Ambulatory Visit: Payer: Self-pay | Admitting: Family Medicine

## 2018-12-08 DIAGNOSIS — E113311 Type 2 diabetes mellitus with moderate nonproliferative diabetic retinopathy with macular edema, right eye: Secondary | ICD-10-CM | POA: Diagnosis not present

## 2018-12-10 ENCOUNTER — Other Ambulatory Visit: Payer: Self-pay | Admitting: Family Medicine

## 2018-12-15 DIAGNOSIS — E113212 Type 2 diabetes mellitus with mild nonproliferative diabetic retinopathy with macular edema, left eye: Secondary | ICD-10-CM | POA: Diagnosis not present

## 2018-12-23 ENCOUNTER — Encounter: Payer: Self-pay | Admitting: Family Medicine

## 2018-12-23 ENCOUNTER — Ambulatory Visit (INDEPENDENT_AMBULATORY_CARE_PROVIDER_SITE_OTHER): Payer: Medicare Other | Admitting: Family Medicine

## 2018-12-23 DIAGNOSIS — M810 Age-related osteoporosis without current pathological fracture: Secondary | ICD-10-CM | POA: Diagnosis not present

## 2018-12-23 DIAGNOSIS — E113513 Type 2 diabetes mellitus with proliferative diabetic retinopathy with macular edema, bilateral: Secondary | ICD-10-CM | POA: Diagnosis not present

## 2018-12-23 DIAGNOSIS — E782 Mixed hyperlipidemia: Secondary | ICD-10-CM | POA: Diagnosis not present

## 2018-12-23 LAB — LIPID PANEL
CHOL/HDL RATIO: 3.1 (calc) (ref <5.0)
Cholesterol: 135 mg/dL (ref <200)
HDL: 44 mg/dL — ABNORMAL LOW (ref >=50)
LDL Cholesterol (Calc): 72 mg/dL (calc)
Non-HDL Cholesterol (Calc): 91 mg/dL (calc) (ref <130)
Triglycerides: 105 mg/dL (ref <150)

## 2018-12-23 NOTE — Progress Notes (Signed)
BP 136/68   Pulse 94   Temp 98.2 F (36.8 C) (Oral)   Resp 12   Ht 5\' 4"  (1.626 m)   Wt 283 lb (128.4 kg)   SpO2 97%   BMI 48.58 kg/m    Subjective:    Patient ID: Julie [name], female    DOB: [birth year], 73 y.o.   MRN: [id number]  HPI: Julie [name] is a 73 y.o. female  Chief Complaint  Patient presents with  . Follow-up    HPI Here for f/u  Type 2 diabetes; she is seeing Dr. Honor Junes; no recent A1c; checking FSBS once a day per endo instructions; has a new monitor; range is "good" in the morning; now it's 85-132; spaghetti and starches and bread before that day it was 132; no problems with feet  She goes every 7 weeks for injection in the right eye and then every 4 weeks for the left eye; checking pressure and looking at the retina; [facility]  Her weight is up today; loves salty stuff; she is "ashamed of being so fat"; she does not think she is disciplined and eats the wrong foods and wrong amounts; she does not like fat; she knows about portions; she knows what to eat, but just doesn't do it; she stuffs it in; she does better when she doesn't have sweets around; 70 calories ice cream bars; I asked if food addiction; grew up with alcoholics and sometimes went for a few days as a child without food; uses food as a comfort  Osteoporosis; she will ask endocrinologist about that too; she used to see Dr. Gabriel Carina but has switched to a new endocrinologist; she is not taking any medicine for this now; she had to be stopped because of some reason, can't remember why; getting plenty of greens; does not use milk, has not tried almond milk  High cholesterol; she has been taking atorvastatin; she has never filled the 40 mg strength; has been taking 30 mg for maybe 3 weeks  Depression screen [facility] 2/9 12/23/2018 09/23/2018 06/20/2018 03/14/2018 12/06/2017  Decreased Interest 0 0 1 0 0  Down, Depressed, Hopeless 0 1 1 0 0  PHQ - 2 Score 0 1 2 0 0  Altered sleeping 0 2 3 - -  Tired,  decreased energy 0 1 0 - -  Change in appetite 0 2 3 - -  Feeling bad or failure about yourself  0 0 1 - -  Trouble concentrating 0 0 0 - -  Moving slowly or fidgety/restless 0 0 0 - -  Suicidal thoughts 0 0 0 - -  PHQ-9 Score 0 6 9 - -  Difficult doing work/chores Not difficult at all Not difficult at all Somewhat difficult - -  MD note: not depressed  Fall Risk  12/23/2018 09/23/2018 06/20/2018 03/14/2018 12/06/2017  Falls in the past year? 1 1 Yes No No  Number falls in past yr: 0 0 1 - -  Injury with Fall? 0 0 Yes - -  Risk Factor Category  - - High Fall Risk - -  Risk for fall due to : - - Impaired vision;Impaired balance/gait - -  Risk for fall due to: Comment - - wears eyeglasses; ambulates with cane; knee pain - -  Follow up - - Falls evaluation completed;Education provided;Falls prevention discussed - -    Relevant past medical, surgical, family and social history reviewed Past Medical History:  Diagnosis Date  . Arthritis   . Cataract   .  CKD (chronic kidney disease) stage 2, GFR 60-89 ml/min   . Diabetes mellitus without complication ([place])   . Hyperlipidemia   . Hypertension   . Morbid obesity ([place])   . Osteoporosis March 2015  . Proteinuria Jan 2015   referred to Nephrology   Past Surgical History:  Procedure Laterality Date  . COLONOSCOPY WITH PROPOFOL N/A 07/16/2017   Procedure: COLONOSCOPY WITH PROPOFOL;  Surgeon: Robert Bellow, MD;  Location: [facility] ENDOSCOPY;  Service: Endoscopy;  Laterality: N/A;  . DILATION AND CURETTAGE OF UTERUS  2007  . ESOPHAGOGASTRODUODENOSCOPY (EGD) WITH PROPOFOL N/A 07/16/2017   Procedure: ESOPHAGOGASTRODUODENOSCOPY (EGD) WITH PROPOFOL;  Surgeon: Robert Bellow, MD;  Location: [facility] ENDOSCOPY;  Service: Endoscopy;  Laterality: N/A;  . MOUTH SURGERY  2007   cyst  . TONSILLECTOMY     Family History  Problem Relation Age of Onset  . Glaucoma Mother   . Cancer Father        liver  . Cancer Sister        lung, bone  . COPD  Sister   . Heart disease Maternal Grandmother        chf  . Hypertension Brother   . Hypertension Maternal Uncle   . Ovarian cancer Paternal Grandmother   . Cancer Paternal Grandmother        ovarian cancer  . Breast cancer Neg Hx    Social History   Tobacco Use  . Smoking status: Never Smoker  . Smokeless tobacco: Never Used  . Tobacco comment: smoking cessation materials not required  Substance Use Topics  . Alcohol use: No  . Drug use: No     Office Visit from 12/23/2018 in [facility]  AUDIT-C Score  0      Interim medical history since last visit reviewed. Allergies and medications reviewed  Review of Systems Per HPI unless specifically indicated above     Objective:    BP 136/68   Pulse 94   Temp 98.2 F (36.8 C) (Oral)   Resp 12   Ht 5\' 4"  (1.626 m)   Wt 283 lb (128.4 kg)   SpO2 97%   BMI 48.58 kg/m   Wt Readings from Last 3 Encounters:  12/23/18 283 lb (128.4 kg)  09/23/18 278 lb 3.2 oz (126.2 kg)  07/03/18 282 lb 3 oz (128 kg)    Physical Exam Constitutional:      General: She is not in acute distress.    Appearance: She is well-developed. She is obese. She is not diaphoretic.  HENT:     Head: Normocephalic and atraumatic.  Eyes:     General: No scleral icterus. Neck:     Thyroid: No thyromegaly.  Cardiovascular:     Rate and Rhythm: Normal rate and regular rhythm.     Heart sounds: Normal heart sounds. No murmur.  Pulmonary:     Effort: Pulmonary effort is normal. No respiratory distress.     Breath sounds: Normal breath sounds. No wheezing.  Abdominal:     General: Bowel sounds are normal. There is no distension.     Palpations: Abdomen is soft.  Skin:    General: Skin is warm and dry.     Coloration: Skin is not pale.  Neurological:     Mental Status: She is alert.     Comments: Required assistance getting up on to the table due to her body habitus  Psychiatric:        Mood and Affect: Mood is  not anxious or  depressed.        Behavior: Behavior normal.        Thought Content: Thought content normal.        Judgment: Judgment normal.    Diabetic Foot Form - Detailed   Diabetic Foot Exam - detailed Diabetic Foot exam was performed with the following findings:  Yes 12/23/2018 10:43 AM  Visual Foot Exam completed.:  Yes  Pulse Foot Exam completed.:  Yes  Right Dorsalis Pedis:  Present Left Dorsalis Pedis:  Present  Sensory Foot Exam Completed.:  Yes Semmes-Weinstein Monofilament Test R Site 1-Great Toe:  Pos L Site 1-Great Toe:  Pos           Assessment & Plan:   Problem List Items Addressed This Visit      Endocrine   Diabetes mellitus with proliferative retinopathy ([place]) (Chronic)    We discussed possibility of food addiction and her obesity, as contributors to her diabetes; she will continue to see endocrinologist and ophthalmologist; foot exam by MD today; foot care encouraged        Musculoskeletal and Integument   Osteoporosis    Encouraged her to talk with her endocrinologist about treatment options, as endo usd to manage but she switched endocrinologists and it sounds like care for this may have just dropped off; fall precautions encouraged; adequate calcium and vit D        Other   Morbid obesity ([place]) (Chronic)    Talked with patient about possible food addiction, food insecurity from her childhood; suggested counseling; she will work on mindful eating, portion control      Hyperlipidemia (Chronic)    Patient has been taking only 30 mg of atorvastatin, and I showe dher that I sent in the 40 mg strength, was supposed to have it recheck on 40 mg; will see what numbers are today and adjust if needed          Follow up plan: Return in about 3 months (around 03/25/2019) for follow-up visit with Dr. Sanda Klein.  An after-visit summary was printed and given to the patient at [facility].  Please see the patient instructions which may contain other information and recommendations  beyond what is mentioned above in the assessment and plan.  No orders of the defined types were placed in this encounter.   No orders of the defined types were placed in this encounter.

## 2018-12-23 NOTE — Patient Instructions (Addendum)
Please do have your endocrinologist manage your A1c and check that Once we refer you to a specialist, we'll hope that your specialist will monitor that Please see him as soon as possible We'll ask him to manage your osteoporosis as well  Check out the information at familydoctor.org entitled "Nutrition for Weight Loss: What You Need to Know about Fad Diets" Try to lose between 1-2 pounds per week by taking in fewer calories and burning off more calories You can succeed by limiting portions, limiting foods dense in calories and fat, becoming more active, and drinking 8 glasses of water a day (64 ounces) Don't skip meals, especially breakfast, as skipping meals may alter your metabolism Do not use over-the-counter weight loss pills or gimmicks that claim rapid weight loss A healthy BMI (or body mass index) is between 18.5 and 24.9 You can calculate your ideal BMI at the [place] website ClubMonetize.fr  Do consider journalling, think about what you are eating and why you are eating  Try to limit saturated fats in your diet (bologna, hot dogs, barbeque, cheeseburgers, hamburgers, steak, bacon, sausage, cheese, etc.) and get more fresh fruits, vegetables, and whole grains   Obesity, Adult Obesity is the condition of having too much total body fat. Being overweight or obese means that your weight is greater than what is considered healthy for your body size. Obesity is determined by a measurement called BMI. BMI is an estimate of body fat and is calculated from height and weight. For adults, a BMI of 30 or higher is considered obese. Obesity can eventually lead to other health concerns and major illnesses, including:  Stroke.  Coronary artery disease (CAD).  Type 2 diabetes.  Some types of cancer, including cancers of the colon, breast, uterus, and gallbladder.  Osteoarthritis.  High blood pressure (hypertension).  High cholesterol.  Sleep  apnea.  Gallbladder stones.  Infertility problems. What are the causes? The main cause of obesity is taking in (consuming) more calories than your body uses for energy. Other factors that contribute to this condition may include:  Being born with genes that make you more likely to become obese.  Having a medical condition that causes obesity. These conditions include: ? Hypothyroidism. ? Polycystic ovarian syndrome (PCOS). ? Binge-eating disorder. ? Cushing syndrome.  Taking certain medicines, such as steroids, antidepressants, and seizure medicines.  Not being physically active (sedentary lifestyle).  Living where there are limited places to exercise safely or buy healthy foods.  Not getting enough sleep. What increases the risk? The following factors may increase your risk of this condition:  Having a family history of obesity.  Being a woman of African-American descent.  Being a man of Hispanic descent. What are the signs or symptoms? Having excessive body fat is the main symptom of this condition. How is this diagnosed? This condition may be diagnosed based on:  Your symptoms.  Your medical history.  A physical exam. Your health care provider may measure: ? Your BMI. If you are an adult with a BMI between 25 and less than 30, you are considered overweight. If you are an adult with a BMI of 30 or higher, you are considered obese. ? The distances around your hips and your waist (circumferences). These may be compared to each other to help diagnose your condition. ? Your skinfold thickness. Your health care provider may gently pinch a fold of your skin and measure it. How is this treated? Treatment for this condition often includes changing your lifestyle. Treatment may  include some or all of the following:  Dietary changes. Work with your health care provider and a dietitian to set a weight-loss goal that is healthy and reasonable for you. Dietary changes may include  eating: ? Smaller portions. A portion size is the amount of a particular food that is healthy for you to eat at one time. This varies from person to person. ? Low-calorie or low-fat options. ? More whole grains, fruits, and vegetables.  Regular physical activity. This may include aerobic activity (cardio) and strength training.  Medicine to help you lose weight. Your health care provider may prescribe medicine if you are unable to lose 1 pound a week after 6 weeks of eating more healthily and doing more physical activity.  Surgery. Surgical options may include gastric banding and gastric bypass. Surgery may be done if: ? Other treatments have not helped to improve your condition. ? You have a BMI of 40 or higher. ? You have life-threatening health problems related to obesity. Follow these instructions at home:  Eating and drinking   Follow recommendations from your health care provider about what you eat and drink. Your health care provider may advise you to: ? Limit fast foods, sweets, and processed snack foods. ? Choose low-fat options, such as low-fat milk instead of whole milk. ? Eat 5 or more servings of fruits or vegetables every day. ? Eat at home more often. This gives you more control over what you eat. ? Choose healthy foods when you eat out. ? Learn what a healthy portion size is. ? Keep low-fat snacks on hand. ? Avoid sugary drinks, such as soda, fruit juice, iced tea sweetened with sugar, and flavored milk. ? Eat a healthy breakfast.  Drink enough water to keep your urine clear or pale yellow.  Do not go without eating for long periods of time (do not fast) or follow a fad diet. Fasting and fad diets can be unhealthy and even dangerous. Physical Activity  Exercise regularly, as told by your health care provider. Ask your health care provider what types of exercise are safe for you and how often you should exercise.  Warm up and stretch before being active.  Cool  down and stretch after being active.  Rest between periods of activity. Lifestyle  Limit the time that you spend in front of your TV, computer, or video game system.  Find ways to reward yourself that do not involve food.  Limit alcohol intake to no more than 1 drink a day for nonpregnant women and 2 drinks a day for men. One drink equals 12 oz of beer, 5 oz of wine, or 1 oz of hard liquor. General instructions  Keep a weight loss journal to keep track of the food you eat and how much you exercise you get.  Take over-the-counter and prescription medicines only as told by your health care provider.  Take vitamins and supplements only as told by your health care provider.  Consider joining a support group. Your health care provider may be able to recommend a support group.  Keep all follow-up visits as told by your health care provider. This is important. Contact a health care provider if:  You are unable to meet your weight loss goal after 6 weeks of dietary and lifestyle changes. This information is not intended to replace advice given to you by your health care provider. Make sure you discuss any questions you have with your health care provider. Document Released: 11/08/2004 Document Revised: 03/05/2016  Document Reviewed: 07/20/2015 Elsevier Interactive Patient Education  2019 Elsevier Inc.  Preventing Unhealthy [employer], Adult Staying at a healthy weight is important to your overall health. When fat builds up in your body, you may become overweight or obese. Being overweight or obese increases your risk of developing certain health problems, such as heart disease, diabetes, sleeping problems, joint problems, and some types of cancer. Unhealthy weight gain is often the result of making unhealthy food choices or not getting enough exercise. You can make changes to your lifestyle to prevent obesity and stay as healthy as possible. What nutrition changes can be made?   Eat only  as much as your body needs. To do this: ? Pay attention to signs that you are hungry or full. Stop eating as soon as you feel full. ? If you feel hungry, try drinking water first before eating. Drink enough water so your urine is clear or pale yellow. ? Eat smaller portions. Pay attention to portion sizes when eating out. ? Look at serving sizes on food labels. Most foods contain more than one serving per container. ? Eat the recommended number of calories for your gender and activity level. For most active people, a daily total of 2,000 calories is appropriate. If you are trying to lose weight or are not very active, you may need to eat fewer calories. Talk with your health care provider or a diet and nutrition specialist (dietitian) about how many calories you need each day.  Choose healthy foods, such as: ? Fruits and vegetables. At each meal, try to fill at least half of your plate with fruits and vegetables. ? Whole grains, such as whole-wheat bread, brown rice, and quinoa. ? Lean meats, such as chicken or fish. ? Other healthy proteins, such as beans, eggs, or tofu. ? Healthy fats, such as nuts, seeds, fatty fish, and olive oil. ? Low-fat or fat-free dairy products.  Check food labels, and avoid food and drinks that: ? Are high in calories. ? Have added sugar. ? Are high in sodium. ? Have saturated fats or trans fats.  Cook foods in healthier ways, such as by baking, broiling, or grilling.  Make a meal plan for the week, and shop with a grocery list to help you stay on track with your purchases. Try to avoid going to the grocery store when you are hungry.  When grocery shopping, try to shop around the outside of the store first, where the fresh foods are. Doing this helps you to avoid prepackaged foods, which can be high in sugar, salt (sodium), and fat. What lifestyle changes can be made?   Exercise for 30 or more minutes on 5 or more days each week. Exercising may include brisk  walking, yard work, biking, running, swimming, and team sports like basketball and soccer. Ask your health care provider which exercises are safe for you.  Do muscle-strengthening activities, such as lifting weights or using resistance bands, on 2 or more days a week.  Do not use any products that contain nicotine or tobacco, such as cigarettes and e-cigarettes. If you need help quitting, ask your health care provider.  Limit alcohol intake to no more than 1 drink a day for nonpregnant women and 2 drinks a day for men. One drink equals 12 oz of beer, 5 oz of wine, or 1 oz of hard liquor.  Try to get 7-9 hours of sleep each night. What other changes can be made?  Keep a food and activity  journal to keep track of: ? What you ate and how many calories you had. Remember to count the calories in sauces, dressings, and side dishes. ? Whether you were active, and what exercises you did. ? Your calorie, weight, and activity goals.  Check your weight regularly. Track any changes. If you notice you have gained weight, make changes to your diet or activity routine.  Avoid taking weight-loss medicines or supplements. Talk to your health care provider before starting any new medicine or supplement.  Talk to your health care provider before trying any new diet or exercise plan. Why are these changes important? Eating healthy, staying active, and having healthy habits can help you to prevent obesity. Those changes also:  Help you manage stress and emotions.  Help you connect with friends and family.  Improve your self-esteem.  Improve your sleep.  Prevent long-term health problems. What can happen if changes are not made? Being obese or overweight can cause you to develop joint or bone problems, which can make it hard for you to stay active or do activities you enjoy. Being obese or overweight also puts stress on your heart and lungs and can lead to health problems like diabetes, heart disease,  and some cancers. Where to find more information Talk with your health care provider or a dietitian about healthy eating and healthy lifestyle choices. You may also find information from:  U.S. Department of Agriculture, MyPlate: FormerBoss.no  American Heart Association: www.heart.org  Centers for Disease Control and Prevention: http://www.wolf.info/ Summary  Staying at a healthy weight is important to your overall health. It helps you to prevent certain diseases and health problems, such as heart disease, diabetes, joint problems, sleep disorders, and some types of cancer.  Being obese or overweight can cause you to develop joint or bone problems, which can make it hard for you to stay active or do activities you enjoy.  You can prevent unhealthy weight gain by eating a healthy diet, exercising regularly, not smoking, limiting alcohol, and getting enough sleep.  Talk with your health care provider or a dietitian for guidance about healthy eating and healthy lifestyle choices. This information is not intended to replace advice given to you by your health care provider. Make sure you discuss any questions you have with your health care provider. Document Released: 10/02/2016 Document Revised: 07/12/2017 Document Reviewed: 11/07/2016 Elsevier Interactive Patient Education  2019 [employer].

## 2018-12-26 ENCOUNTER — Telehealth: Payer: Self-pay

## 2018-12-26 NOTE — Telephone Encounter (Signed)
Dr. Sherryl Manges office called back states pt has a follow-up with them scheduled

## 2018-12-28 NOTE — Assessment & Plan Note (Signed)
Patient has been taking only 30 mg of atorvastatin, and I showe dher that I sent in the 40 mg strength, was supposed to have it recheck on 40 mg; will see what numbers are today and adjust if needed

## 2018-12-28 NOTE — Assessment & Plan Note (Signed)
Encouraged her to talk with her endocrinologist about treatment options, as endo usd to manage but she switched endocrinologists and it sounds like care for this may have just dropped off; fall precautions encouraged; adequate calcium and vit D

## 2018-12-28 NOTE — Assessment & Plan Note (Signed)
We discussed possibility of food addiction and her obesity, as contributors to her diabetes; she will continue to see endocrinologist and ophthalmologist; foot exam by MD today; foot care encouraged

## 2018-12-28 NOTE — Assessment & Plan Note (Signed)
Talked with patient about possible food addiction, food insecurity from her childhood; suggested counseling; she will work on mindful eating, portion control

## 2018-12-29 ENCOUNTER — Telehealth: Payer: Self-pay

## 2018-12-29 ENCOUNTER — Other Ambulatory Visit: Payer: Self-pay

## 2018-12-29 MED ORDER — ATORVASTATIN CALCIUM 40 MG PO TABS: 40.0000 mg | ORAL_TABLET | Freq: Every day | ORAL | 1 refills | Status: DC

## 2018-12-29 NOTE — Telephone Encounter (Signed)
Rx sent 

## 2018-12-29 NOTE — Telephone Encounter (Signed)
Copied from [place] [id number]. Topic: General - Other >> Dec 29, 2018  1:52 PM Leward Quan A wrote: Reason for CRM: Patient called to say that she would like to start taking the 40mg  of atorvastatin (LIPITOR) 40 MG tablet and said she got the 30 days from local pharmacy. She is asking for Rx to be sent to [place], [place] [phone] (Phone) [phone] (Fax) for 90 day supply.

## 2019-01-01 ENCOUNTER — Encounter: Payer: Self-pay | Admitting: Family Medicine

## 2019-01-16 DIAGNOSIS — M810 Age-related osteoporosis without current pathological fracture: Secondary | ICD-10-CM | POA: Diagnosis not present

## 2019-01-16 DIAGNOSIS — E1129 Type 2 diabetes mellitus with other diabetic kidney complication: Secondary | ICD-10-CM | POA: Diagnosis not present

## 2019-01-16 DIAGNOSIS — E1169 Type 2 diabetes mellitus with other specified complication: Secondary | ICD-10-CM | POA: Diagnosis not present

## 2019-01-16 DIAGNOSIS — R809 Proteinuria, unspecified: Secondary | ICD-10-CM | POA: Diagnosis not present

## 2019-01-16 DIAGNOSIS — E1159 Type 2 diabetes mellitus with other circulatory complications: Secondary | ICD-10-CM | POA: Diagnosis not present

## 2019-01-21 DIAGNOSIS — E113212 Type 2 diabetes mellitus with mild nonproliferative diabetic retinopathy with macular edema, left eye: Secondary | ICD-10-CM | POA: Diagnosis not present

## 2019-01-28 DIAGNOSIS — E113212 Type 2 diabetes mellitus with mild nonproliferative diabetic retinopathy with macular edema, left eye: Secondary | ICD-10-CM | POA: Diagnosis not present

## 2019-02-08 ENCOUNTER — Other Ambulatory Visit: Payer: Self-pay | Admitting: Family Medicine

## 2019-02-25 DIAGNOSIS — E113212 Type 2 diabetes mellitus with mild nonproliferative diabetic retinopathy with macular edema, left eye: Secondary | ICD-10-CM | POA: Diagnosis not present

## 2019-03-18 ENCOUNTER — Telehealth: Payer: Self-pay | Admitting: Family Medicine

## 2019-03-18 ENCOUNTER — Ambulatory Visit: Payer: Self-pay | Admitting: Family Medicine

## 2019-03-18 DIAGNOSIS — E113311 Type 2 diabetes mellitus with moderate nonproliferative diabetic retinopathy with macular edema, right eye: Secondary | ICD-10-CM | POA: Diagnosis not present

## 2019-03-18 NOTE — Telephone Encounter (Signed)
@  [facility]   Outreach Note  03/18/2019 Name: Julie [name] MRN: [id number] DOB: 1946-07-17  Referred by: Arnetha Courser, MD Reason for referral : Chronic Care Management (Initial CCM outreach call was unsuccessful)   An unsuccessful telephone outreach was attempted today. The patient was referred to the case management team by for assistance with chronic care management and care coordination.   Follow Up Plan: A HIPPA compliant phone message was left for the patient providing contact information and requesting a return call.  The care management team will reach out to the patient again over the next 7 days.  If patient returns call to provider office, please advise to call [facility] at [place]  ??[email or web address]   ??[phone]

## 2019-03-18 NOTE — Chronic Care Management (AMB) (Signed)
Chronic Care Management   Note  03/18/2019 Name: Julie [name] MRN: [id number] DOB: 01/31/1946  Julie [name] is a 73 y.o. year old female who is a primary care patient of Lada, Satira Anis, MD. I reached out to Harrison Mons by phone today in response to a referral sent by Julie [name] health plan.    Julie [name] was given information about Chronic Care Management services today including:  1. CCM service includes personalized support from designated clinical staff supervised by her physician, including individualized plan of care and coordination with other care providers 2. 24/7 contact phone numbers for assistance for urgent and routine care needs. 3. Service will only be billed when office clinical staff spend 20 minutes or more in a month to coordinate care. 4. Only one practitioner may furnish and bill the service in a calendar month. 5. The patient may stop CCM services at any time (effective at the end of the month) by phone call to the office staff. 6. The patient will be responsible for cost sharing (co-pay) of up to 20% of the service fee (after annual deductible is met).  Patient did not agree to enrollment in care management services and does not wish to consider at this time.  Follow up plan: The patient has been provided with contact information for the chronic care management team and has been advised to call with any health related questions or concerns.   [employer]  [email or web address]   ??[phone]

## 2019-03-25 ENCOUNTER — Ambulatory Visit: Payer: Medicare Other | Admitting: Family Medicine

## 2019-03-30 DIAGNOSIS — E113312 Type 2 diabetes mellitus with moderate nonproliferative diabetic retinopathy with macular edema, left eye: Secondary | ICD-10-CM | POA: Diagnosis not present

## 2019-04-14 ENCOUNTER — Other Ambulatory Visit: Payer: Self-pay | Admitting: Nurse Practitioner

## 2019-04-14 NOTE — Telephone Encounter (Signed)
Please schedule routine follow-up on or around her MAW in 06/2019

## 2019-04-14 NOTE — Telephone Encounter (Signed)
Lft message to schedule MWV and medication has been approved

## 2019-05-04 DIAGNOSIS — E113212 Type 2 diabetes mellitus with mild nonproliferative diabetic retinopathy with macular edema, left eye: Secondary | ICD-10-CM | POA: Diagnosis not present

## 2019-05-13 DIAGNOSIS — E113311 Type 2 diabetes mellitus with moderate nonproliferative diabetic retinopathy with macular edema, right eye: Secondary | ICD-10-CM | POA: Diagnosis not present

## 2019-05-27 DIAGNOSIS — E1169 Type 2 diabetes mellitus with other specified complication: Secondary | ICD-10-CM | POA: Diagnosis not present

## 2019-05-27 DIAGNOSIS — E1159 Type 2 diabetes mellitus with other circulatory complications: Secondary | ICD-10-CM | POA: Diagnosis not present

## 2019-05-27 DIAGNOSIS — E1129 Type 2 diabetes mellitus with other diabetic kidney complication: Secondary | ICD-10-CM | POA: Diagnosis not present

## 2019-05-27 DIAGNOSIS — E785 Hyperlipidemia, unspecified: Secondary | ICD-10-CM | POA: Diagnosis not present

## 2019-05-27 DIAGNOSIS — R809 Proteinuria, unspecified: Secondary | ICD-10-CM | POA: Diagnosis not present

## 2019-06-03 ENCOUNTER — Other Ambulatory Visit: Payer: Self-pay

## 2019-06-03 MED ORDER — ATORVASTATIN CALCIUM 40 MG PO TABS: 40.0000 mg | ORAL_TABLET | Freq: Every day | ORAL | 1 refills | Status: DC

## 2019-06-03 MED ORDER — PIOGLITAZONE HCL 15 MG PO TABS: 15.0000 mg | ORAL_TABLET | Freq: Every day | ORAL | 0 refills | Status: DC

## 2019-06-03 NOTE — Telephone Encounter (Signed)
Patient is overdue for routine appoitnment please set up for next available with labs

## 2019-06-04 NOTE — Telephone Encounter (Signed)
Spoke with pt and she will call back to schedule the appt she has to check her calander

## 2019-06-05 NOTE — Telephone Encounter (Signed)
Pt prefers to wait until Sept to be seen.  Pt has ben scheduled.

## 2019-06-08 DIAGNOSIS — I10 Essential (primary) hypertension: Secondary | ICD-10-CM | POA: Diagnosis not present

## 2019-06-08 DIAGNOSIS — N183 Chronic kidney disease, stage 3 (moderate): Secondary | ICD-10-CM | POA: Diagnosis not present

## 2019-06-08 DIAGNOSIS — E113212 Type 2 diabetes mellitus with mild nonproliferative diabetic retinopathy with macular edema, left eye: Secondary | ICD-10-CM | POA: Diagnosis not present

## 2019-06-08 DIAGNOSIS — R809 Proteinuria, unspecified: Secondary | ICD-10-CM | POA: Diagnosis not present

## 2019-06-08 DIAGNOSIS — E1122 Type 2 diabetes mellitus with diabetic chronic kidney disease: Secondary | ICD-10-CM | POA: Diagnosis not present

## 2019-06-15 ENCOUNTER — Ambulatory Visit: Payer: Self-pay | Admitting: *Deleted

## 2019-06-15 ENCOUNTER — Emergency Department: Payer: Medicare Other

## 2019-06-15 ENCOUNTER — Other Ambulatory Visit: Payer: Self-pay

## 2019-06-15 ENCOUNTER — Emergency Department
Admission: EM | Admit: 2019-06-15 | Discharge: 2019-06-15 | Discharge status: Against Medical Advice | Payer: Medicare Other | Attending: Emergency Medicine | Admitting: Emergency Medicine

## 2019-06-15 DIAGNOSIS — Z5321 Procedure and treatment not carried out due to patient leaving prior to being seen by health care provider: Secondary | ICD-10-CM | POA: Diagnosis not present

## 2019-06-15 DIAGNOSIS — R2242 Localized swelling, mass and lump, left lower limb: Secondary | ICD-10-CM | POA: Diagnosis present

## 2019-06-15 DIAGNOSIS — R0602 Shortness of breath: Secondary | ICD-10-CM | POA: Diagnosis not present

## 2019-06-15 DIAGNOSIS — M79605 Pain in left leg: Secondary | ICD-10-CM | POA: Diagnosis not present

## 2019-06-15 DIAGNOSIS — R600 Localized edema: Secondary | ICD-10-CM | POA: Diagnosis not present

## 2019-06-15 NOTE — ED Notes (Signed)
Pt ambulatory to STAT stating that she needs to leave now, unable to wait any longer despite encouragement to stay and be evaluated; instr to return for any new or worsening symptoms

## 2019-06-15 NOTE — Telephone Encounter (Signed)
Pt reports swelling of both legs. States "Always have some swelling but has worsened past 3 weeks at least. " States noted Friday "Hardened area with a ridge about 3-4 inches above ankle, outer aspect of leg."  States hard to assess is reddned s legs are "Always discolored." States both legs "Sore." Notes hardened are "A little tender to touch." States elevated legs yesterday which helped. Also reports noted increased swelling of legs when she started Actos, endocrinologist aware. Denies any SOB. Pt states reluctant to go UC/ED as "I'm afraid of the virus." Asking if she can be seen today; call transferred to practice. No availability. Reiterated need for UC/ED visit to R/O DVT. Pt verbalizes understanding. States will follow disposition.  Reason for Disposition . [1] Thigh or calf pain AND [2] only 1 side AND [3] present > 1 hour  Answer Assessment - Initial Assessment Questions 1. ONSET: "When did the swelling start?" (e.g., minutes, hours, days)    At least 3 weeks ago 2. LOCATION: "What part of the leg is swollen?"  "Are both legs swollen or just one leg?"     Both legs but left greater than right since Friday 3. SEVERITY: "How bad is the swelling?" (e.g., localized; mild, moderate, severe)  - Localized - small area of swelling localized to one leg  - MILD pedal edema - swelling limited to foot and ankle, pitting edema < 1/4 inch (6 mm) deep, rest and elevation eliminate most or all swelling  - MODERATE edema - swelling of lower leg to knee, pitting edema > 1/4 inch (6 mm) deep, rest and elevation only partially reduce swelling  - SEVERE edema - swelling extends above knee, facial or hand swelling present      Soreness, tightness,3/10 4. REDNESS: "Does the swelling look red or infected?"     Legs always discolored 5. PAIN: "Is the swelling painful to touch?" If so, ask: "How painful is it?"   (Scale 1-10; mild, moderate or severe)    Yes 3.10 6. FEVER: "Do you have a fever?" If so, ask: "What  is it, how was it measured, and when did it start?"      no 7. CAUSE: "What do you think is causing the leg swelling?"     Actos but not sure 8. MEDICAL HISTORY: "Do you have a history of heart failure, kidney disease, liver failure, or cancer?"     9. RECURRENT SYMPTOM: "Have you had leg swelling before?" If so, ask: "When was the last time?" "What happened that time?"    Yes since starting actos 10. OTHER SYMPTOMS: "Do you have any other symptoms?" (e.g., chest pain, difficulty breathing)     H/O SOB, no worse  Protocols used: LEG SWELLING AND EDEMA-A-AH

## 2019-06-15 NOTE — ED Triage Notes (Signed)
Pt here with left leg swelling and calf tenderness for months. Pt states soreness has been present since Friday. Pt states her doctor recommended an ultrasound. Pt denies shob, fever. Slight redness noted to calf.

## 2019-06-16 NOTE — Telephone Encounter (Signed)
Patient scheduled.

## 2019-06-17 ENCOUNTER — Ambulatory Visit (INDEPENDENT_AMBULATORY_CARE_PROVIDER_SITE_OTHER): Payer: Medicare Other | Admitting: Family Medicine

## 2019-06-17 ENCOUNTER — Encounter: Payer: Self-pay | Admitting: Family Medicine

## 2019-06-17 ENCOUNTER — Other Ambulatory Visit: Payer: Self-pay

## 2019-06-17 VITALS — BP 146/58 | HR 82 | Temp 97.1°F | Resp 14 | Ht 62.0 in | Wt 293.7 lb

## 2019-06-17 DIAGNOSIS — R600 Localized edema: Secondary | ICD-10-CM

## 2019-06-17 DIAGNOSIS — R0609 Other forms of dyspnea: Secondary | ICD-10-CM

## 2019-06-17 MED ORDER — FUROSEMIDE 20 MG PO TABS: 20.0000 mg | ORAL_TABLET | Freq: Every day | ORAL | 0 refills | Status: DC | PRN

## 2019-06-17 NOTE — Progress Notes (Signed)
Did o2 walk test, o2 started at 96% walked 2 laps around the office and o2 dropped to 91%

## 2019-06-17 NOTE — Patient Instructions (Addendum)
Elevate your legs, use an ace wrap to help give some compression to your lower legs  When able to - put on compression stockings 20-30 mmHg  Take the lasix's only for one to three days if needed for severe leg swelling - on those days you need to hold the HCTZ  Monitor your blood pressure and your weights  Call us right away if your BP is higher than 160/100  Call us if you have sudden weight changes more than 5 lbs in a few days   Avoid salt in diet.  Peripheral Edema  Peripheral edema is swelling that is caused by a buildup of fluid. Peripheral edema most often affects the lower legs, ankles, and feet. It can also develop in the arms, hands, and face. The area of the body that has peripheral edema will look swollen. It may also feel heavy or warm. Your clothes may start to feel tight. Pressing on the area may make a temporary dent in your skin. You may not be able to move your swollen arm or leg as much as usual. There are many causes of peripheral edema. It can happen because of a complication of other conditions such as congestive heart failure, kidney disease, or a problem with your blood circulation. It also can be a side effect of certain medicines or because of an infection. It often happens to women during pregnancy. Sometimes, the cause is not known. Follow these instructions at home: Managing pain, stiffness, and swelling   Raise (elevate) your legs while you are sitting or lying down.  Move around often to prevent stiffness and to lessen swelling.  Do not sit or stand for long periods of time.  Wear support stockings as told by your health care provider. Medicines  Take over-the-counter and prescription medicines only as told by your health care provider.  Your health care provider may prescribe medicine to help your body get rid of excess water (diuretic). General instructions  Pay attention to any changes in your symptoms.  Follow instructions from your health care  provider about limiting salt (sodium) in your diet. Sometimes, eating less salt may reduce swelling.  Moisturize skin daily to help prevent skin from cracking and draining.  Keep all follow-up visits as told by your health care provider. This is important. Contact a health care provider if you have:  A fever.  Edema that starts suddenly or is getting worse, especially if you are pregnant or have a medical condition.  Swelling in only one leg.  Increased swelling, redness, or pain in one or both of your legs.  Drainage or sores at the area where you have edema. Get help right away if you:  Develop shortness of breath, especially when you are lying down.  Have pain in your chest or abdomen.  Feel weak.  Feel faint. Summary  Peripheral edema is swelling that is caused by a buildup of fluid. Peripheral edema most often affects the lower legs, ankles, and feet.  Move around often to prevent stiffness and to lessen swelling. Do not sit or stand for long periods of time.  Pay attention to any changes in your symptoms.  Contact a health care provider if you have edema that starts suddenly or is getting worse, especially if you are pregnant or have a medical condition.  Get help right away if you develop shortness of breath, especially when lying down. This information is not intended to replace advice given to you by your health care provider.  Make sure you discuss any questions you have with your health care provider. Document Released: 11/08/2004 Document Revised: 06/25/2018 Document Reviewed: 06/25/2018 Elsevier Patient Education  2020 [employer].

## 2019-06-17 NOTE — Progress Notes (Signed)
Patient ID: Julie [name], female    DOB: Jul 28, 1946, 73 y.o.   MRN: [id number]  PCP: Delsa Grana, PA-C  Chief Complaint  Patient presents with  . Follow-up    ER- everything checked out normal  . Leg Swelling    Subjective:   Julie [name] is a 73 y.o. female, presents to clinic with CC of the following:  Bilateral LE edema with worsening swelling and pain over the past 6 days.  She called on Monday (2 days ago) and there were no appointments available and she was directed to the ER to rule out a DVT.  She did wait in the ER for 12 hours she states, DVT study was done but she left the ER prior to evaluation by a provider and she had no blood work done.  She was feeling lightheaded jittery felt like her blood sugar was low and she could not wait any longer to eat or drink.  DVT study was negative.  Patient states she has history of some exertional SOB, with long distances walking, like when having to go to [place] or when she is working harder on her house, but she is not sure if DOE has been worse with the worse edema, she has not done as much as she normally does.  In the past she has had her legs swell up this has been more related to eating a lot of salt.  She has had this come and go many times before.  Usually she can elevate her legs and it improves.  She did elevate them a lot over the weekend and the pain helped a little bit but on Monday when she was unable to continue to elevate her legs swelling and pain were worse.  She also notes that her lower extremity edema has been worse since she was put back on Actos by her endocrinologist.  She denies orthopnea, PND, near syncope, fatigue, CP, palpitations. When asked if she has gained weight suddenly she explains that her weight up overall this this year, about 10 lbs due to [place], but she believes weight is up a few lbs in the last week. Her BP readings in ER and in office today she says are not typical for her, but in the ER she  was very agitated and today blood pressures little elevated. Pt has not tried compression stockings. She has not seen a vascular specialist I reviewed the chart and do not see any past [facility], pt states she is established with cardiology. To patient's bilateral ankle she has some hyperpigmented areas which she says is been there since she was a child and she has been given several different reasons from multiple dermatologist.  Her skin is currently tight with a little bit of redness but no rash wounds or weeping of fluid BP Readings from Last 3 Encounters:  06/17/19 (!) 146/58  06/15/19 (!) 175/88  12/23/18 136/68   Wt Readings from Last 5 Encounters:  06/17/19 293 lb 11.2 oz (133.2 kg)  06/15/19 294 lb (133.4 kg)  12/23/18 283 lb (128.4 kg)  09/23/18 278 lb 3.2 oz (126.2 kg)  07/03/18 282 lb 3 oz (128 kg)    Patient is a very difficult historian but she does say that in the past week she saw her nephrologist and her endocrinologist Saw Lateef about one week ago, she says she got a call this morning with lab results that they were going to send to [facility]. She also  just saw endocrinology about 2 to 3 weeks ago and had lab work done, this is available through care everywhere she did have chemistry, A1c and lipid panel done Results were reviewed through care everywhere.    Patient Active Problem List   Diagnosis Date Noted  . Stool guaiac positive 06/03/2017  . Dermatitis 01/04/2017  . Low back pain 06/06/2016  . Medication monitoring encounter 11/30/2015  . Varicose vein of leg 11/30/2015  . Abnormal mammogram 08/04/2015  . Cardiac murmur 07/28/2015  . Hypertension   . Hyperlipidemia   . Diabetes mellitus with proliferative retinopathy ([place])   . CKD (chronic kidney disease) stage 2, GFR 60-89 ml/min   . Morbid obesity ([place])   . Osteoporosis   . Proteinuria 10/15/2013      Current Outpatient Medications:  .  alendronate (FOSAMAX) 70 MG tablet, Take 70  mg by mouth once a week. , Disp: , Rfl:  .  amLODipine (NORVASC) 10 MG tablet, TAKE 1 TABLET BY MOUTH  DAILY, Disp: 90 tablet, Rfl: 1 .  aspirin 81 MG tablet, Take 81 mg by mouth daily., Disp: , Rfl:  .  atorvastatin (LIPITOR) 40 MG tablet, Take 1 tablet (40 mg total) by mouth at bedtime., Disp: 90 tablet, Rfl: 1 .  bevacizumab (AVASTIN) 1.25 mg/0.1 mL SOLN, 10 mg by Intravitreal route to Surgery., Disp: , Rfl:  .  Cholecalciferol (VITAMIN D-1000 MAX ST) 1000 UNITS tablet, Take 1,000 Units by mouth daily., Disp: , Rfl:  .  glipiZIDE (GLUCOTROL XL) 10 MG 24 hr tablet, TAKE 1 TABLET BY MOUTH  TWICE A DAY, Disp: 180 tablet, Rfl: 1 .  hydrochlorothiazide (HYDRODIURIL) 25 MG tablet, TAKE 1 TABLET BY MOUTH  DAILY, Disp: 90 tablet, Rfl: 1 .  lisinopril (ZESTRIL) 40 MG tablet, TAKE 1 TABLET BY MOUTH  DAILY, Disp: 90 tablet, Rfl: 1 .  metFORMIN (GLUCOPHAGE) 500 MG tablet, TAKE 2 TABLETS BY MOUTH TWO TIMES DAILY, Disp: 360 tablet, Rfl: 1 .  Multiple Vitamin (MULTI-VITAMINS) TABS, Take by mouth daily. , Disp: , Rfl:  .  pioglitazone (ACTOS) 15 MG tablet, Take 1 tablet (15 mg total) by mouth daily., Disp: 90 tablet, Rfl: 0 .  Omega-3 Fatty Acids (FISH OIL) 1000 MG CAPS, Take 1,000 mg by mouth 2 (two) times daily., Disp: , Rfl:   Current Facility-Administered Medications:  .  triamcinolone acetonide (KENALOG) 10 MG/ML injection 10 mg, 10 mg, Other, Once, [place]al, Julie [name], DPM   Allergies  Allergen Reactions  . Nsaids Other (See Comments)  . Penicillins Other (See Comments)    Not sure of reaction      Family History  Problem Relation Age of Onset  . Glaucoma Mother   . Cancer Father        liver  . Cancer Sister        lung, bone  . COPD Sister   . Heart disease Maternal Grandmother        chf  . Hypertension Brother   . Hypertension Maternal Uncle   . Ovarian cancer Paternal Grandmother   . Cancer Paternal Grandmother        ovarian cancer  . Breast cancer Neg Hx      Social History    Socioeconomic History  . Marital status: Married    Spouse name: Julie [name]  . Number of children: 1  . Years of education: some college  . Highest education level: 12th grade  Occupational History  . Occupation: Retired  Scientific laboratory technician  . Financial  resource strain: Very hard  . Food insecurity    Worry: Never true    Inability: Never true  . Transportation needs    Medical: No    Non-medical: No  Tobacco Use  . Smoking status: Never Smoker  . Smokeless tobacco: Never Used  . Tobacco comment: smoking cessation materials not required  Substance and Sexual Activity  . Alcohol use: No  . Drug use: No  . Sexual activity: Not Currently  Lifestyle  . Physical activity    Days per week: 0 days    Minutes per session: 0 min  . Stress: Not at all  Relationships  . Social Herbalist on phone: Patient refused    Gets together: Patient refused    Attends religious service: Patient refused    Active member of club or organization: Patient refused    Attends meetings of clubs or organizations: Patient refused    Relationship status: Married  . Intimate partner violence    Fear of current or ex partner: No    Emotionally abused: No    Physically abused: No    Forced sexual activity: No  Other Topics Concern  . Not on file  Social History Narrative  . Not on file    I personally reviewed active problem list, medication list, allergies, family history, social history, notes from last encounter, lab results with the patient/caregiver today.  Review of Systems  Constitutional: Negative.  Negative for activity change, appetite change and fatigue.  HENT: Negative.   Eyes: Negative.   Respiratory: Positive for shortness of breath. Negative for cough, chest tightness and wheezing.   Cardiovascular: Positive for leg swelling. Negative for chest pain and palpitations.  Gastrointestinal: Negative.   Endocrine: Negative.  Negative for cold intolerance, heat intolerance,  polydipsia, polyphagia and polyuria.  Genitourinary: Negative.   Musculoskeletal: Negative.   Skin: Negative.  Negative for color change and wound.  Allergic/Immunologic: Negative.   Neurological: Negative.  Negative for dizziness, tremors, syncope, weakness, light-headedness and headaches.  Hematological: Negative.   Psychiatric/Behavioral: Negative.   All other systems reviewed and are negative.      Objective:   Vitals:   06/17/19 1100  BP: (!) 146/58  Pulse: 82  Resp: 14  Temp: (!) 97.1 F (36.2 C)  SpO2: 94%  Weight: 293 lb 11.2 oz (133.2 kg)  Height: 5\' 2"  (1.575 m)    Body mass index is 53.72 kg/m.  Physical Exam Vitals signs and nursing note reviewed.  Constitutional:      Appearance: She is well-developed. She is obese.  HENT:     Head: Normocephalic and atraumatic.     Nose: Nose normal.  Eyes:     General:        Right eye: No discharge.        Left eye: No discharge.     Conjunctiva/sclera: Conjunctivae normal.  Neck:     Trachea: No tracheal deviation.  Cardiovascular:     Rate and Rhythm: Regular rhythm. Tachycardia present.  No extrasystoles are present.    Chest Wall: PMI is not displaced.     Pulses: Normal pulses.          Radial pulses are 2+ on the right side and 2+ on the left side.       Dorsalis pedis pulses are 2+ on the right side and 2+ on the left side.     Heart sounds: Normal heart sounds. No murmur. No friction rub. No gallop.  Comments: B/l pedal, ankle and pretibial pitting edema Pulmonary:     Effort: Pulmonary effort is normal. Tachypnea present. No prolonged expiration, respiratory distress or retractions.     Breath sounds: Normal breath sounds. No stridor or decreased air movement. No decreased breath sounds, wheezing, rhonchi or rales.  Musculoskeletal: Normal range of motion.     Right lower leg: 2+ Edema present.     Left lower leg: 2+ Edema present.  Skin:    General: Skin is warm and dry.     Coloration: Skin is  not ashen, cyanotic, mottled or pale.     Findings: No rash or wound.     Nails: There is no clubbing.      Comments: Bilateral medial ankle hyperpigmentation  Neurological:     Mental Status: She is alert.     Motor: No abnormal muscle tone.     Coordination: Coordination normal.  Psychiatric:        Behavior: Behavior normal.       Ambulatory pulse ox done at beginning of OV - RA prior to walking 96%, after two laps Spo2 on RA 91%     Assessment & Plan:      ICD-10-CM   1. Bilateral lower extremity edema  R60.0 Brain natriuretic peptide    COMPLETE METABOLIC PANEL WITH GFR    furosemide (LASIX) 20 MG tablet   r/o CHF, looks like she has peripheral veinous insufficiency, also could be med SE with actos and norvasc  2. DOE (dyspnea on exertion)  R06.09 Brain natriuretic peptide   has some exertional dyspnea, pulse ox declined a little with ambulation here, pt denies any sudden breathing changes, check BNP ?f/up cardiology    Patient presents for follow-up after going to the ER for lower extremity edema she had a negative DVT study but left before assessment her labs.  She has acute on chronic bilateral pretibial pitting edema with some mild reactive dermatitis, no wounds but some skin changes suggestive of venous insufficiency -with ankle hyperpigmentation although she states that is been there since she was a child. Her history is not overly concerning for CHF but she does have some exertional dyspnea, so a BNP along with chemistry was obtained today to check her fluid overload.  Because of the ambulatory pulse ox test patient had some mild tachypnea and tachycardia during her physical exam but she had no distress no hypoxia no signs on pulmonary exam for pulmonary edema.  She also has Norvasc and Actos which may be causing worsening lower extremity edema?  Because her blood pressure was elevated today and with her recent ER visit will not change Norvasc and patient is going to  follow-up with her endocrinologist about Actos  We will have her hold HCTZ and try 1 to 2 days dose of Lasix to get some fluid off, has been instructed to elevate and get compression stockings.    Depending on lab work may have her follow-up with her cardiologist, and may possibly refer to vascular for further work-up of venous insufficiency versus dependent edema but she was instructed to do all of the lifestyle changes, DASH diet, compression stockings as that will still be part of their initial instructions.      Delsa Grana, PA-C 06/17/19 11:18 AM

## 2019-06-18 ENCOUNTER — Encounter: Payer: Self-pay | Admitting: Family Medicine

## 2019-06-18 LAB — BRAIN NATRIURETIC PEPTIDE: Brain Natriuretic Peptide: 38 pg/mL (ref <100)

## 2019-06-18 LAB — COMPLETE METABOLIC PANEL WITH GFR
AG Ratio: 1.7 (calc) (ref 1.0–2.5)
ALT: 14 U/L (ref 6–29)
AST: 17 U/L (ref 10–35)
Albumin: 3.8 g/dL (ref 3.6–5.1)
Alkaline phosphatase (APISO): 71 U/L (ref 37–153)
BUN/Creatinine Ratio: 22 (calc) (ref 6–22)
BUN: 26 mg/dL — ABNORMAL HIGH (ref 7–25)
CO2: 28 mmol/L (ref 20–32)
Calcium: 9.5 mg/dL (ref 8.6–10.4)
Chloride: 106 mmol/L (ref 98–110)
Creat: 1.18 mg/dL — ABNORMAL HIGH (ref 0.60–0.93)
GFR, Est African American: 54 mL/min/{1.73_m2} — ABNORMAL LOW (ref >=60)
GFR, Est Non African American: 46 mL/min/{1.73_m2} — ABNORMAL LOW (ref >=60)
Globulin: 2.2 g/dL (calc) (ref 1.9–3.7)
Glucose, Bld: 148 mg/dL — ABNORMAL HIGH (ref 65–99)
Potassium: 4.3 mmol/L (ref 3.5–5.3)
Sodium: 142 mmol/L (ref 135–146)
Total Bilirubin: 0.4 mg/dL (ref 0.2–1.2)
Total Protein: 6 g/dL — ABNORMAL LOW (ref 6.1–8.1)

## 2019-06-23 ENCOUNTER — Ambulatory Visit: Payer: Medicare Other

## 2019-06-26 ENCOUNTER — Ambulatory Visit: Payer: Medicare Other

## 2019-07-08 ENCOUNTER — Ambulatory Visit (INDEPENDENT_AMBULATORY_CARE_PROVIDER_SITE_OTHER): Payer: Medicare Other | Admitting: Family Medicine

## 2019-07-08 ENCOUNTER — Other Ambulatory Visit: Payer: Self-pay

## 2019-07-08 ENCOUNTER — Encounter: Payer: Self-pay | Admitting: Family Medicine

## 2019-07-08 VITALS — BP 142/62 | HR 86 | Temp 98.1°F | Resp 14 | Ht 62.0 in | Wt 285.9 lb

## 2019-07-08 DIAGNOSIS — I10 Essential (primary) hypertension: Secondary | ICD-10-CM

## 2019-07-08 DIAGNOSIS — E113311 Type 2 diabetes mellitus with moderate nonproliferative diabetic retinopathy with macular edema, right eye: Secondary | ICD-10-CM | POA: Diagnosis not present

## 2019-07-08 DIAGNOSIS — E782 Mixed hyperlipidemia: Secondary | ICD-10-CM

## 2019-07-08 DIAGNOSIS — R600 Localized edema: Secondary | ICD-10-CM | POA: Insufficient documentation

## 2019-07-08 DIAGNOSIS — E113513 Type 2 diabetes mellitus with proliferative diabetic retinopathy with macular edema, bilateral: Secondary | ICD-10-CM

## 2019-07-08 DIAGNOSIS — Z23 Encounter for immunization: Secondary | ICD-10-CM

## 2019-07-08 DIAGNOSIS — S91331A Puncture wound without foreign body, right foot, initial encounter: Secondary | ICD-10-CM | POA: Diagnosis not present

## 2019-07-08 DIAGNOSIS — N182 Chronic kidney disease, stage 2 (mild): Secondary | ICD-10-CM | POA: Diagnosis not present

## 2019-07-08 LAB — CBC WITH DIFFERENTIAL/PLATELET
Absolute Monocytes: 511 cells/uL (ref 200–950)
Basophils Absolute: 41 cells/uL (ref 0–200)
Basophils Relative: 0.6 %
Eosinophils Absolute: 290 cells/uL (ref 15–500)
Eosinophils Relative: 4.2 %
HCT: 36.3 % (ref 35.0–45.0)
Hemoglobin: 11.8 g/dL (ref 11.7–15.5)
Lymphs Abs: 1270 cells/uL (ref 850–3900)
MCH: 26.2 pg — ABNORMAL LOW (ref 27.0–33.0)
MCHC: 32.5 g/dL (ref 32.0–36.0)
MCV: 80.5 fL (ref 80.0–100.0)
MPV: 11.9 fL (ref 7.5–12.5)
Monocytes Relative: 7.4 %
Neutro Abs: 4789 cells/uL (ref 1500–7800)
Neutrophils Relative %: 69.4 %
Platelets: 194 10*3/uL (ref 140–400)
RBC: 4.51 10*6/uL (ref 3.80–5.10)
RDW: 15.9 % — ABNORMAL HIGH (ref 11.0–15.0)
Total Lymphocyte: 18.4 %
WBC: 6.9 10*3/uL (ref 3.8–10.8)

## 2019-07-08 LAB — BASIC METABOLIC PANEL WITH GFR
BUN/Creatinine Ratio: 17 (calc) (ref 6–22)
BUN: 21 mg/dL (ref 7–25)
CO2: 27 mmol/L (ref 20–32)
Calcium: 9.2 mg/dL (ref 8.6–10.4)
Chloride: 107 mmol/L (ref 98–110)
Creat: 1.22 mg/dL — ABNORMAL HIGH (ref 0.60–0.93)
GFR, Est African American: 52 mL/min/{1.73_m2} — ABNORMAL LOW (ref >=60)
GFR, Est Non African American: 45 mL/min/{1.73_m2} — ABNORMAL LOW (ref >=60)
Glucose, Bld: 176 mg/dL — ABNORMAL HIGH (ref 65–99)
Potassium: 4.2 mmol/L (ref 3.5–5.3)
Sodium: 143 mmol/L (ref 135–146)

## 2019-07-08 MED ORDER — LEVOFLOXACIN 250 MG PO TABS: 250.0000 mg | ORAL_TABLET | Freq: Every day | ORAL | 0 refills | Status: DC

## 2019-07-08 MED ORDER — ATORVASTATIN CALCIUM 40 MG PO TABS: 20.0000 mg | ORAL_TABLET | Freq: Every day | ORAL | 1 refills | Status: DC

## 2019-07-08 NOTE — Progress Notes (Signed)
Name: Julie [name]   MRN: [id number]    DOB: 05/16/1946   Date:07/08/2019       Progress Note  Chief Complaint  Patient presents with  . Follow-up  . Hypertension  . Hyperlipidemia     Subjective:   Julie [name] is a 73 y.o. female, presents to clinic for routine follow up on the conditions listed above.  Diabetes Mellitus Type II: A1C August 6.7 - see endocrinology recent med changes Has next appt in early december Recent pertinent labs: Lab Results  Component Value Date   HGBA1C 9.7 (H) 06/24/2018   HGBA1C 9.7 (H) 03/14/2018   HGBA1C 9.0 (H) 12/09/2017    updated DM foot exam today and eye exam was done (going today as well - see ophtho monthly for tx monitoring)  Hyperlipidemia:  Current Medication Regimen:  lipitor Last Lipids: Through care everywhere, Dr. Conni Slipper labs done 05/27/2019 Total chol 134, HDL 42.7, LDL 72  Lab Results  Component Value Date   CHOL 135 12/23/2018   HDL 44 (L) 12/23/2018   LDLCALC 72 12/23/2018   TRIG 105 12/23/2018   CHOLHDL 3.1 12/23/2018   Leg swelling - seeing multiple specialists - Endocrine took her off actos for LE edema and wanted her to f/up with Nephrology Dr. Holley Raring  Recently seen here by me for lower extremity edema she was to do conservative measures including compression stockings elevation low-salt diet and Lasix very rarely for worsening lower extremity that did not improve with these other measures.  She reports that she has taken Lasix for 3 days in a row and then take 3 days off and then has started taking Lasix routinely like that.  She continues to have lower extremity edema, she has not gotten compression stockings  CKD - per nephrology -only part of the labs from urology are visible in the chart   Patient Active Problem List   Diagnosis Date Noted  . Stool guaiac positive 06/03/2017  . Dermatitis 01/04/2017  . Low back pain 06/06/2016  . Medication monitoring encounter 11/30/2015  .  Varicose vein of leg 11/30/2015  . Abnormal mammogram 08/04/2015  . Cardiac murmur 07/28/2015  . Hypertension   . Hyperlipidemia   . Diabetes mellitus with proliferative retinopathy ([place])   . CKD (chronic kidney disease) stage 2, GFR 60-89 ml/min   . Morbid obesity ([place])   . Osteoporosis   . Proteinuria 10/15/2013    Past Surgical History:  Procedure Laterality Date  . COLONOSCOPY WITH PROPOFOL N/A 07/16/2017   Procedure: COLONOSCOPY WITH PROPOFOL;  Surgeon: Robert Bellow, MD;  Location: [facility] ENDOSCOPY;  Service: Endoscopy;  Laterality: N/A;  . DILATION AND CURETTAGE OF UTERUS  2007  . ESOPHAGOGASTRODUODENOSCOPY (EGD) WITH PROPOFOL N/A 07/16/2017   Procedure: ESOPHAGOGASTRODUODENOSCOPY (EGD) WITH PROPOFOL;  Surgeon: Robert Bellow, MD;  Location: [facility];  Service: Endoscopy;  Laterality: N/A;  . MOUTH SURGERY  2007   cyst  . TONSILLECTOMY      Family History  Problem Relation Age of Onset  . Glaucoma Mother   . Cancer Father        liver  . Cancer Sister        lung, bone  . COPD Sister   . Heart disease Maternal Grandmother        chf  . Hypertension Brother   . Hypertension Maternal Uncle   . Ovarian cancer Paternal Grandmother   . Cancer Paternal Grandmother        ovarian  cancer  . Breast cancer Neg Hx     Social History   Socioeconomic History  . Marital status: Married    Spouse name: Rosita Fire  . Number of children: 1  . Years of education: some college  . Highest education level: 12th grade  Occupational History  . Occupation: Retired  Scientific laboratory technician  . Financial resource strain: Very hard  . Food insecurity    Worry: Never true    Inability: Never true  . Transportation needs    Medical: No    Non-medical: No  Tobacco Use  . Smoking status: Never Smoker  . Smokeless tobacco: Never Used  . Tobacco comment: smoking cessation materials not required  Substance and Sexual Activity  . Alcohol use: No  . Drug use: No  . Sexual activity:  Not Currently  Lifestyle  . Physical activity    Days per week: 0 days    Minutes per session: 0 min  . Stress: Not at all  Relationships  . Social Herbalist on phone: Patient refused    Gets together: Patient refused    Attends religious service: Patient refused    Active member of club or organization: Patient refused    Attends meetings of clubs or organizations: Patient refused    Relationship status: Married  . Intimate partner violence    Fear of current or ex partner: No    Emotionally abused: No    Physically abused: No    Forced sexual activity: No  Other Topics Concern  . Not on file  Social History Narrative  . Not on file     Current Outpatient Medications:  .  alendronate (FOSAMAX) 70 MG tablet, Take 70 mg by mouth once a week. , Disp: , Rfl:  .  amLODipine (NORVASC) 10 MG tablet, TAKE 1 TABLET BY MOUTH  DAILY, Disp: 90 tablet, Rfl: 1 .  aspirin 81 MG tablet, Take 81 mg by mouth daily., Disp: , Rfl:  .  atorvastatin (LIPITOR) 40 MG tablet, Take 1 tablet (40 mg total) by mouth at bedtime., Disp: 90 tablet, Rfl: 1 .  bevacizumab (AVASTIN) 1.25 mg/0.1 mL SOLN, 10 mg by Intravitreal route to Surgery., Disp: , Rfl:  .  Cholecalciferol (VITAMIN D-1000 MAX ST) 1000 UNITS tablet, Take 1,000 Units by mouth daily., Disp: , Rfl:  .  furosemide (LASIX) 20 MG tablet, Take 1 tablet (20 mg total) by mouth daily as needed for edema. 1 to 3 days use only, Disp: 20 tablet, Rfl: 0 .  glipiZIDE (GLUCOTROL XL) 10 MG 24 hr tablet, TAKE 1 TABLET BY MOUTH  TWICE A DAY, Disp: 180 tablet, Rfl: 1 .  hydrochlorothiazide (HYDRODIURIL) 25 MG tablet, TAKE 1 TABLET BY MOUTH  DAILY, Disp: 90 tablet, Rfl: 1 .  lisinopril (ZESTRIL) 40 MG tablet, TAKE 1 TABLET BY MOUTH  DAILY, Disp: 90 tablet, Rfl: 1 .  metFORMIN (GLUCOPHAGE) 500 MG tablet, TAKE 2 TABLETS BY MOUTH TWO TIMES DAILY, Disp: 360 tablet, Rfl: 1 .  Multiple Vitamin (MULTI-VITAMINS) TABS, Take by mouth daily. , Disp: , Rfl:  .   Omega-3 Fatty Acids (FISH OIL) 1000 MG CAPS, Take 1,000 mg by mouth 2 (two) times daily., Disp: , Rfl:  .  pioglitazone (ACTOS) 15 MG tablet, Take 1 tablet (15 mg total) by mouth daily. (Patient not taking: Reported on 07/08/2019), Disp: 90 tablet, Rfl: 0  Current Facility-Administered Medications:  .  triamcinolone acetonide (KENALOG) 10 MG/ML injection 10 mg, 10 mg, Other, Once,  [name], DPM  Allergies  Allergen Reactions  . Nsaids Other (See Comments)  . Penicillins Other (See Comments)    Not sure of reaction     I personally reviewed active problem list, medication list, allergies, family history, social history, health maintenance, notes from last encounter, lab results with the patient/caregiver today.  Review of Systems  Constitutional: Negative.   HENT: Negative.   Eyes: Negative.   Respiratory: Negative.   Cardiovascular: Negative.   Gastrointestinal: Negative.   Endocrine: Negative.   Genitourinary: Negative.   Musculoskeletal: Negative.   Skin: Negative.   Allergic/Immunologic: Negative.   Neurological: Negative.   Hematological: Negative.   Psychiatric/Behavioral: Negative.   All other systems reviewed and are negative.    Objective:    Vitals:   07/08/19 1124  BP: (!) 142/62  Pulse: 86  Resp: 14  Temp: 98.1 F (36.7 C)  SpO2: 98%  Weight: 285 lb 14.4 oz (129.7 kg)  Height: 5\' 2"  (1.575 m)    Body mass index is 52.29 kg/m.  Physical Exam Vitals signs and nursing note reviewed.  Constitutional:      Appearance: She is well-developed. She is obese.  HENT:     Head: Normocephalic and atraumatic.     Nose: Nose normal.  Eyes:     General:        Right eye: No discharge.        Left eye: No discharge.     Conjunctiva/sclera: Conjunctivae normal.  Neck:     Trachea: No tracheal deviation.  Cardiovascular:     Rate and Rhythm: Regular rhythm. Tachycardia present.  No extrasystoles are present.    Chest Wall: PMI is not displaced.      Pulses: Normal pulses.          Radial pulses are 2+ on the right side and 2+ on the left side.       Dorsalis pedis pulses are 2+ on the right side and 2+ on the left side.     Heart sounds: Normal heart sounds. No murmur. No friction rub. No gallop.      Comments: B/l pedal, ankle and pretibial pitting edema Pulmonary:     Effort: Pulmonary effort is normal. Tachypnea present. No prolonged expiration, respiratory distress or retractions.     Breath sounds: Normal breath sounds. No stridor or decreased air movement. No decreased breath sounds, wheezing, rhonchi or rales.  Musculoskeletal: Normal range of motion.     Right lower leg: 2+ Edema present.     Left lower leg: 2+ Edema present.  Skin:    General: Skin is warm and dry.     Coloration: Skin is not ashen, cyanotic, mottled or pale.     Findings: No rash or wound.     Nails: There is no clubbing.      Comments: Bilateral medial ankle hyperpigmentation  Neurological:     Mental Status: She is alert.     Motor: No abnormal muscle tone.     Coordination: Coordination normal.  Psychiatric:        Behavior: Behavior normal.      Recent Results (from the past 2160 hour(s))  Brain natriuretic peptide     Status: None   Collection Time: 06/17/19 12:00 AM  Result Value Ref Range   Brain Natriuretic Peptide 38 <100 pg/mL    Comment: . BNP levels increase with age in the general population with the highest values seen in individuals greater than 75 years of  age. Reference: J. Am. [place] Ar. Cardiol. [id number]. .   COMPLETE METABOLIC PANEL WITH GFR     Status: Abnormal   Collection Time: 06/17/19 12:00 AM  Result Value Ref Range   Glucose, Bld 148 (H) 65 - 99 mg/dL    Comment: .            Fasting reference interval . For someone without known diabetes, a glucose value >125 mg/dL indicates that they may have diabetes and this should be confirmed with a follow-up test. .    BUN 26 (H) 7 - 25 mg/dL   Creat 1.18 (H)  0.60 - 0.93 mg/dL    Comment: For patients >53 years of age, the reference limit for Creatinine is approximately 13% higher for people identified as African-American. .    GFR, Est Non African American 46 (L) > OR = 60 mL/min/1.80m2   GFR, Est African American 54 (L) > OR = 60 mL/min/1.77m2   BUN/Creatinine Ratio 22 6 - 22 (calc)   Sodium 142 135 - 146 mmol/L   Potassium 4.3 3.5 - 5.3 mmol/L   Chloride 106 98 - 110 mmol/L   CO2 28 20 - 32 mmol/L   Calcium 9.5 8.6 - 10.4 mg/dL   Total Protein 6.0 (L) 6.1 - 8.1 g/dL   Albumin 3.8 3.6 - 5.1 g/dL   Globulin 2.2 1.9 - 3.7 g/dL (calc)   AG Ratio 1.7 1.0 - 2.5 (calc)   Total Bilirubin 0.4 0.2 - 1.2 mg/dL   Alkaline phosphatase (APISO) 71 37 - 153 U/L   AST 17 10 - 35 U/L   ALT 14 6 - 29 U/L     PHQ2/9: Depression screen [facility] 2/9 07/08/2019 06/17/2019 12/23/2018 09/23/2018 06/20/2018  Decreased Interest 0 1 0 0 1  Down, Depressed, Hopeless 2 1 0 1 1  PHQ - 2 Score 2 2 0 1 2  Altered sleeping 1 0 0 2 3  Tired, decreased energy 1 1 0 1 0  Change in appetite 0 1 0 2 3  Feeling bad or failure about yourself  0 0 0 0 1  Trouble concentrating 0 0 0 0 0  Moving slowly or fidgety/restless 0 0 0 0 0  Suicidal thoughts 0 0 0 0 0  PHQ-9 Score 4 4 0 6 9  Difficult doing work/chores Somewhat difficult Somewhat difficult Not difficult at all Not difficult at all Somewhat difficult    phq 9 is negative Reviewed today  Fall Risk: Fall Risk  07/08/2019 06/17/2019 12/23/2018 09/23/2018 06/20/2018  Falls in the past year? 1 1 1 1  Yes  Number falls in past yr: 0 1 0 0 1  Injury with Fall? 1 1 0 0 Yes  Risk Factor Category  - - - - High Fall Risk  Risk for fall due to : - - - - Impaired vision;Impaired balance/gait  Risk for fall due to: Comment - - - - wears eyeglasses; ambulates with cane; knee pain  Follow up - - - - Falls evaluation completed;Education provided;Falls prevention discussed      Functional Status Survey: Is the patient deaf or have  difficulty hearing?: Yes Does the patient have difficulty seeing, even when wearing glasses/contacts?: Yes Does the patient have difficulty concentrating, remembering, or making decisions?: No Does the patient have difficulty walking or climbing stairs?: No Does the patient have difficulty dressing or bathing?: No Does the patient have difficulty doing errands alone such as visiting a doctor's office or shopping?: No  Assessment & Plan:     ICD-10-CM   1. Bilateral lower extremity edema  [id number] BASIC METABOLIC PANEL WITH GFR    Ambulatory referral to Vascular Surgery  2. Puncture wound of plantar aspect of right foot, initial encounter  S91.331A CBC with Differential/Platelet    BASIC METABOLIC PANEL WITH GFR    Td : Tetanus/diphtheria >7yo Preservative  free  3. CKD (chronic kidney disease) stage 2, GFR 60-89 ml/min  N18.2    Rechecking renal function due to excessive Lasix use, checking potassium  4. Essential hypertension  99991111 BASIC METABOLIC PANEL WITH GFR  5. Mixed hyperlipidemia  E78.2 atorvastatin (LIPITOR) 40 MG tablet   Per specialist, continue diet, exercise, medications  6. Type 2 diabetes mellitus with both eyes affected by proliferative retinopathy and macular edema, without long-term current use of insulin ([place])  [id number]    Poor hygiene to bilateral feet but good sensation follow-up with podiatry  7. Need for influenza vaccination  Z23 Flu Vaccine QUAD High Dose(Fluad)   With lasix overuse rechecking renal function today-Advised to take very very sparingly and only to decrease swelling enough to get compression stockings on  Last labs reviewed BNP was normal, liver function normal Suspect more dependent edema/venous stasis -refer to vascular  Foot wound with DM - feet with poor hygeine today, she has difficulty with checking feet, but has good sensation, abx for 3 days finish to 5 d if signs of infection or pain, has podiatry f/up next week. Reviewed black box warning  for levaquin  Td booster given  No follow-ups on file.   Delsa Grana, PA-C 07/08/19 11:41 AM

## 2019-07-10 ENCOUNTER — Other Ambulatory Visit: Payer: Self-pay | Admitting: Emergency Medicine

## 2019-07-10 DIAGNOSIS — E113513 Type 2 diabetes mellitus with proliferative diabetic retinopathy with macular edema, bilateral: Secondary | ICD-10-CM

## 2019-07-10 DIAGNOSIS — S91331A Puncture wound without foreign body, right foot, initial encounter: Secondary | ICD-10-CM

## 2019-07-10 MED ORDER — LEVOFLOXACIN 500 MG PO TABS: 500.0000 mg | ORAL_TABLET | Freq: Every day | ORAL | 0 refills | Status: AC

## 2019-07-10 NOTE — Telephone Encounter (Signed)
Please send levaquin again to local pharmacy [facility]. It was sent on 9/23 but it went to mail order

## 2019-07-10 NOTE — Telephone Encounter (Signed)
Antibiotics sent into local pharmacy Mistakenly sent to mail order pharmacy  At the time of her visit we did discuss black box warning on Levaquin, also discussed potential interaction with glipizide which would cause lower blood sugar she was encouraged to watch her blood sugars more carefully  Feel the medication risks are worth the benefit with puncture wound to the foot in a diabetic patient with poor hygiene and inability to visualize her foot  Delsa Grana, PA-C

## 2019-07-13 DIAGNOSIS — E113212 Type 2 diabetes mellitus with mild nonproliferative diabetic retinopathy with macular edema, left eye: Secondary | ICD-10-CM | POA: Diagnosis not present

## 2019-07-13 DIAGNOSIS — E119 Type 2 diabetes mellitus without complications: Secondary | ICD-10-CM | POA: Diagnosis not present

## 2019-07-15 IMAGING — CR DG CHEST 2V
1 series · 2 of 2 positions shown · non-contrast
Comparison: Report 06/24/2001

CLINICAL DATA: Altered mental status with weakness

EXAM:
CHEST - 2 VIEW

[Series 1: w chest pa · 0.14mm/px · 2 of 2 slices shown]
[im 1/2]
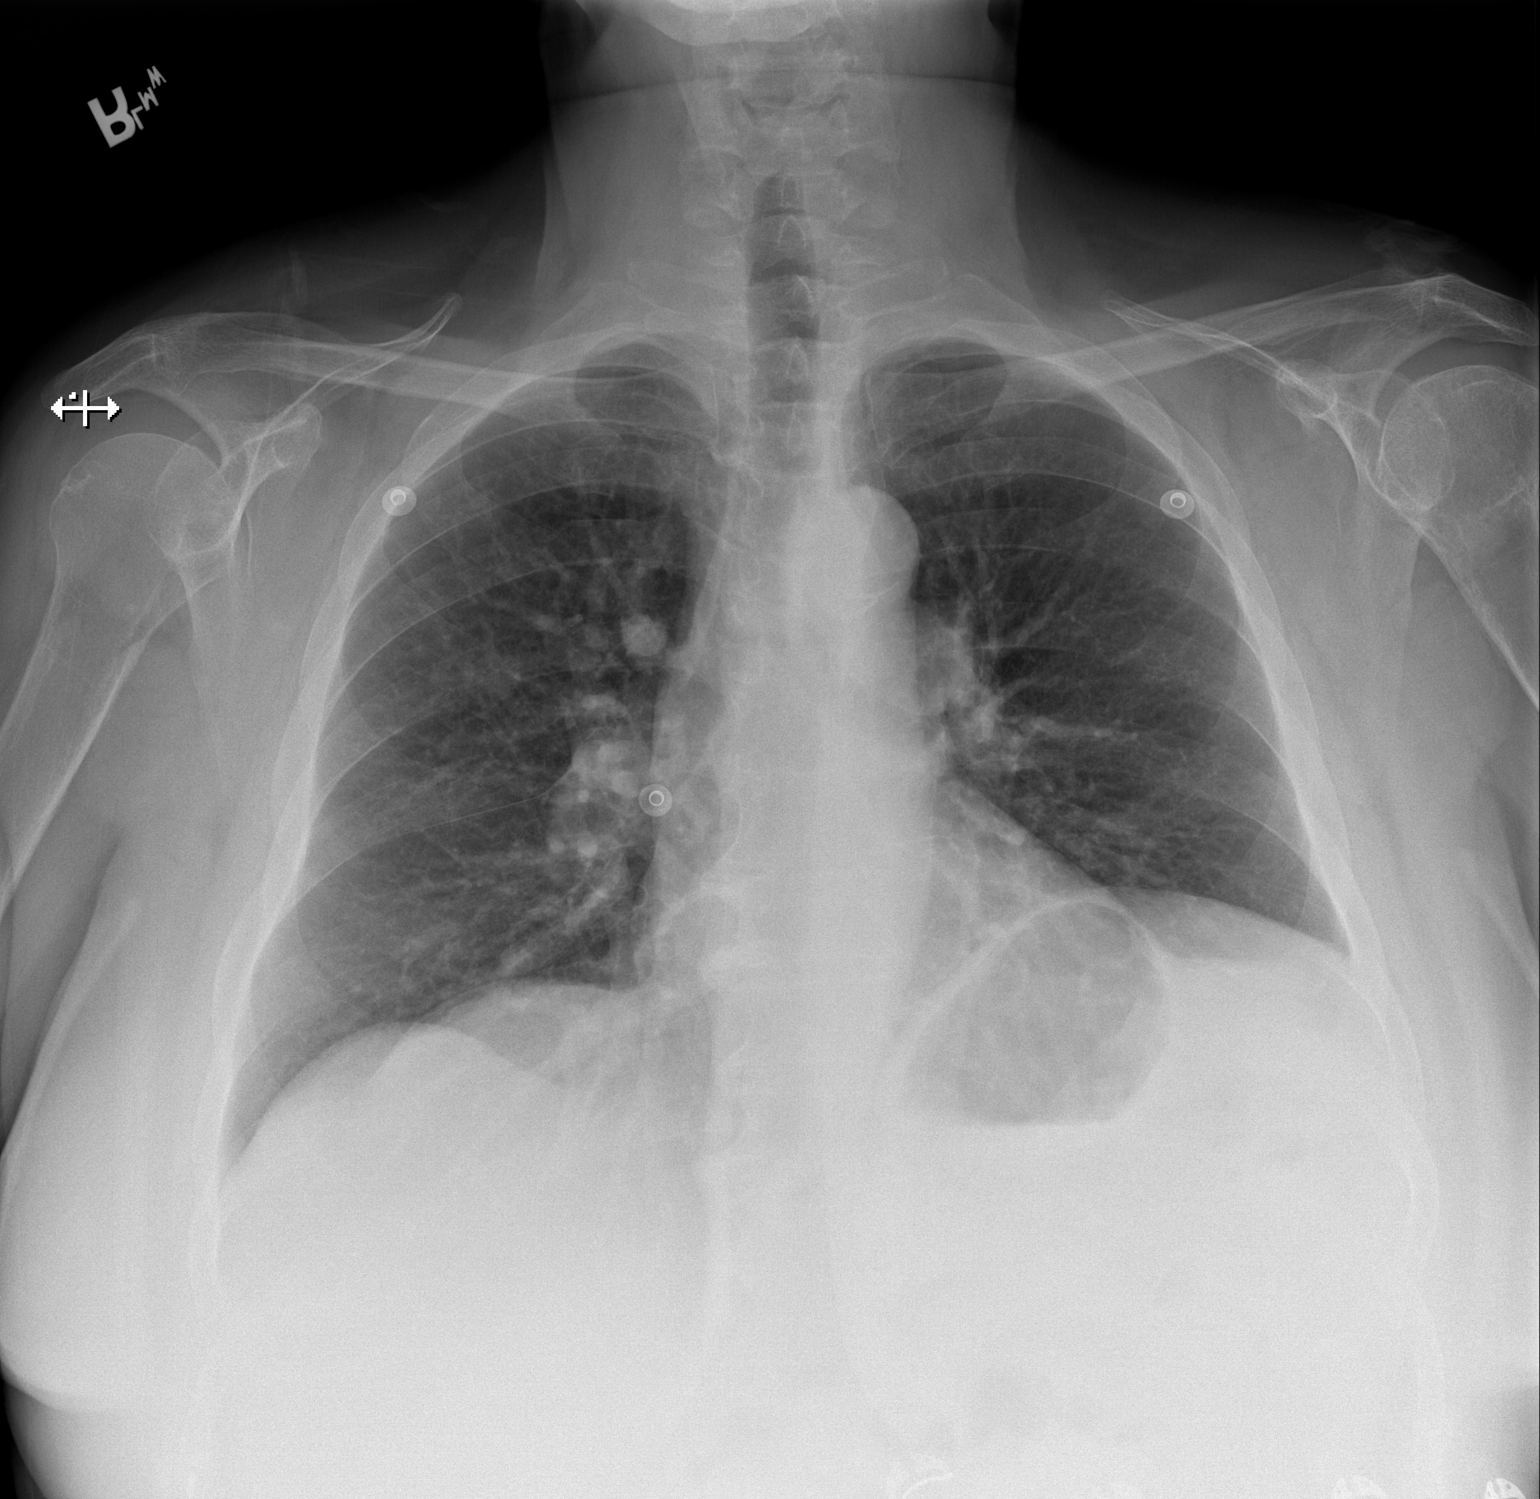
[im 2/2]
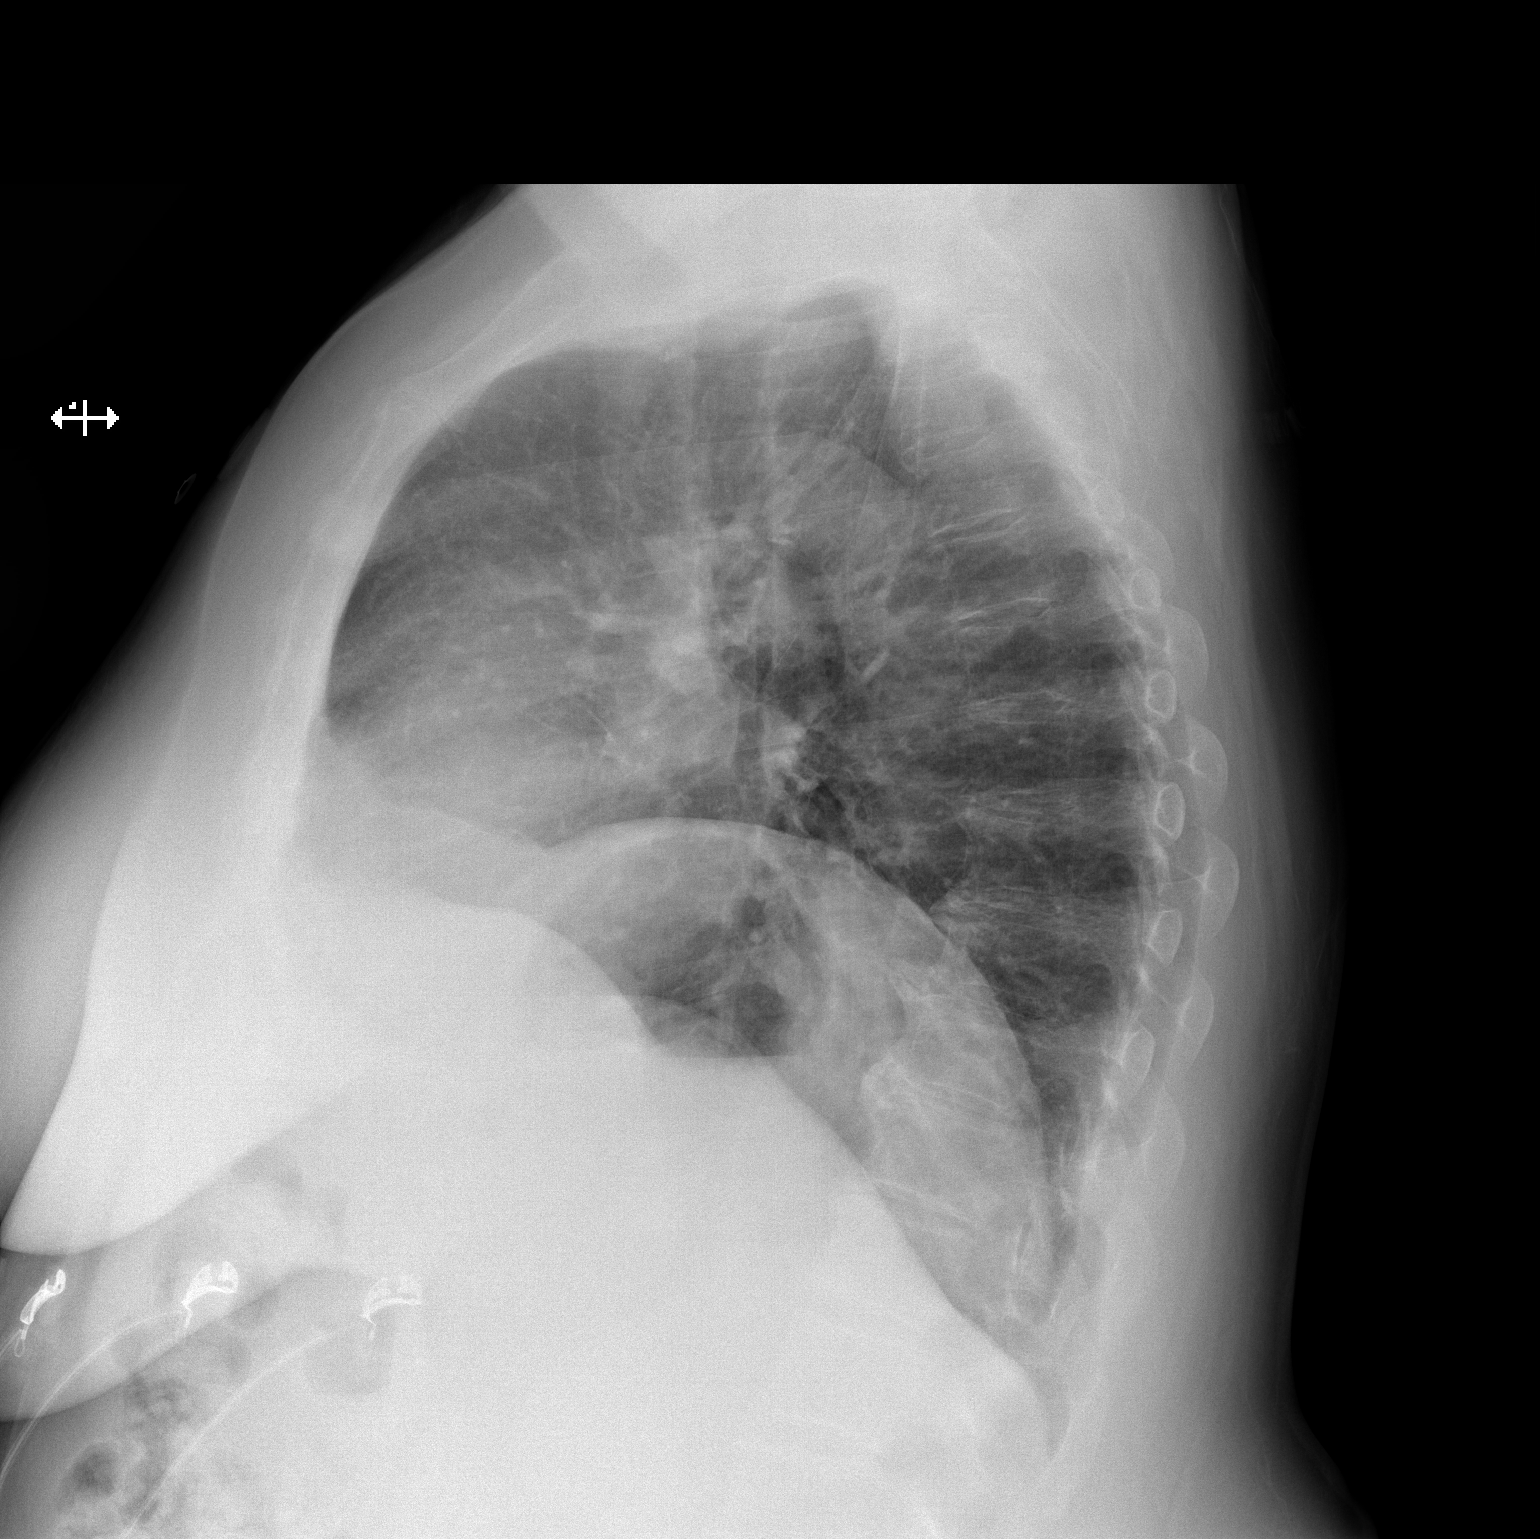

[2 of 2 positions shown; findings below may reference images not displayed]

FINDINGS: Chronic elevation of left diaphragm. No focal opacity or pleural
effusion. Normal heart size with aortic atherosclerosis. No
pneumothorax. Degenerative changes of the spine
IMPRESSION: No active cardiopulmonary disease.

## 2019-07-15 IMAGING — CT CT HEAD W/O CM
3 series · 16 of 47 positions shown, 19 images · non-contrast
Comparison: None.

CLINICAL DATA: Headache

EXAM:
CT HEAD WITHOUT CONTRAST
TECHNIQUE: Contiguous axial images were obtained from the base of the skull
through the vertex without intravenous contrast.

[Series 2: head wo · axial · 0.41mm/px · z∈[-118,+7]mm · 10 of 30 slices shown, 13 images]
[im 3/30  brain]
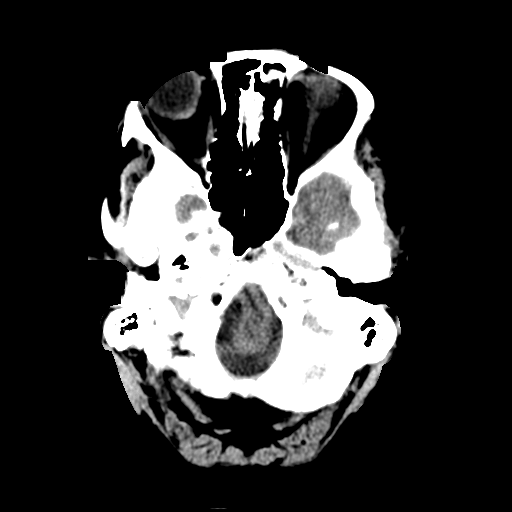
[im 3/30  bone]
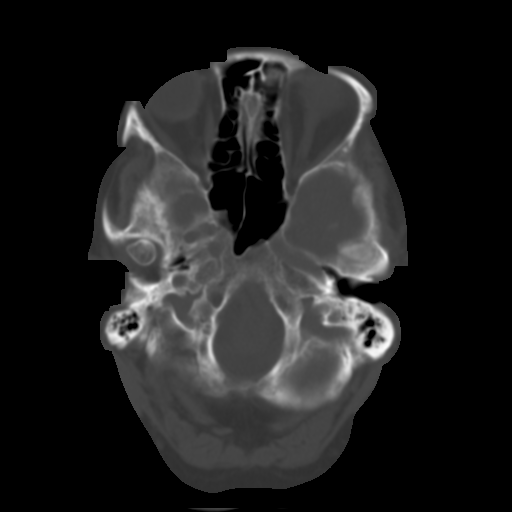
[im 6/30  brain]
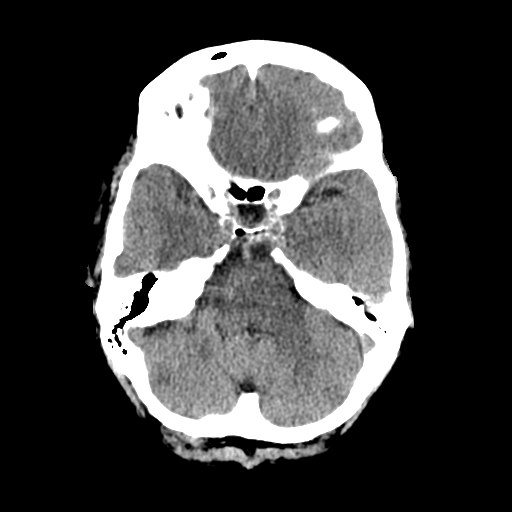
[im 9/30  brain]
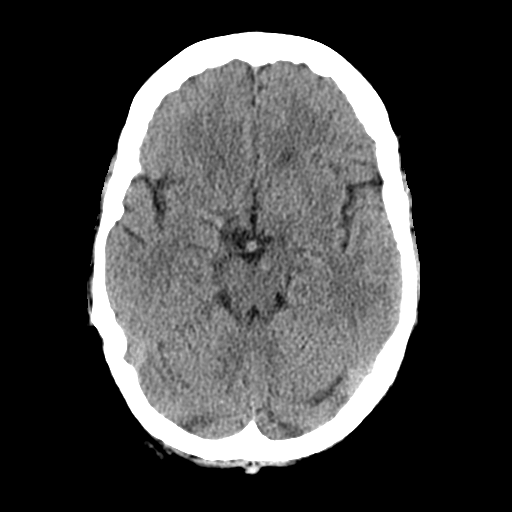
[im 11/30  brain]
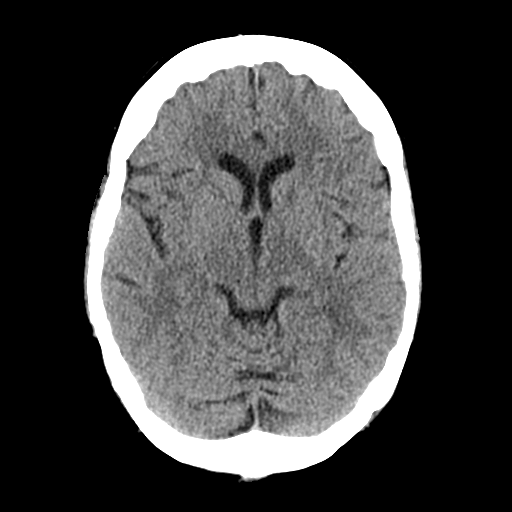
[im 14/30  brain]
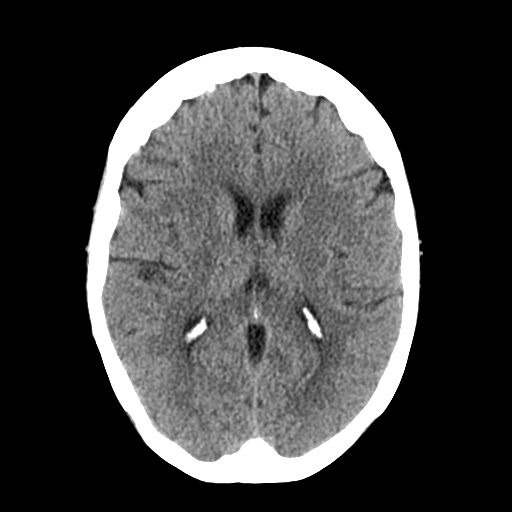
[im 14/30  bone]
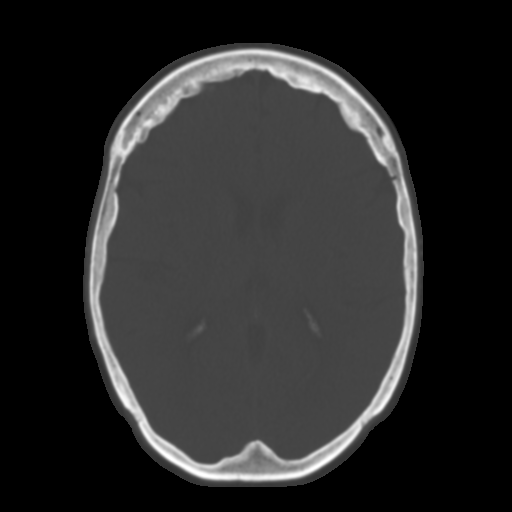
[im 17/30  brain]
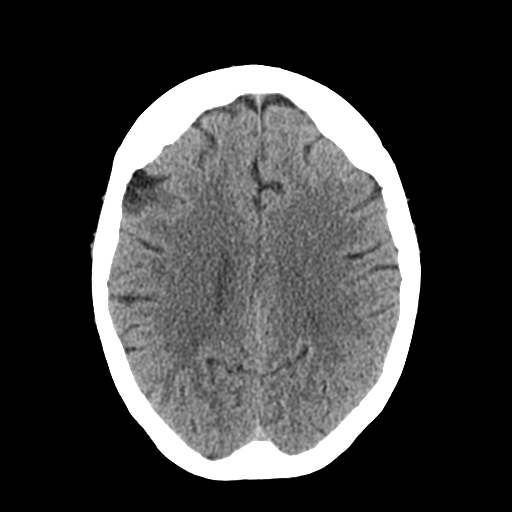
[im 20/30  brain]
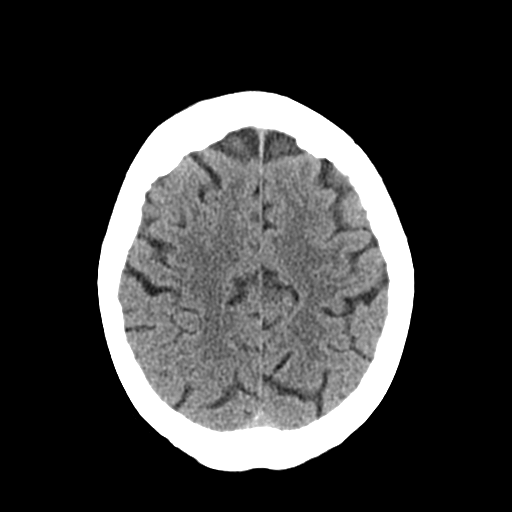
[im 23/30  brain]
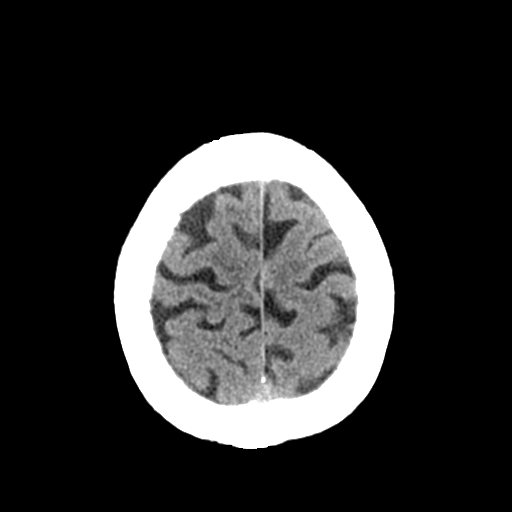
[im 25/30  brain]
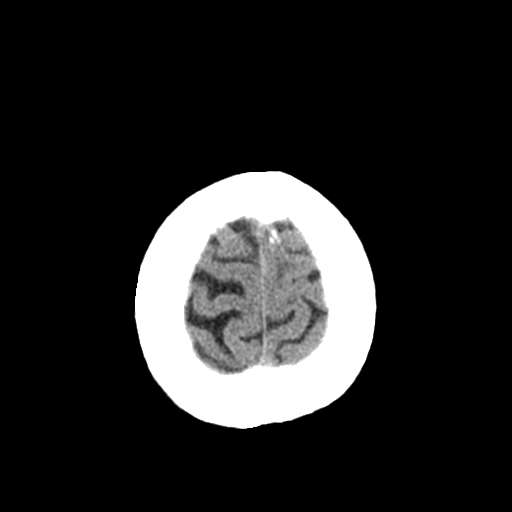
[im 25/30  bone]
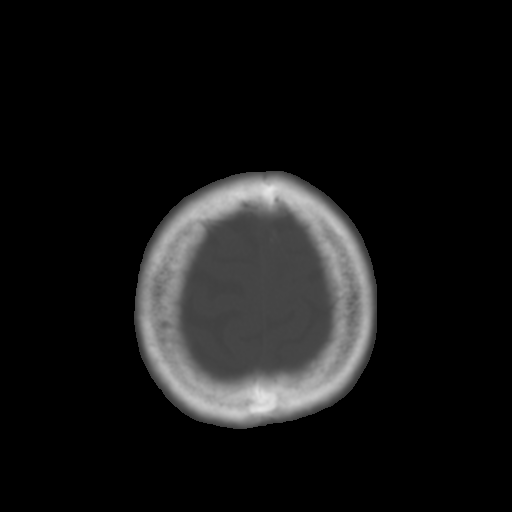
[im 28/30  brain]
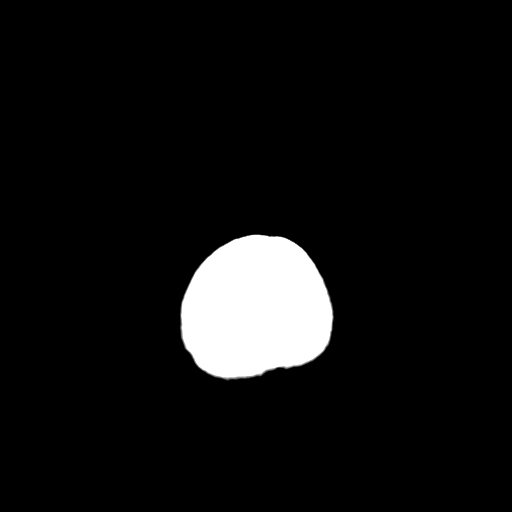

[Series 4: coronal soft tissue · coronal · 0.30mm/px · 3 of 62 slices shown]
[im 21/62  brain]
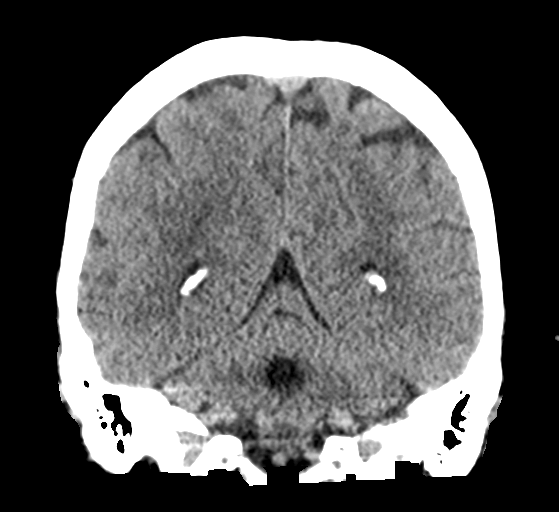
[im 28/62  brain]
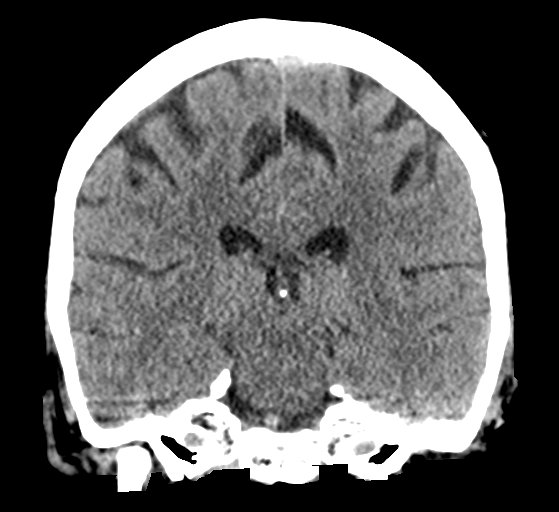
[im 34/62  brain]
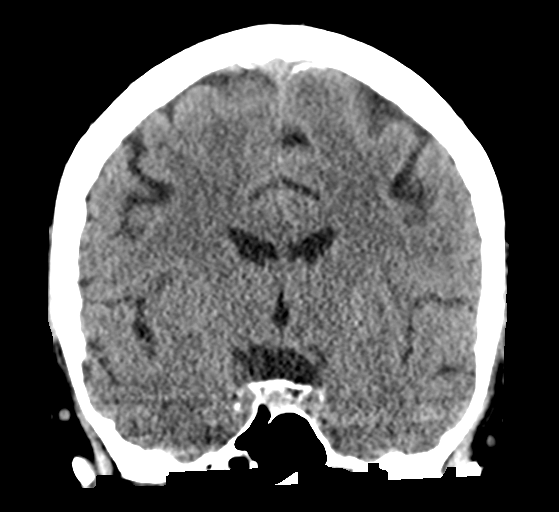

[Series 5: sagittal soft tissue · sagittal · 0.30mm/px · 3 of 49 slices shown]
[im 17/49  brain]
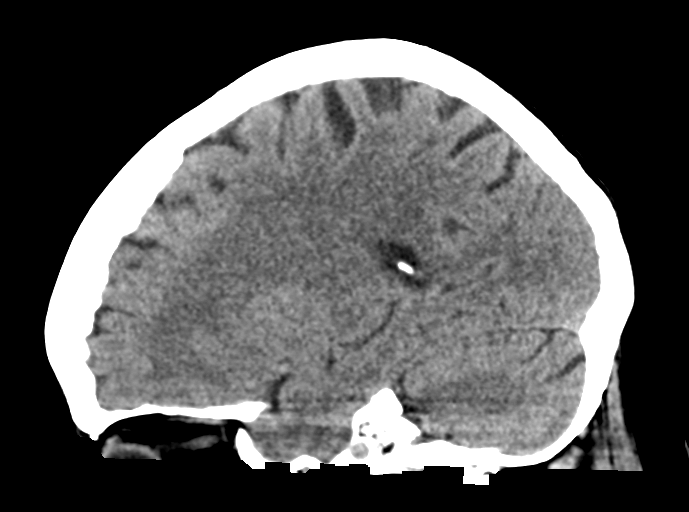
[im 25/49  brain]
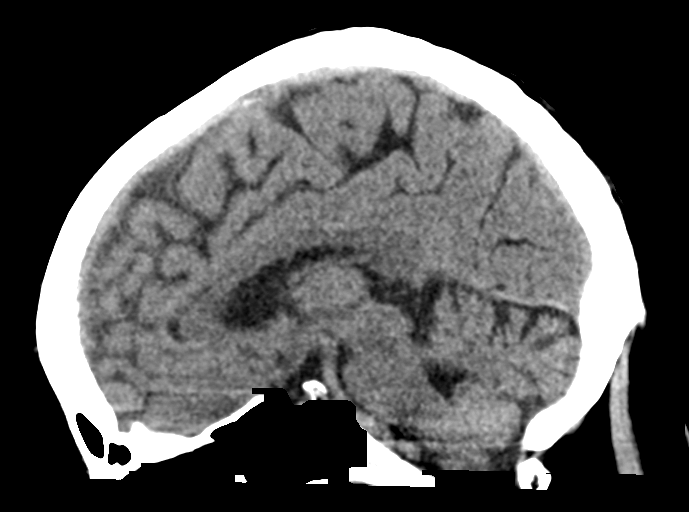
[im 33/49  brain]
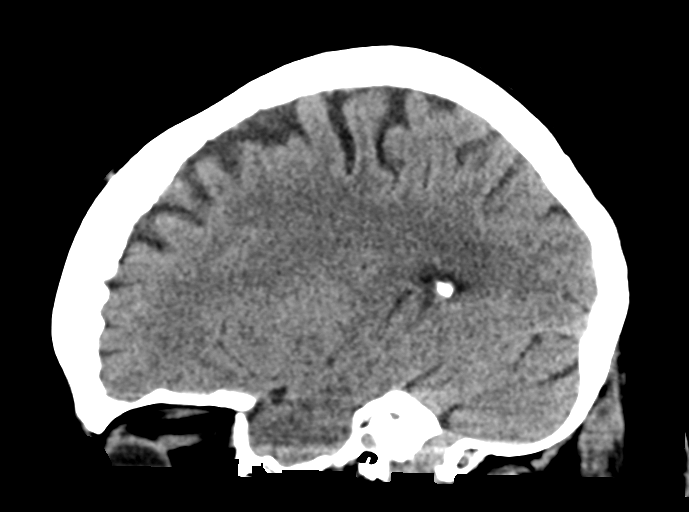

[16 of 47 positions shown; findings below may reference images not displayed]

FINDINGS: Brain: No acute territorial infarction, hemorrhage or intracranial
mass. Ventricles are nonenlarged.

Vascular: No hyperdense vessels.  Carotid vascular calcification

Skull: Normal. Negative for fracture or focal lesion.

Sinuses/Orbits: No acute finding.

Other: None
IMPRESSION: Negative.  No CT evidence for acute intracranial abnormality.

## 2019-07-21 ENCOUNTER — Other Ambulatory Visit: Payer: Self-pay | Admitting: Family Medicine

## 2019-09-02 LAB — HM DIABETES EYE EXAM

## 2019-09-05 ENCOUNTER — Other Ambulatory Visit: Payer: Self-pay | Admitting: Family Medicine

## 2019-09-18 ENCOUNTER — Other Ambulatory Visit: Payer: Self-pay | Admitting: Family Medicine

## 2019-09-22 ENCOUNTER — Other Ambulatory Visit: Payer: Self-pay

## 2019-09-23 MED ORDER — AMLODIPINE BESYLATE 10 MG PO TABS: 10.0000 mg | ORAL_TABLET | Freq: Every day | ORAL | 0 refills | Status: DC

## 2019-09-29 DIAGNOSIS — I10 Essential (primary) hypertension: Secondary | ICD-10-CM | POA: Insufficient documentation

## 2019-09-29 DIAGNOSIS — E785 Hyperlipidemia, unspecified: Secondary | ICD-10-CM | POA: Insufficient documentation

## 2019-09-29 DIAGNOSIS — E782 Mixed hyperlipidemia: Secondary | ICD-10-CM | POA: Insufficient documentation

## 2019-11-10 ENCOUNTER — Encounter: Payer: Self-pay | Admitting: Family Medicine

## 2019-11-10 ENCOUNTER — Ambulatory Visit (INDEPENDENT_AMBULATORY_CARE_PROVIDER_SITE_OTHER): Payer: Medicare Other | Admitting: Family Medicine

## 2019-11-10 ENCOUNTER — Other Ambulatory Visit: Payer: Self-pay

## 2019-11-10 VITALS — BP 136/80 | HR 83 | Temp 98.9°F | Resp 14 | Ht 64.0 in | Wt 280.8 lb

## 2019-11-10 DIAGNOSIS — Z1231 Encounter for screening mammogram for malignant neoplasm of breast: Secondary | ICD-10-CM

## 2019-11-10 DIAGNOSIS — M810 Age-related osteoporosis without current pathological fracture: Secondary | ICD-10-CM

## 2019-11-10 DIAGNOSIS — E782 Mixed hyperlipidemia: Secondary | ICD-10-CM

## 2019-11-10 DIAGNOSIS — N182 Chronic kidney disease, stage 2 (mild): Secondary | ICD-10-CM | POA: Diagnosis not present

## 2019-11-10 DIAGNOSIS — Z5181 Encounter for therapeutic drug level monitoring: Secondary | ICD-10-CM

## 2019-11-10 DIAGNOSIS — E559 Vitamin D deficiency, unspecified: Secondary | ICD-10-CM

## 2019-11-10 DIAGNOSIS — I10 Essential (primary) hypertension: Secondary | ICD-10-CM

## 2019-11-10 MED ORDER — AMLODIPINE BESYLATE 10 MG PO TABS: 10.0000 mg | ORAL_TABLET | Freq: Every day | ORAL | 3 refills | Status: DC

## 2019-11-10 NOTE — Progress Notes (Signed)
Name: Julie [name]   MRN: [id number]    DOB: [birth year]   Date:11/10/2019       Progress Note  Chief Complaint  Patient presents with  . Follow-up  . Medication Refill  . Diabetes  . Hypertension     Subjective:   Julie [name] is a 74 y.o. female, presents to clinic for routine follow up on the conditions listed above.  DM through [facility] Last A1C was good at 7.0, switched from actos, gliipizide and metformin, some lows over the past severeal months down to 40's she has f/up with endo, changed eating habits and snacks and    Hyperlipidemia: Current Medication Regimen:  lipitor 40 mg Last Lipids: Lab Results  Component Value Date   CHOL 135 12/23/2018   HDL 44 (L) 12/23/2018   LDLCALC 72 12/23/2018   TRIG 105 12/23/2018   CHOLHDL 3.1 12/23/2018  - Denies: Chest pain, shortness of breath, myalgias.  Hypertension:  Currently managed on lisinopril and amlodipine  Pt reports good  med compliance and denies any SE.  No lightheadedness, hypotension, syncope. Blood pressure today is well controlled. BP Readings from Last 3 Encounters:  11/10/19 136/80  07/08/19 (!) 142/62  06/17/19 (!) 146/58   Pt denies CP, SOB, exertional sx, LE edema, palpitation, Ha's, visual disturbances  Osteoporosis - on fosamax for the past year, on vit d supplement, last dexa was 2017  Patient Active Problem List   Diagnosis Date Noted  . Bilateral lower extremity edema 07/08/2019  . Stool guaiac positive 06/03/2017  . Dermatitis 01/04/2017  . Low back pain 06/06/2016  . Medication monitoring encounter 11/30/2015  . Varicose vein of leg 11/30/2015  . Abnormal mammogram 08/04/2015  . Cardiac murmur 07/28/2015  . Hypertension   . Hyperlipidemia   . Diabetes mellitus with proliferative retinopathy ([place])   . CKD (chronic kidney disease) stage 2, GFR 60-89 ml/min   . Morbid obesity ([place])   . Osteoporosis   . Proteinuria 10/15/2013    Past Surgical History:  Procedure  Laterality Date  . COLONOSCOPY WITH PROPOFOL N/A 07/16/2017   Procedure: COLONOSCOPY WITH PROPOFOL;  Surgeon: Robert Bellow, MD;  Location: [facility] ENDOSCOPY;  Service: Endoscopy;  Laterality: N/A;  . DILATION AND CURETTAGE OF UTERUS  2007  . ESOPHAGOGASTRODUODENOSCOPY (EGD) WITH PROPOFOL N/A 07/16/2017   Procedure: ESOPHAGOGASTRODUODENOSCOPY (EGD) WITH PROPOFOL;  Surgeon: Robert Bellow, MD;  Location: [facility];  Service: Endoscopy;  Laterality: N/A;  . MOUTH SURGERY  2007   cyst  . TONSILLECTOMY      Family History  Problem Relation Age of Onset  . Glaucoma Mother   . Cancer Father        liver  . Cancer Sister        lung, bone  . COPD Sister   . Heart disease Maternal Grandmother        chf  . Hypertension Brother   . Hypertension Maternal Uncle   . Ovarian cancer Paternal Grandmother   . Cancer Paternal Grandmother        ovarian cancer  . Breast cancer Neg Hx     Social History   Socioeconomic History  . Marital status: Married    Spouse name: Rosita Fire  . Number of children: 1  . Years of education: some college  . Highest education level: 12th grade  Occupational History  . Occupation: Retired  Tobacco Use  . Smoking status: Never Smoker  . Smokeless tobacco: Never Used  . Tobacco  comment: smoking cessation materials not required  Substance and Sexual Activity  . Alcohol use: No  . Drug use: No  . Sexual activity: Not Currently  Other Topics Concern  . Not on file  Social History Narrative  . Not on file   Social Determinants of Health   Financial Resource Strain:   . Difficulty of Paying Living Expenses: Not on file  Food Insecurity:   . Worried About Charity fundraiser in the Last Year: Not on file  . Ran Out of Food in the Last Year: Not on file  Transportation Needs:   . Lack of Transportation (Medical): Not on file  . Lack of Transportation (Non-Medical): Not on file  Physical Activity:   . Days of Exercise per Week: Not on file  .  Minutes of Exercise per Session: Not on file  Stress:   . Feeling of Stress : Not on file  Social Connections:   . Frequency of Communication with Friends and Family: Not on file  . Frequency of Social Gatherings with Friends and Family: Not on file  . Attends Religious Services: Not on file  . Active Member of Clubs or Organizations: Not on file  . Attends Archivist Meetings: Not on file  . Marital Status: Not on file  Intimate Partner Violence:   . Fear of Current or Ex-Partner: Not on file  . Emotionally Abused: Not on file  . Physically Abused: Not on file  . Sexually Abused: Not on file     Current Outpatient Medications:  .  alendronate (FOSAMAX) 70 MG tablet, Take 70 mg by mouth once a week. , Disp: , Rfl:  .  amLODipine (NORVASC) 10 MG tablet, Take 1 tablet (10 mg total) by mouth daily., Disp: 30 tablet, Rfl: 0 .  aspirin 81 MG tablet, Take 81 mg by mouth daily., Disp: , Rfl:  .  atorvastatin (LIPITOR) 40 MG tablet, Take 0.5 tablets (20 mg total) by mouth at bedtime., Disp: 90 tablet, Rfl: 1 .  bevacizumab (AVASTIN) 1.25 mg/0.1 mL SOLN, 10 mg by Intravitreal route to Surgery., Disp: , Rfl:  .  Cholecalciferol (VITAMIN D-1000 MAX ST) 1000 UNITS tablet, Take 1,000 Units by mouth daily., Disp: , Rfl:  .  glipiZIDE (GLUCOTROL XL) 10 MG 24 hr tablet, TAKE 1 TABLET BY MOUTH  TWICE A DAY, Disp: 180 tablet, Rfl: 1 .  hydrochlorothiazide (HYDRODIURIL) 25 MG tablet, TAKE 1 TABLET BY MOUTH  DAILY, Disp: 90 tablet, Rfl: 1 .  lisinopril (ZESTRIL) 40 MG tablet, TAKE 1 TABLET BY MOUTH  DAILY, Disp: 90 tablet, Rfl: 3 .  metFORMIN (GLUCOPHAGE) 500 MG tablet, TAKE 2 TABLETS BY MOUTH TWO TIMES DAILY, Disp: 360 tablet, Rfl: 1 .  Multiple Vitamin (MULTI-VITAMINS) TABS, Take by mouth daily. , Disp: , Rfl:  .  Omega-3 Fatty Acids (FISH OIL) 1000 MG CAPS, Take 1,000 mg by mouth 2 (two) times daily., Disp: , Rfl:  .  pioglitazone (ACTOS) 15 MG tablet, Take 1 tablet (15 mg total) by mouth  daily. (Patient not taking: Reported on 07/08/2019), Disp: 90 tablet, Rfl: 0  Current Facility-Administered Medications:  .  triamcinolone acetonide (KENALOG) 10 MG/ML injection 10 mg, 10 mg, Other, Once, [place], Tamala Fothergill, DPM  Allergies  Allergen Reactions  . Nsaids Other (See Comments)  . Penicillins Other (See Comments)    Not sure of reaction     Chart Review Today: I personally reviewed active problem list, medication list, allergies, family history, social history,  health maintenance, notes from last encounter, lab results, imaging with the patient/caregiver today.   Review of Systems  Constitutional: Negative.   HENT: Negative.   Eyes: Negative.   Respiratory: Negative.   Cardiovascular: Negative.   Gastrointestinal: Negative.   Endocrine: Negative.   Genitourinary: Negative.   Musculoskeletal: Negative.   Skin: Negative.   Allergic/Immunologic: Negative.   Neurological: Negative.   Hematological: Negative.   Psychiatric/Behavioral: Negative.   All other systems reviewed and are negative.    Objective:    Vitals:   11/10/19 1142  BP: 136/80  Pulse: 83  Resp: 14  Temp: 98.9 F (37.2 C)  SpO2: 93%  Weight: 280 lb 12.8 oz (127.4 kg)  Height: 5\' 4"  (1.626 m)    Body mass index is 48.2 kg/m.  Physical Exam Vitals and nursing note reviewed.  Constitutional:      General: She is not in acute distress.    Appearance: Normal appearance. She is well-developed. She is obese. She is not ill-appearing, toxic-appearing or diaphoretic.     Interventions: Face mask in place.  HENT:     Head: Normocephalic and atraumatic.     Right Ear: External ear normal.     Left Ear: External ear normal.  Eyes:     General: Lids are normal. No scleral icterus.       Right eye: No discharge.        Left eye: No discharge.     Conjunctiva/sclera: Conjunctivae normal.  Neck:     Trachea: Phonation normal. No tracheal deviation.  Cardiovascular:     Rate and Rhythm: Normal  rate and regular rhythm.     Pulses: Normal pulses.          Radial pulses are 2+ on the right side and 2+ on the left side.       Posterior tibial pulses are 2+ on the right side and 2+ on the left side.     Heart sounds: Normal heart sounds. No murmur. No friction rub. No gallop.   Pulmonary:     Effort: Pulmonary effort is normal. No respiratory distress.     Breath sounds: Normal breath sounds. No stridor. No wheezing, rhonchi or rales.  Chest:     Chest wall: No tenderness.  Abdominal:     General: Bowel sounds are normal. There is no distension.     Palpations: Abdomen is soft.     Tenderness: There is no abdominal tenderness. There is no guarding or rebound.  Musculoskeletal:        General: No deformity. Normal range of motion.     Cervical back: Normal range of motion and neck supple.     Right lower leg: No edema.     Left lower leg: No edema.  Lymphadenopathy:     Cervical: No cervical adenopathy.  Skin:    General: Skin is warm and dry.     Capillary Refill: Capillary refill takes less than 2 seconds.     Coloration: Skin is not jaundiced or pale.     Findings: No rash.  Neurological:     Mental Status: She is alert and oriented to person, place, and time.     Motor: No abnormal muscle tone.     Gait: Gait normal.  Psychiatric:        Speech: Speech normal.        Behavior: Behavior normal.       PHQ2/9: Depression screen [facility] 2/9 11/10/2019 07/08/2019 06/17/2019 12/23/2018 09/23/2018  Decreased Interest 0 0 1 0 0  Down, Depressed, Hopeless 1 2 1  0 1  PHQ - 2 Score 1 2 2  0 1  Altered sleeping 3 1 0 0 2  Tired, decreased energy 1 1 1  0 1  Change in appetite 0 0 1 0 2  Feeling bad or failure about yourself  0 0 0 0 0  Trouble concentrating 0 0 0 0 0  Moving slowly or fidgety/restless 0 0 0 0 0  Suicidal thoughts 0 0 0 0 0  PHQ-9 Score 5 4 4  0 6  Difficult doing work/chores Not difficult at all Somewhat difficult Somewhat difficult Not difficult at all Not  difficult at all  Some recent data might be hidden    phq 9 is neg reviewed today  Fall Risk: Fall Risk  11/10/2019 07/08/2019 06/17/2019 12/23/2018 09/23/2018  Falls in the past year? 0 1 1 1 1   Number falls in past yr: 0 0 1 0 0  Injury with Fall? 0 1 1 0 0  Risk Factor Category  - - - - -  Risk for fall due to : - - - - -  Risk for fall due to: Comment - - - - -  Follow up - - - - -    Functional Status Survey: Is the patient deaf or have difficulty hearing?: Yes Does the patient have difficulty seeing, even when wearing glasses/contacts?: Yes Does the patient have difficulty concentrating, remembering, or making decisions?: No Does the patient have difficulty walking or climbing stairs?: No Does the patient have difficulty dressing or bathing?: No Does the patient have difficulty doing errands alone such as visiting a doctor's office or shopping?: No   Assessment & Plan:   1. Essential hypertension Well controlled, stable - amLODipine (NORVASC) 10 MG tablet; Take 1 tablet (10 mg total) by mouth daily.  Dispense: 90 tablet; Refill: 3 - COMPLETE METABOLIC PANEL WITH GFR  2.  Encounter for screening mammogram for breast cancer - MM 3D SCREEN BREAST BILATERAL; Future  3. CKD (chronic kidney disease) stage 2, GFR 60-89 ml/min - COMPLETE METABOLIC PANEL WITH GFR  4. Mixed hyperlipidemia Compliant with meds, due for labs - COMPLETE METABOLIC PANEL WITH GFR - Lipid panel  5. Morbid obesity ([place]) Encouraged healthy diet and exercise and physical activity as tolerated  6. Osteoporosis without current pathological fracture, unspecified osteoporosis type On Fosamax, due for repeat dexa  - DG Bone Density - COMPLETE METABOLIC PANEL WITH GFR - VITAMIN D 25 Hydroxy (Vit-D Deficiency, Fractures)  7. Mixed hyperlipidemia Compliant with meds, recheck labs - COMPLETE METABOLIC PANEL WITH GFR - Lipid panel  8. Vitamin D deficiency - VITAMIN D 25 Hydroxy (Vit-D Deficiency,  Fractures)  9. Encounter for medication monitoring - COMPLETE METABOLIC PANEL WITH GFR - Lipid panel - VITAMIN D 25 Hydroxy (Vit-D Deficiency, Fractures) - CBC with Differential/Platelet   Return in about 6 months (around 05/09/2020) for Routine follow-up.   Delsa Grana, PA-C 11/10/19 11:59 AM

## 2019-11-11 LAB — COMPLETE METABOLIC PANEL WITH GFR
AG Ratio: 1.6 (calc) (ref 1.0–2.5)
ALT: 13 U/L (ref 6–29)
AST: 15 U/L (ref 10–35)
Albumin: 3.9 g/dL (ref 3.6–5.1)
Alkaline phosphatase (APISO): 71 U/L (ref 37–153)
BUN/Creatinine Ratio: 26 (calc) — ABNORMAL HIGH (ref 6–22)
BUN: 27 mg/dL — ABNORMAL HIGH (ref 7–25)
CO2: 28 mmol/L (ref 20–32)
Calcium: 9.6 mg/dL (ref 8.6–10.4)
Chloride: 105 mmol/L (ref 98–110)
Creat: 1.02 mg/dL — ABNORMAL HIGH (ref 0.60–0.93)
GFR, Est African American: 64 mL/min/{1.73_m2} (ref >=60)
GFR, Est Non African American: 55 mL/min/{1.73_m2} — ABNORMAL LOW (ref >=60)
Globulin: 2.4 g/dL (calc) (ref 1.9–3.7)
Glucose, Bld: 150 mg/dL — ABNORMAL HIGH (ref 65–99)
Potassium: 4.2 mmol/L (ref 3.5–5.3)
Sodium: 142 mmol/L (ref 135–146)
Total Bilirubin: 0.5 mg/dL (ref 0.2–1.2)
Total Protein: 6.3 g/dL (ref 6.1–8.1)

## 2019-11-11 LAB — VITAMIN D 25 HYDROXY (VIT D DEFICIENCY, FRACTURES): Vit D, 25-Hydroxy: 49 ng/mL (ref 30–100)

## 2019-11-11 LAB — CBC WITH DIFFERENTIAL/PLATELET
Absolute Monocytes: 576 cells/uL (ref 200–950)
Basophils Absolute: 54 cells/uL (ref 0–200)
Basophils Relative: 0.6 %
Eosinophils Absolute: 306 cells/uL (ref 15–500)
Eosinophils Relative: 3.4 %
HCT: 40.3 % (ref 35.0–45.0)
Hemoglobin: 12.8 g/dL (ref 11.7–15.5)
Lymphs Abs: 1656 cells/uL (ref 850–3900)
MCH: 25.3 pg — ABNORMAL LOW (ref 27.0–33.0)
MCHC: 31.8 g/dL — ABNORMAL LOW (ref 32.0–36.0)
MCV: 79.8 fL — ABNORMAL LOW (ref 80.0–100.0)
MPV: 12.1 fL (ref 7.5–12.5)
Monocytes Relative: 6.4 %
Neutro Abs: 6408 cells/uL (ref 1500–7800)
Neutrophils Relative %: 71.2 %
Platelets: 205 10*3/uL (ref 140–400)
RBC: 5.05 10*6/uL (ref 3.80–5.10)
RDW: 16.9 % — ABNORMAL HIGH (ref 11.0–15.0)
Total Lymphocyte: 18.4 %
WBC: 9 10*3/uL (ref 3.8–10.8)

## 2019-11-11 LAB — LIPID PANEL
Cholesterol: 135 mg/dL (ref <200)
HDL: 42 mg/dL — ABNORMAL LOW (ref >=50)
LDL Cholesterol (Calc): 75 mg/dL (calc)
Non-HDL Cholesterol (Calc): 93 mg/dL (calc) (ref <130)
Total CHOL/HDL Ratio: 3.2 (calc) (ref <5.0)
Triglycerides: 98 mg/dL (ref <150)

## 2020-01-18 ENCOUNTER — Other Ambulatory Visit: Payer: Self-pay | Admitting: Family Medicine

## 2020-02-02 ENCOUNTER — Ambulatory Visit: Payer: Medicare Other

## 2020-02-03 ENCOUNTER — Other Ambulatory Visit: Payer: Self-pay | Admitting: Family Medicine

## 2020-02-04 ENCOUNTER — Ambulatory Visit: Payer: Medicare Other

## 2020-05-09 ENCOUNTER — Ambulatory Visit: Payer: Medicare Other | Admitting: Family Medicine

## 2020-05-11 ENCOUNTER — Telehealth: Payer: Self-pay | Admitting: Family Medicine

## 2020-05-11 NOTE — Telephone Encounter (Signed)
Copied from [place] [id number]. Topic: Medicare AWV >> May 11, 2020  3:08 PM Cher Nakai R wrote: Reason for CRM: Left message for patient to call back and schedule the Medicare Annual Wellness Visit (AWV) in office or virtual  Last AWV 06/20/2018  Please schedule at anytime with [place].  40 minute appointment

## 2020-05-13 ENCOUNTER — Other Ambulatory Visit: Payer: Self-pay

## 2020-05-13 MED ORDER — ALENDRONATE SODIUM 70 MG PO TABS: 70.0000 mg | ORAL_TABLET | ORAL | 3 refills | Status: AC

## 2020-05-17 ENCOUNTER — Other Ambulatory Visit: Payer: Self-pay

## 2020-05-17 MED ORDER — LISINOPRIL 40 MG PO TABS: 40.0000 mg | ORAL_TABLET | Freq: Every day | ORAL | 3 refills | Status: AC

## 2020-05-20 ENCOUNTER — Other Ambulatory Visit: Payer: Self-pay

## 2020-05-20 ENCOUNTER — Encounter: Payer: Self-pay | Admitting: Family Medicine

## 2020-05-20 ENCOUNTER — Ambulatory Visit (INDEPENDENT_AMBULATORY_CARE_PROVIDER_SITE_OTHER): Payer: Medicare Other | Admitting: Family Medicine

## 2020-05-20 VITALS — BP 134/78 | HR 98 | Temp 97.8°F | Resp 16 | Ht 64.0 in | Wt 271.1 lb

## 2020-05-20 DIAGNOSIS — M7989 Other specified soft tissue disorders: Secondary | ICD-10-CM | POA: Diagnosis not present

## 2020-05-20 DIAGNOSIS — I10 Essential (primary) hypertension: Secondary | ICD-10-CM | POA: Diagnosis not present

## 2020-05-20 DIAGNOSIS — R609 Edema, unspecified: Secondary | ICD-10-CM

## 2020-05-20 DIAGNOSIS — M79646 Pain in unspecified finger(s): Secondary | ICD-10-CM

## 2020-05-20 DIAGNOSIS — M79642 Pain in left hand: Secondary | ICD-10-CM

## 2020-05-20 DIAGNOSIS — M79643 Pain in unspecified hand: Secondary | ICD-10-CM

## 2020-05-20 DIAGNOSIS — E113513 Type 2 diabetes mellitus with proliferative diabetic retinopathy with macular edema, bilateral: Secondary | ICD-10-CM

## 2020-05-20 DIAGNOSIS — M79641 Pain in right hand: Secondary | ICD-10-CM | POA: Diagnosis not present

## 2020-05-20 DIAGNOSIS — Z5181 Encounter for therapeutic drug level monitoring: Secondary | ICD-10-CM

## 2020-05-20 DIAGNOSIS — E782 Mixed hyperlipidemia: Secondary | ICD-10-CM

## 2020-05-20 MED ORDER — ATORVASTATIN CALCIUM 40 MG PO TABS: 20.0000 mg | ORAL_TABLET | Freq: Every day | ORAL | 3 refills | Status: AC

## 2020-05-20 NOTE — Patient Instructions (Signed)
You can take tylenol extra strength or tylenol arthritis [phone] mg up to three times a day for pain and swelling.

## 2020-05-20 NOTE — Progress Notes (Signed)
Name: Julie [name]   MRN: [id number]    DOB: 1946/01/04   Date:05/20/2020       Progress Note  Chief Complaint  Patient presents with   Hyperlipidemia    follow up   Hypertension     Subjective:   Julie [name] is a 74 y.o. female, presents to clinic for routine f/up, has several new acute complaints as well   Hypertension:  Currently managed on lisinopril 40, norvasc 10, HCTZ 25 and Dr. Holley Raring added carvedilol 6.25 BID Pt reports good med compliance and denies any SE.  No lightheadedness, hypotension, syncope. Blood pressure today is well controlled today in office, though she reports high BP at endocrine office and nephro ~ 158/80 Better today, she doesn't notice any sx and she has no concern  BP Readings from Last 3 Encounters:  05/20/20 134/78  11/10/19 136/80  07/08/19 (!) 142/62   Pt denies CP, SOB, exertional sx, LE edema, palpitation, Ha's, visual disturbances Dietary efforts for BP? Tries to watch salt - noticed changes to swelling when she is not as careful  HLD - still tolerating lipitor 40 mg and omega-3 fatty acid supplement denies myalgias or fatigue Lab Results  Component Value Date   CHOL 135 11/10/2019   HDL 42 (L) 11/10/2019   LDLCALC 75 11/10/2019   TRIG 98 11/10/2019   CHOLHDL 3.2 11/10/2019  She denies exertional chest pain, shortness of breath, claudication symptoms   She is concerned about diffuse swelling- she has had swelling in her legs and ankles before but she has noted swelling to her hands and fingers with associated finger hand and wrist pain.  She does have history of back pain and she has other chronic history of lower extremity edema and varicose veins to legs but she notes she has pain in her hands and fingers swelling, feels that they are weak and stiff.   She denies any redness or warmth to her joints denies history of gout.  No history of hypothyroid  Diabetes per [name] endocrinology-reviewed care everywhere-last A1c  was near goal 02/02/2020 A1c was 7.1 -managed with  Osteoporosis -per endocrinology on Fosamax and vitamin D supplement   Current Outpatient Medications:    alendronate (FOSAMAX) 70 MG tablet, Take 1 tablet (70 mg total) by mouth once a week., Disp: 12 tablet, Rfl: 3   amLODipine (NORVASC) 10 MG tablet, Take 1 tablet (10 mg total) by mouth daily., Disp: 90 tablet, Rfl: 3   aspirin 81 MG tablet, Take 81 mg by mouth daily., Disp: , Rfl:    atorvastatin (LIPITOR) 40 MG tablet, Take 0.5 tablets (20 mg total) by mouth at bedtime., Disp: 90 tablet, Rfl: 1   bevacizumab (AVASTIN) 1.25 mg/0.1 mL SOLN, 10 mg by Intravitreal route to Surgery., Disp: , Rfl:    carvedilol (COREG) 6.25 MG tablet, Take by mouth., Disp: , Rfl:    Cholecalciferol (VITAMIN D-1000 MAX ST) 1000 UNITS tablet, Take 1,000 Units by mouth daily., Disp: , Rfl:    glipiZIDE (GLUCOTROL XL) 10 MG 24 hr tablet, TAKE 1 TABLET BY MOUTH  TWICE A DAY, Disp: 180 tablet, Rfl: 1   hydrochlorothiazide (HYDRODIURIL) 25 MG tablet, TAKE 1 TABLET BY MOUTH  DAILY, Disp: 90 tablet, Rfl: 1   lisinopril (ZESTRIL) 40 MG tablet, Take 1 tablet (40 mg total) by mouth daily., Disp: 90 tablet, Rfl: 3   metFORMIN (GLUCOPHAGE) 500 MG tablet, TAKE 2 TABLETS BY MOUTH TWO TIMES DAILY, Disp: 360 tablet, Rfl: 1  Multiple Vitamin (MULTI-VITAMINS) TABS, Take by mouth daily. , Disp: , Rfl:    Omega-3 Fatty Acids (FISH OIL) 1000 MG CAPS, Take 1,000 mg by mouth 2 (two) times daily., Disp: , Rfl:   Current Facility-Administered Medications:    triamcinolone acetonide (KENALOG) 10 MG/ML injection 10 mg, 10 mg, Other, Once, [name], DPM  Patient Active Problem List   Diagnosis Date Noted   Bilateral lower extremity edema 07/08/2019   Stool guaiac positive 06/03/2017   Dermatitis 01/04/2017   Low back pain 06/06/2016   Medication monitoring encounter 11/30/2015   Varicose vein of leg 11/30/2015   Abnormal mammogram 08/04/2015   Cardiac  murmur 07/28/2015   Hypertension    Hyperlipidemia    Diabetes mellitus with proliferative retinopathy ([place])    CKD (chronic kidney disease) stage 2, GFR 60-89 ml/min    Morbid obesity ([place])    Osteoporosis    Proteinuria 10/15/2013    Past Surgical History:  Procedure Laterality Date   COLONOSCOPY WITH PROPOFOL N/A 07/16/2017   Procedure: COLONOSCOPY WITH PROPOFOL;  Surgeon: Robert Bellow, MD;  Location: [facility];  Service: Endoscopy;  Laterality: N/A;   [place] OF UTERUS  2007   ESOPHAGOGASTRODUODENOSCOPY (EGD) WITH PROPOFOL N/A 07/16/2017   Procedure: ESOPHAGOGASTRODUODENOSCOPY (EGD) WITH PROPOFOL;  Surgeon: Robert Bellow, MD;  Location: [facility] ENDOSCOPY;  Service: Endoscopy;  Laterality: N/A;   MOUTH SURGERY  2007   cyst   TONSILLECTOMY      Family History  Problem Relation Age of Onset   Glaucoma Mother    Cancer Father        liver   Cancer Sister        lung, bone   COPD Sister    Heart disease Maternal Grandmother        chf   Hypertension Brother    Hypertension Maternal Uncle    Ovarian cancer Paternal Grandmother    Cancer Paternal Grandmother        ovarian cancer   Breast cancer Neg Hx     Social History   Tobacco Use   Smoking status: Never Smoker   Smokeless tobacco: Never Used   Tobacco comment: smoking cessation materials not required  Vaping Use   Vaping Use: Never used  Substance Use Topics   Alcohol use: No   Drug use: No     Allergies  Allergen Reactions   Nsaids Other (See Comments)   Penicillins Other (See Comments)    Not sure of reaction     Health Maintenance  Topic Date Due   Hepatitis C Screening  Never done   COVID-19 Vaccine (1) Never done   MAMMOGRAM  02/13/2019   HEMOGLOBIN A1C  11/18/2019   INFLUENZA VACCINE  05/15/2020   FOOT EXAM  07/07/2020   OPHTHALMOLOGY EXAM  09/01/2020   TETANUS/TDAP  07/07/2029   DEXA SCAN  Completed   PNA vac Low Risk  Adult  Completed   Fecal DNA (Cologuard)  Discontinued    Chart Review Today: I personally reviewed active problem list, medication list, allergies, family history, social history, health maintenance, notes from last encounter, lab results, imaging with the patient/caregiver today.   Review of Systems  10 Systems reviewed and are negative for acute change except as noted in the HPI.  Objective:   Vitals:   05/20/20 1000  BP: 134/78  Pulse: 98  Resp: 16  Temp: 97.8 F (36.6 C)  TempSrc: Temporal  SpO2: 93%  Weight: 271 lb  1.6 oz (123 kg)  Height: 5\' 4"  (1.626 m)    Body mass index is 46.53 kg/m.  Physical Exam Vitals and nursing note reviewed.  Constitutional:      General: She is not in acute distress.    Appearance: Normal appearance. She is obese. She is not ill-appearing, toxic-appearing or diaphoretic.  HENT:     Head: Normocephalic and atraumatic.     Right Ear: External ear normal.     Left Ear: External ear normal.  Eyes:     General:        Right eye: No discharge.        Left eye: No discharge.     Conjunctiva/sclera: Conjunctivae normal.  Cardiovascular:     Rate and Rhythm: Normal rate.     Pulses: Normal pulses.     Heart sounds: Normal heart sounds. No murmur heard.  No friction rub. No gallop.   Pulmonary:     Effort: No respiratory distress.     Breath sounds: Normal breath sounds. No wheezing, rhonchi or rales.  Abdominal:     General: Bowel sounds are normal.     Palpations: Abdomen is soft.  Musculoskeletal:        General: Swelling present.     Right lower leg: Edema present.     Left lower leg: Edema present.     Comments: Bilateral wrist hands and fingers with mild diffuse swelling, no bony ttp, no erythema  Skin:    Coloration: Skin is not jaundiced or pale.     Findings: No rash.  Neurological:     Mental Status: She is alert. Mental status is at baseline.     Gait: Gait normal.  Psychiatric:        Mood and Affect: Mood  normal.        Behavior: Behavior normal.         Assessment & Plan:   1. Essential hypertension Blood pressure today is well controlled with Norvasc, lisinopril, hydrochlorothiazide and addition of carvedilol.  She has had some high readings at other office visits but she has been asymptomatic and it is well controlled today, patient does not have any concerns or side effects with medications nor has she had any symptoms with slightly elevated blood pressure readings. - COMPLETE METABOLIC PANEL WITH GFR - CBC with Differential/Platelet - carvedilol (COREG) 6.25 MG tablet; Take by mouth. - COMPLETE METABOLIC PANEL WITH GFR  2. Bilateral hand swelling Patient describes her symptoms in such a way that made me suspicious for some possible autoimmune or rheumatic disease contributing towards her swelling stiffness weakness and pain- Rule out hypothyroid, doubt carpal tunnel but it would be in the differential, I explained to her that we could check inflammatory markers today and if positive refer to specialist for further work-up. She does have a history of anemia, renal disease, will recheck her CBC, liver function, kidney function, serum protein.   With onset of her hand swelling she did not have any worsening of her lower extremity edema so I doubt renal etiology/anasarca or diffuse third spacing for any reason  - TSH - T4, free - ANA - C-reactive protein - Sedimentation rate - COMPLETE METABOLIC PANEL WITH GFR - CBC with Differential/Platelet - Anti-nuclear ab-titer (ANA titer)  3. Peripheral edema -doubt CHF rule out thyroid dysfunction, check kidney function liver function electrolytes and blood counts - TSH - T4, free - COMPLETE METABOLIC PANEL WITH GFR - CBC with Differential/Platelet - Anti-nuclear ab-titer (ANA titer)  4. Pain in both hands -the above - TSH - T4, free - ANA - C-reactive protein - Sedimentation rate - COMPLETE METABOLIC PANEL WITH GFR - CBC with  Differential/Platelet - Anti-nuclear ab-titer (ANA titer)  5. Pain of hand and fingers See above - TSH - T4, free - ANA - C-reactive protein - Sedimentation rate - COMPLETE METABOLIC PANEL WITH GFR - CBC with Differential/Platelet - Anti-nuclear ab-titer (ANA titer)  6. Mixed hyperlipidemia Cholesterol has been well controlled with Lipitor and omega-3 fatty acid supplements - atorvastatin (LIPITOR) 40 MG tablet; Take 0.5 tablets (20 mg total) by mouth at bedtime.  Dispense: 45 tablet; Refill: 3  7. Encounter for medication monitoring - COMPLETE METABOLIC PANEL WITH GFR - CBC with Differential/Platelet - COMPLETE METABOLIC PANEL WITH GFR - CBC with Differential/Platelet  8. Type 2 diabetes mellitus with both eyes affected by proliferative retinopathy and macular edema, without long-term current use of insulin (HCC) Has been near goal, patient sees [facility] for management-most recent A1c was only 3 to 4 months ago, was 7.1, foot exam up-to-date -have asked that staff help update gaps in our EMR due to patient receiving management through [place] provider   Return in about 6 months (around 11/20/2020) for Routine follow-up.   Delsa Grana, PA-C 05/20/20 10:29 AM

## 2020-05-24 LAB — CBC WITH DIFFERENTIAL/PLATELET
Absolute Monocytes: 548 cells/uL (ref 200–950)
Basophils Absolute: 40 cells/uL (ref 0–200)
Basophils Relative: 0.6 %
Eosinophils Absolute: 238 cells/uL (ref 15–500)
Eosinophils Relative: 3.6 %
HCT: 36.8 % (ref 35.0–45.0)
Hemoglobin: 12 g/dL (ref 11.7–15.5)
Lymphs Abs: 1175 cells/uL (ref 850–3900)
MCH: 26.2 pg — ABNORMAL LOW (ref 27.0–33.0)
MCHC: 32.6 g/dL (ref 32.0–36.0)
MCV: 80.3 fL (ref 80.0–100.0)
MPV: 11.9 fL (ref 7.5–12.5)
Monocytes Relative: 8.3 %
Neutro Abs: 4600 cells/uL (ref 1500–7800)
Neutrophils Relative %: 69.7 %
Platelets: 181 10*3/uL (ref 140–400)
RBC: 4.58 10*6/uL (ref 3.80–5.10)
RDW: 15.8 % — ABNORMAL HIGH (ref 11.0–15.0)
Total Lymphocyte: 17.8 %
WBC: 6.6 10*3/uL (ref 3.8–10.8)

## 2020-05-24 LAB — COMPLETE METABOLIC PANEL WITH GFR
AG Ratio: 1.5 (calc) (ref 1.0–2.5)
ALT: 14 U/L (ref 6–29)
AST: 17 U/L (ref 10–35)
Albumin: 3.8 g/dL (ref 3.6–5.1)
Alkaline phosphatase (APISO): 69 U/L (ref 37–153)
BUN/Creatinine Ratio: 24 (calc) — ABNORMAL HIGH (ref 6–22)
BUN: 28 mg/dL — ABNORMAL HIGH (ref 7–25)
CO2: 25 mmol/L (ref 20–32)
Calcium: 9.1 mg/dL (ref 8.6–10.4)
Chloride: 106 mmol/L (ref 98–110)
Creat: 1.19 mg/dL — ABNORMAL HIGH (ref 0.60–0.93)
GFR, Est African American: 53 mL/min/{1.73_m2} — ABNORMAL LOW (ref >=60)
GFR, Est Non African American: 46 mL/min/{1.73_m2} — ABNORMAL LOW (ref >=60)
Globulin: 2.6 g/dL (calc) (ref 1.9–3.7)
Glucose, Bld: 130 mg/dL — ABNORMAL HIGH (ref 65–99)
Potassium: 4.1 mmol/L (ref 3.5–5.3)
Sodium: 143 mmol/L (ref 135–146)
Total Bilirubin: 0.4 mg/dL (ref 0.2–1.2)
Total Protein: 6.4 g/dL (ref 6.1–8.1)

## 2020-05-24 LAB — ANTI-NUCLEAR AB-TITER (ANA TITER)
ANA TITER: 1:40 {titer} — ABNORMAL HIGH
ANA Titer 1: 1:40 {titer} — ABNORMAL HIGH

## 2020-05-24 LAB — C-REACTIVE PROTEIN: CRP: 34.8 mg/L — ABNORMAL HIGH (ref <8.0)

## 2020-05-24 LAB — TSH: TSH: 3.58 mIU/L (ref 0.40–4.50)

## 2020-05-24 LAB — SEDIMENTATION RATE: Sed Rate: 51 mm/h — ABNORMAL HIGH (ref 0–30)

## 2020-05-24 LAB — ANA: Anti Nuclear Antibody (ANA): POSITIVE — AB

## 2020-05-24 LAB — T4, FREE: Free T4: 1.2 ng/dL (ref 0.8–1.8)

## 2020-05-26 ENCOUNTER — Other Ambulatory Visit: Payer: Self-pay | Admitting: Family Medicine

## 2020-05-26 ENCOUNTER — Encounter: Payer: Self-pay | Admitting: Family Medicine

## 2020-05-26 ENCOUNTER — Telehealth: Payer: Self-pay

## 2020-05-26 DIAGNOSIS — M79641 Pain in right hand: Secondary | ICD-10-CM

## 2020-05-26 DIAGNOSIS — M7989 Other specified soft tissue disorders: Secondary | ICD-10-CM

## 2020-05-26 NOTE — Telephone Encounter (Signed)
-----   Message from Delsa Grana, [place] sent at 05/26/2020  1:48 PM EDT ----- Roselyn Reef - please close A1C gap with last A1C done with Jefm Bryant  Please notify pt of labwork - please remind her to schedule her mammogram  She had multiple labs positive that are markers for inflammation and for possible autoimmune disease or arthritis - Please refer to rheumatology for dx polyarthralgias, pain in hands and fingers, positive ANA, elevated CRP and elevated Sed rate.    Her kidney function, blood counts, thyroid, electrolytes - everything else is stable

## 2020-06-14 ENCOUNTER — Encounter: Payer: Self-pay | Admitting: Family Medicine

## 2020-06-19 ENCOUNTER — Other Ambulatory Visit: Payer: Self-pay | Admitting: Family Medicine

## 2020-06-26 IMAGING — US US EXTREM LOW VENOUS*L*
1 series · 13 of 24 positions shown · non-contrast
Comparison: None.

CLINICAL DATA: LEFT LOWER extremity swelling for months.



[Series 1: us extrem low venous*left* · 13 of 36 slices shown]
[im 1/36]
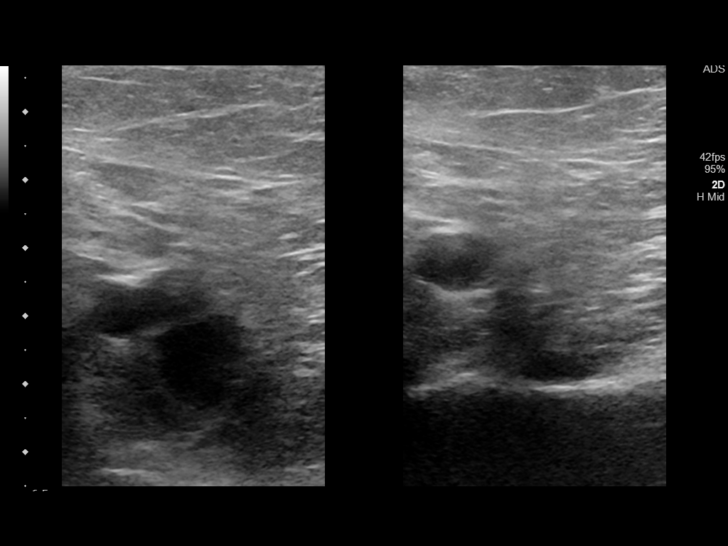
[im 4/36]
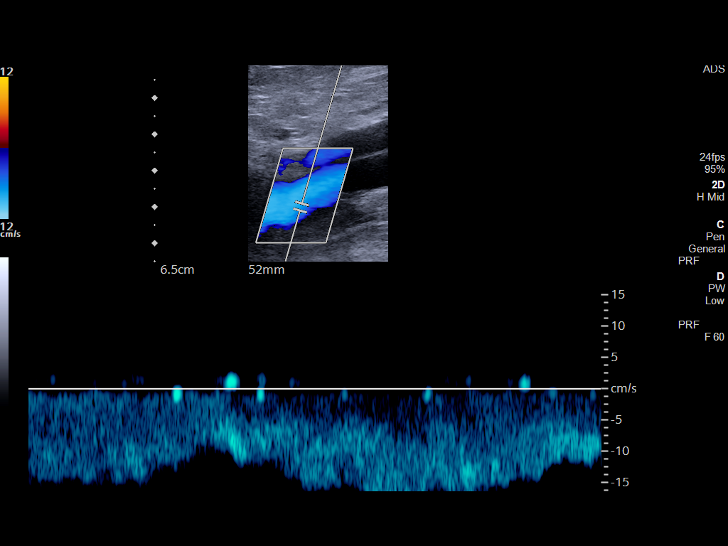
[im 7/36]
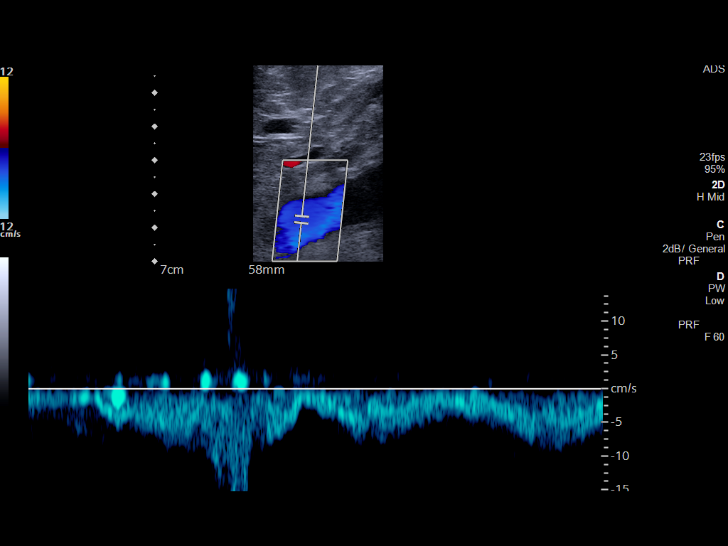
[im 10/36]
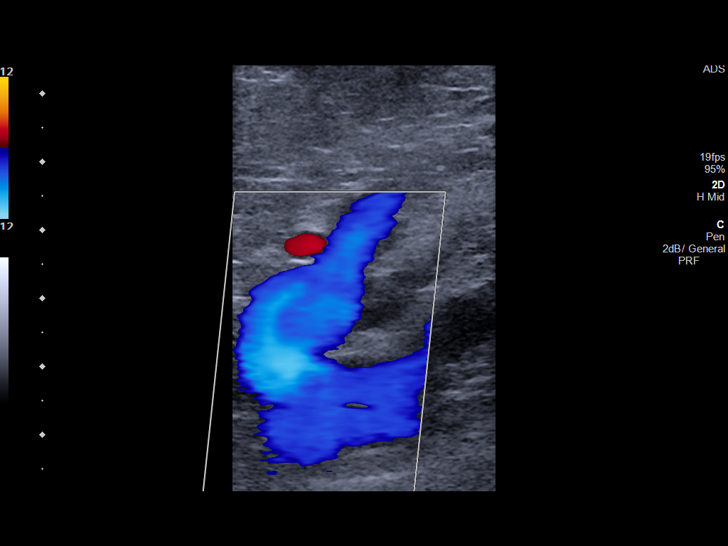
[im 13/36]
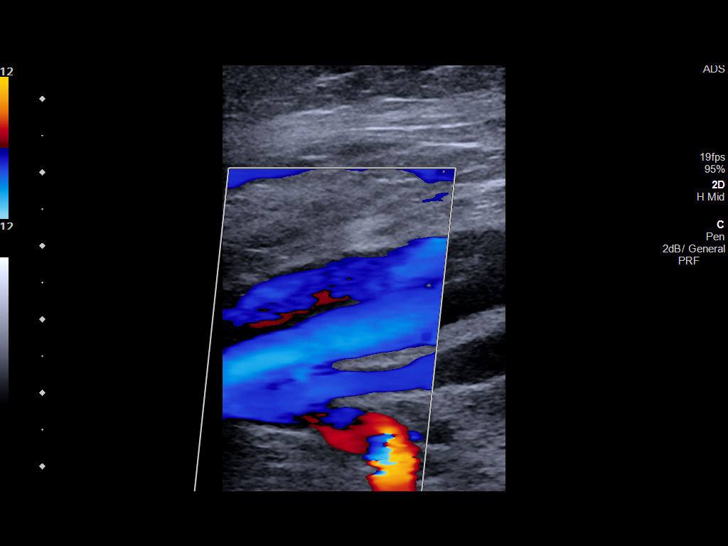
[im 16/36]
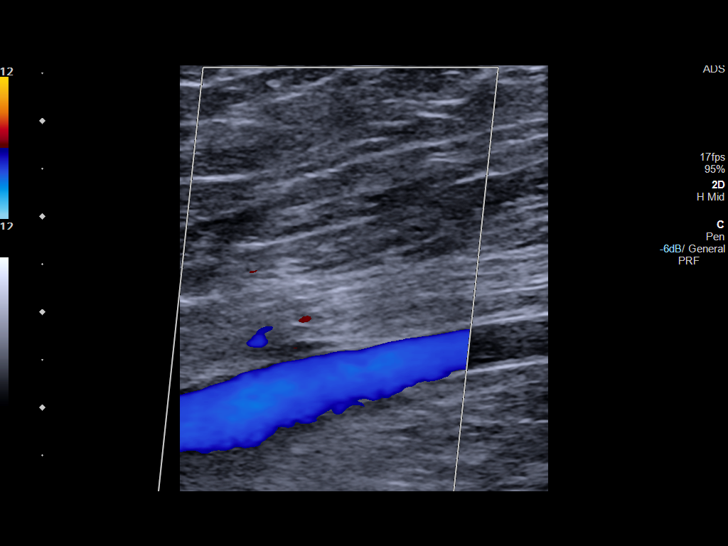
[im 19/36]
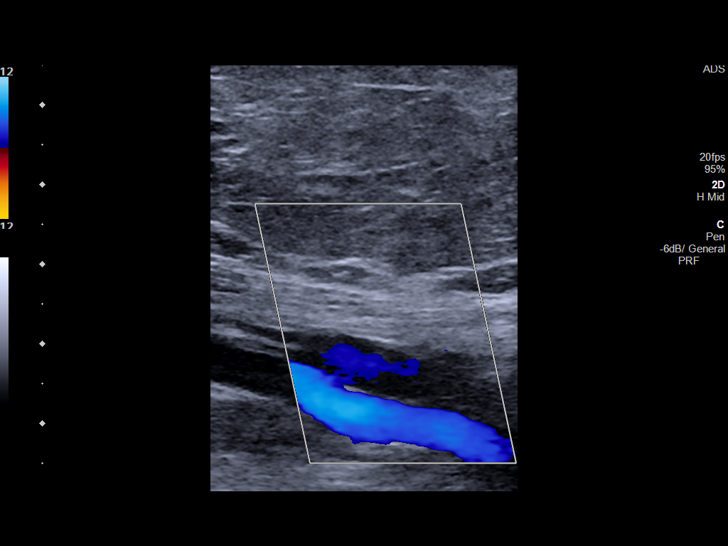
[im 20/36]
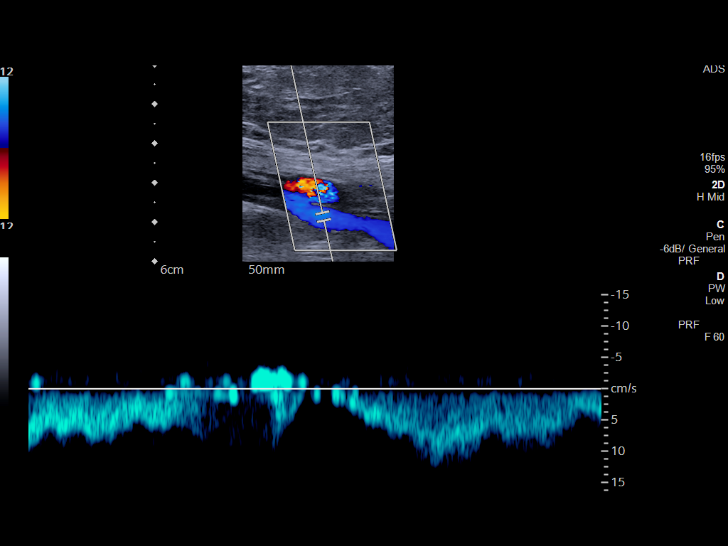
[im 23/36]
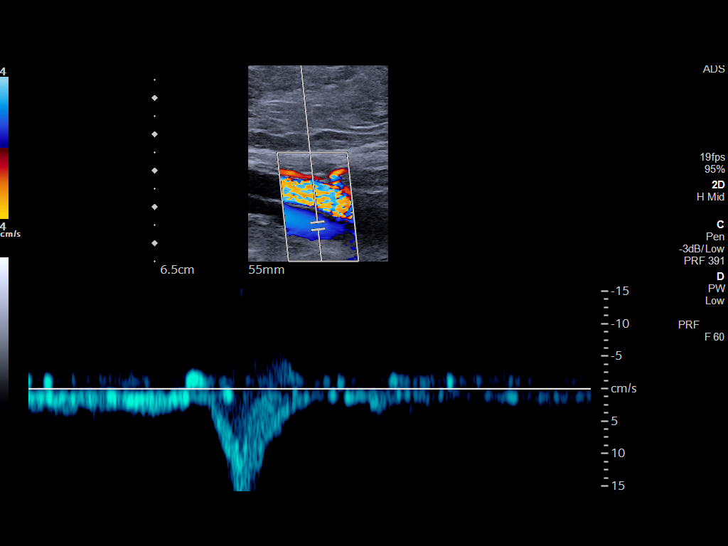
[im 26/36]
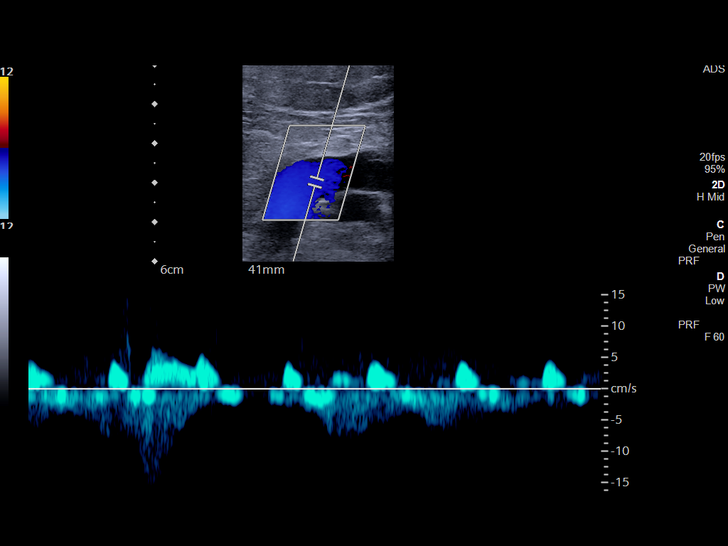
[im 29/36]
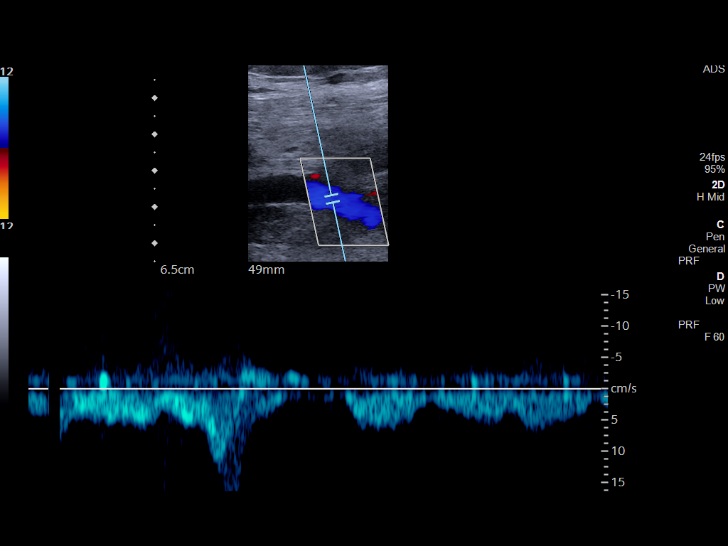
[im 32/36]
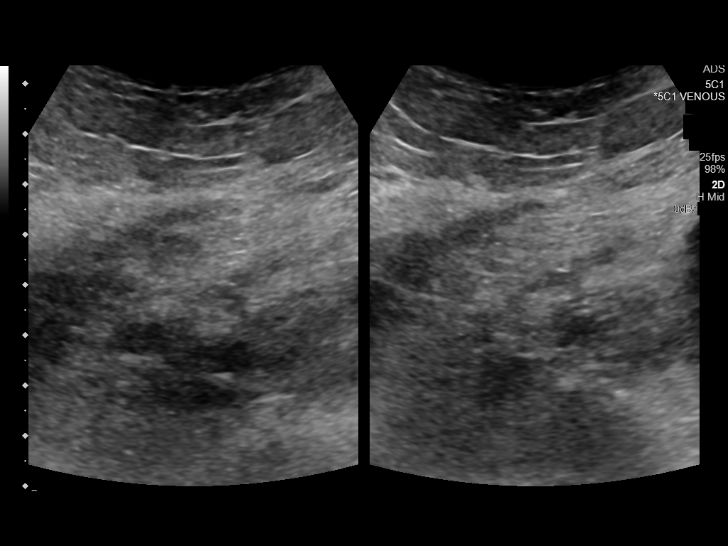
[im 36/36]
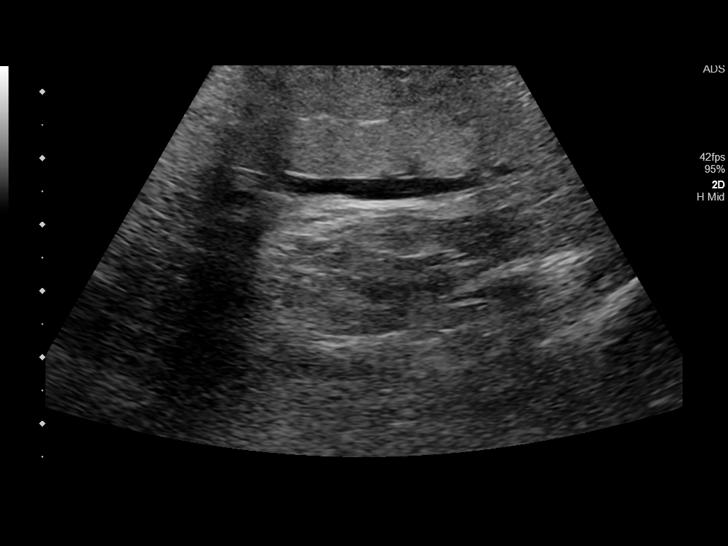

[13 of 24 positions shown; findings below may reference images not displayed]

FINDINGS: Contralateral Common Femoral Vein: Respiratory phasicity is normal
and symmetric with the symptomatic side. No evidence of thrombus.
Normal compressibility.

Common Femoral Vein: No evidence of thrombus. Normal
compressibility, respiratory phasicity and response to augmentation.

Saphenofemoral Junction: No evidence of thrombus. Normal
compressibility and flow on color Doppler imaging.

Profunda Femoral Vein: No evidence of thrombus. Normal
compressibility and flow on color Doppler imaging.

Femoral Vein: No evidence of thrombus. Normal compressibility,
respiratory phasicity and response to augmentation.

Popliteal Vein: No evidence of thrombus. Normal compressibility,
respiratory phasicity and response to augmentation.

Calf Veins: Limited evaluation of the calf veins secondary to LOWER
extremity edema and patient body habitus.

Superficial Great Saphenous Vein: No evidence of thrombus. Normal
compressibility.

Venous Reflux:  None.

Other Findings:  None.
IMPRESSION: No evidence of deep venous thrombosis.

LOWER extremity edema.

## 2020-07-11 LAB — HM DIABETES EYE EXAM

## 2020-07-14 ENCOUNTER — Ambulatory Visit: Payer: Medicare Other

## 2020-08-10 ENCOUNTER — Ambulatory Visit (INDEPENDENT_AMBULATORY_CARE_PROVIDER_SITE_OTHER): Payer: Medicare Other | Admitting: Internal Medicine

## 2020-08-10 ENCOUNTER — Encounter: Payer: Self-pay | Admitting: Internal Medicine

## 2020-08-10 ENCOUNTER — Other Ambulatory Visit: Payer: Self-pay

## 2020-08-10 DIAGNOSIS — N182 Chronic kidney disease, stage 2 (mild): Secondary | ICD-10-CM

## 2020-08-10 DIAGNOSIS — E113513 Type 2 diabetes mellitus with proliferative diabetic retinopathy with macular edema, bilateral: Secondary | ICD-10-CM | POA: Diagnosis not present

## 2020-08-10 DIAGNOSIS — I10 Essential (primary) hypertension: Secondary | ICD-10-CM

## 2020-08-10 DIAGNOSIS — J069 Acute upper respiratory infection, unspecified: Secondary | ICD-10-CM

## 2020-08-10 NOTE — Progress Notes (Signed)
Name: Julie [name]   MRN: [id number]    DOB: 1945-11-26   Date:08/10/2020       Progress Note  Subjective  Chief Complaint  Chief Complaint  Patient presents with  . Cough  . Chest Congestion    I connected with  Harrison Mons on 08/10/20 at  9:40 AM EDT by telephone and verified that I am speaking with the correct person using two identifiers.  I discussed the limitations, risks, security and privacy concerns of performing an evaluation and management service by telephone and the availability of in person appointments. The patient expressed understanding and agreed to proceed. Staff also discussed with the patient that there may be a patient responsible charge related to this service. Patient Location: Home Provider Location: [facility] Additional Individuals present: none  HPI  Patient is a 74 year old female patient of Delsa Grana Last visit with her was 05/20/2020 She is also followed by endocrinology and nephrology for diabetes, osteoporosis, and chronic kidney disease.  Also with hypertension and hyperlipidemia. She noted her A1c's have been in the high sixes to very low sevens in the last couple years. Follows up today with cough and chest congestion. Stated feel okay except for these cold symptoms. Had Covid vaccine, last one in March No recent obvious exposure concerns noted.  Sx's started 4-5 days ago. Stated are cold sx's.  + cough, min production at times, mucus clear No marked SOB No fever, tried with thermometer and not working, not feeling feverish + sore throat the last 3 days + mild congestion, + runny nose No loss of smell, loss of taste No N/V No muscle aches No marked loose stools/diarrhea No CP, passing out episodes  Started taking Mucinex, Nyquil for 2 nights Never smoker Comorbid conditions reviewed No asthma/COPD hx,  + h/o DM, HTN,  CKD + obesity Wt Readings from Last 3 Encounters:  05/20/20 271 lb 1.6 oz (123 kg)  11/10/19 280 lb 12.8 oz  (127.4 kg)  07/08/19 285 lb 14.4 oz (129.7 kg)          Patient Active Problem List   Diagnosis Date Noted  . Other and unspecified hyperlipidemia 09/29/2019  . Benign essential hypertension 09/29/2019  . Bilateral lower extremity edema 07/08/2019  . Hypertension associated with diabetes ([place]) 07/29/2018  . Type 2 diabetes mellitus with microalbuminuria, without long-term current use of insulin ([place]) 07/29/2018  . Age-related osteoporosis without current pathological fracture 07/29/2018  . Hyperlipidemia associated with type 2 diabetes mellitus ([place]) 07/29/2018  . Stool guaiac positive 06/03/2017  . Dermatitis 01/04/2017  . Low back pain 06/06/2016  . Medication monitoring encounter 11/30/2015  . Varicose vein of leg 11/30/2015  . Abnormal mammogram 08/04/2015  . Cardiac murmur 07/28/2015  . CKD (chronic kidney disease) stage 2, GFR 60-89 ml/min   . Morbid obesity ([place])   . Osteoporosis   . Proteinuria 10/15/2013    Past Surgical History:  Procedure Laterality Date  . COLONOSCOPY WITH PROPOFOL N/A 07/16/2017   Procedure: COLONOSCOPY WITH PROPOFOL;  Surgeon: Robert Bellow, MD;  Location: [facility] ENDOSCOPY;  Service: Endoscopy;  Laterality: N/A;  . DILATION AND CURETTAGE OF UTERUS  2007  . ESOPHAGOGASTRODUODENOSCOPY (EGD) WITH PROPOFOL N/A 07/16/2017   Procedure: ESOPHAGOGASTRODUODENOSCOPY (EGD) WITH PROPOFOL;  Surgeon: Robert Bellow, MD;  Location: [facility] ENDOSCOPY;  Service: Endoscopy;  Laterality: N/A;  . MOUTH SURGERY  2007   cyst  . TONSILLECTOMY      Family History  Problem Relation Age of Onset  .  Glaucoma Mother   . Cancer Father        liver  . Cancer Sister        lung, bone  . COPD Sister   . Heart disease Maternal Grandmother        chf  . Hypertension Brother   . Hypertension Maternal Uncle   . Ovarian cancer Paternal Grandmother   . Cancer Paternal Grandmother        ovarian cancer  . Breast cancer Neg Hx     Social History   Tobacco Use    . Smoking status: Never Smoker  . Smokeless tobacco: Never Used  . Tobacco comment: smoking cessation materials not required  Substance Use Topics  . Alcohol use: No     Current Outpatient Medications:  .  alendronate (FOSAMAX) 70 MG tablet, Take 1 tablet (70 mg total) by mouth once a week., Disp: 12 tablet, Rfl: 3 .  amLODipine (NORVASC) 10 MG tablet, Take 1 tablet (10 mg total) by mouth daily., Disp: 90 tablet, Rfl: 3 .  aspirin 81 MG tablet, Take 81 mg by mouth daily., Disp: , Rfl:  .  atorvastatin (LIPITOR) 40 MG tablet, Take 0.5 tablets (20 mg total) by mouth at bedtime., Disp: 45 tablet, Rfl: 3 .  bevacizumab (AVASTIN) 1.25 mg/0.1 mL SOLN, 10 mg by Intravitreal route to Surgery., Disp: , Rfl:  .  carvedilol (COREG) 6.25 MG tablet, Take by mouth., Disp: , Rfl:  .  Cholecalciferol (VITAMIN D-1000 MAX ST) 1000 UNITS tablet, Take 1,000 Units by mouth daily., Disp: , Rfl:  .  furosemide (LASIX) 40 MG tablet, Take by mouth., Disp: , Rfl:  .  glipiZIDE (GLUCOTROL XL) 10 MG 24 hr tablet, TAKE 1 TABLET BY MOUTH  TWICE A DAY, Disp: 180 tablet, Rfl: 1 .  hydrochlorothiazide (HYDRODIURIL) 25 MG tablet, TAKE 1 TABLET BY MOUTH  DAILY, Disp: 90 tablet, Rfl: 3 .  lisinopril (ZESTRIL) 40 MG tablet, Take 1 tablet (40 mg total) by mouth daily., Disp: 90 tablet, Rfl: 3 .  metFORMIN (GLUCOPHAGE) 500 MG tablet, TAKE 2 TABLETS BY MOUTH TWO TIMES DAILY, Disp: 360 tablet, Rfl: 1 .  Multiple Vitamin (MULTI-VITAMINS) TABS, Take by mouth daily. , Disp: , Rfl:  .  Omega-3 Fatty Acids (FISH OIL) 1000 MG CAPS, Take 1,000 mg by mouth 2 (two) times daily., Disp: , Rfl:  .  ONETOUCH VERIO test strip, 1 each daily., Disp: , Rfl:  .  Cysteamine Bitartrate (PROCYSBI) 300 MG PACK, Use 1 each 3 (three) times daily Use as instructed. (Patient not taking: Reported on 08/10/2020), Disp: , Rfl:   Current Facility-Administered Medications:  .  triamcinolone acetonide (KENALOG) 10 MG/ML injection 10 mg, 10 mg, Other, Once,  Regal, Tamala Fothergill, DPM  Allergies  Allergen Reactions  . Nsaids Other (See Comments)  . Penicillins Other (See Comments)    Not sure of reaction     With staff assistance, above reviewed with the patient today.  ROS: As per HPI, otherwise no specific complaints on a limited and focused system review   Objective  Virtual encounter, vitals not obtained.  There is no height or weight on file to calculate BMI.  Physical Exam   Appears in NAD via conversation, pleasant, no coughing spells during our conversation Breathing: No obvious respiratory distress. Speaking in complete sentences Neurological: Pt is alert, Speech is normal Psychiatric: Patient has a normal mood and affect, behavior is normal. Judgment and thought content normal.   No results found for  this or any previous visit (from the past 72 hour(s)).  PHQ2/9: Depression screen [facility] 2/9 08/10/2020 05/20/2020 11/10/2019 07/08/2019 06/17/2019  Decreased Interest 0 1 0 0 1  Down, Depressed, Hopeless 1 1 1 2 1   PHQ - 2 Score 1 2 1 2 2   Altered sleeping - 1 3 1  0  Tired, decreased energy - 1 1 1 1   Change in appetite - 1 0 0 1  Feeling bad or failure about yourself  - 1 0 0 0  Trouble concentrating - 0 0 0 0  Moving slowly or fidgety/restless - 0 0 0 0  Suicidal thoughts - 0 0 0 0  PHQ-9 Score - 6 5 4 4   Difficult doing work/chores - - Not difficult at all Somewhat difficult Somewhat difficult  Some recent data might be hidden   PHQ-2/9 Result reviewed  Fall Risk: Fall Risk  08/10/2020 05/20/2020 11/10/2019 07/08/2019 06/17/2019  Falls in the past year? 0 0 0 1 1  Number falls in past yr: 0 0 0 0 1  Injury with Fall? 0 0 0 1 1  Risk Factor Category  - - - - -  Risk for fall due to : - - - - -  Risk for fall due to: Comment - - - - -  Follow up - Falls evaluation completed - - -     Assessment & Plan  1. Viral upper respiratory tract infection Noted concerns with the patient with her current infectious symptoms,  consistent with an upper respiratory infection, likely viral, and possibly Covid.  Given her comorbidities, felt best to get a Covid test, and she states she can do so, and went for testing back in February as well.  Noted can do a test through [facility], and can sign up through the website.  Can also get a test through her local pharmacy if more convenient.  If the test returns positive, she is to call and let us know, as noted with her comorbidities, she would be a candidate for monoclonal antibody treatment, and that can be helpful in the management of Covid. Felt best to continue symptomatic measures presently as do feel likely of viral upper respiratory infection, and emphasized the importance of continuing to closely monitor.  She has not had fevers, no purulent or discolored mucus, no shortness of breath.  We will continue with a Mucinex product as an expectorant, as do think that can be helpful as needed. Also recommended Tylenol products for the throat discomfort, and can use lozenges or throat sprays as needed. Rest and staying well-hydrated important. If symptoms not improving or more problematic over the next couple days, she should follow-up as soon as Friday of this week as may need a more aggressive approach.  She was understanding of that. Also noted if symptoms acutely worsen with increased shortness of breath, high fevers, passing out feelings, chest pains, she needs to be seen more emergently in an ER setting and she was understanding of that.  2. Type 2 diabetes mellitus with both eyes affected by proliferative retinopathy and macular edema, without long-term current use of insulin ([place]) 3. CKD (chronic kidney disease) stage 2, GFR 60-89 ml/min 4. Morbid obesity ([place]) 5. Essential hypertension  Noting her age and the comorbidities, she is at significantly increased risk for severe infection with Covid, and noted the importance of getting tested and contacting us if it is positive as noted  above.  Also, the importance of closely monitoring and contacting us  if symptoms are not improving or more problematic over the next couple days, to help reassess at that point.  Noted the limitations of this being a phone visit as well.  I discussed the assessment and treatment plan with the patient. The patient was provided an opportunity to ask questions and all were answered. The patient agreed with the plan and demonstrated an understanding of the instructions.  Red flags and when to present for emergency care or RTC including fevers, chest pain, shortness of breath, new/worsening/un-resolving symptoms reviewed with patient at time of visit.   The patient was advised to call back or seek an in-person evaluation if the symptoms worsen or if the condition fails to improve as anticipated.  I provided 20 minutes of non-face-to-face time during this encounter that included discussing at length patient's sx/history, pertinent pmhx, medications, treatment and follow up plan. This time also included the necessary documentation, orders, and chart review.  Towanda Malkin, MD

## 2020-08-30 ENCOUNTER — Telehealth: Payer: Self-pay | Admitting: Family Medicine

## 2020-08-30 NOTE — Telephone Encounter (Signed)
Patient declined the Medicare Wellness Visit with [facility]. She declined due to not being able to see her provider when she was previously sick. Does not feel AWV is needed if she cant see her provider.

## 2020-09-07 ENCOUNTER — Other Ambulatory Visit: Payer: Self-pay | Admitting: Family Medicine

## 2020-09-07 DIAGNOSIS — I10 Essential (primary) hypertension: Secondary | ICD-10-CM

## 2020-09-16 ENCOUNTER — Encounter: Payer: Self-pay | Admitting: Internal Medicine

## 2020-09-16 ENCOUNTER — Ambulatory Visit (INDEPENDENT_AMBULATORY_CARE_PROVIDER_SITE_OTHER): Payer: Medicare Other | Admitting: Internal Medicine

## 2020-09-16 ENCOUNTER — Other Ambulatory Visit: Payer: Self-pay

## 2020-09-16 DIAGNOSIS — M545 Low back pain, unspecified: Secondary | ICD-10-CM | POA: Diagnosis not present

## 2020-09-16 DIAGNOSIS — M810 Age-related osteoporosis without current pathological fracture: Secondary | ICD-10-CM

## 2020-09-16 DIAGNOSIS — E113513 Type 2 diabetes mellitus with proliferative diabetic retinopathy with macular edema, bilateral: Secondary | ICD-10-CM

## 2020-09-16 DIAGNOSIS — J310 Chronic rhinitis: Secondary | ICD-10-CM | POA: Diagnosis not present

## 2020-09-16 MED ORDER — TRAMADOL HCL 50 MG PO TABS: 50.0000 mg | ORAL_TABLET | Freq: Three times a day (TID) | ORAL | 0 refills | Status: AC | PRN

## 2020-09-16 MED ORDER — CYCLOBENZAPRINE HCL 5 MG PO TABS: 5.0000 mg | ORAL_TABLET | Freq: Three times a day (TID) | ORAL | 1 refills | Status: DC | PRN

## 2020-09-16 NOTE — Progress Notes (Signed)
Name: Julie [name]   MRN: [id number]    DOB: 01/17/1946   Date:09/16/2020       Progress Note  Subjective  Chief Complaint  Chief Complaint  Patient presents with  . Back Pain    Hx of back problems x 2 years.  . Cough  . Sore Throat    comes and goes has gradually gotten better.    I connected with  Harrison Mons on 09/16/20 at  1:40 PM EST by telephone and verified that I am speaking with the correct person using two identifiers.  I discussed the limitations, risks, security and privacy concerns of performing an evaluation and management service by telephone and the availability of in person appointments. The patient expressed understanding and agreed to proceed. Staff also discussed with the patient that there may be a patient responsible charge related to this service. Patient Location: Home Provider Location: [facility] Additional Individuals present: none  HPI  Patient is a 74 year old female patient of Delsa Grana Last visit with her was 05/20/2020 A prior phone visit was noted with me 08/10/2020 for cough and congestion  Recommended she get a Covid test at that time, to let us know if it is positive, and also to treat with symptomatic measures including Tylenol and Mucinex products, lozenges or throat sprays, staying well-hydrated and relative rest and following up if symptoms were not improving or worsening. She is also followed by endocrinology and nephrology for diabetes, osteoporosis, and chronic kidney disease.  Also with hypertension and hyperlipidemia. Follows up today with back pain Noted limitations with this being a phone visit.  Sees endocrine for osteoporosis. Has next follow-up appt with Dr. Honor Junes on 12/29 Notes has a h/o of more severe back pains intermittently, goes out about twice a year, and noted had been well with the last time this happened about 2 years ago. Had shingles at that time as well and treated with [facility] for shingles and helped with back  pain. Usually had muscle relaxers in past to help.  She notes has back pain just about every morning, and this am, had trouble getting up and out of bed, trouble standing upright Notes pain a  5-6/10 on the pain scale at its worst Pain felt in lower back across below her waist, not higher up where kidneys are No acute trauma prior to onset  Some pain at rest and significantly increased with movements No pain radiates down the legs No numbness, tingling or marked weakness in LE's, no foot drop No bowel or bladder dysfunction symptoms, noted lost control of her bladder this am due to not being able to get up and get to her BR, no saddle anesthesia symptoms No fevers or other infectious symptoms of concern, except has had some intermittent sore throat and some mild persistent of cold symptoms intermittently in the past 1 to 2 months.  (See below) Has tried tylenol prn, heat topically to help   Has a h/o low back pain in the past,  No h/o major prior back injury/fracture/surgery No cancer history + diabetes history   She also noted some persistence of her cough and mild sore throat symptoms, has been on and off as she describes.  After our last visit had a covid test and negative.  Was a little better after that. Notes the sore throat comes and goes and usually in the morning. Denies any fevers, no bad sore throats, no discolored mucus, min cough in am, no increase. Had Covid  vaccine, last one in March No marked shortness of breath. Has been taking some of the symptomatic measures intermittently that was discussed on our last visit.  Tob - Never smoker       Patient Active Problem List   Diagnosis Date Noted  . Other and unspecified hyperlipidemia 09/29/2019  . Benign essential hypertension 09/29/2019  . Bilateral lower extremity edema 07/08/2019  . Hypertension associated with diabetes ([place]) 07/29/2018  . Type 2 diabetes mellitus with microalbuminuria, without long-term current  use of insulin ([place]) 07/29/2018  . Age-related osteoporosis without current pathological fracture 07/29/2018  . Hyperlipidemia associated with type 2 diabetes mellitus ([place]) 07/29/2018  . Stool guaiac positive 06/03/2017  . Dermatitis 01/04/2017  . Low back pain 06/06/2016  . Medication monitoring encounter 11/30/2015  . Varicose vein of leg 11/30/2015  . Abnormal mammogram 08/04/2015  . Cardiac murmur 07/28/2015  . CKD (chronic kidney disease) stage 2, GFR 60-89 ml/min   . Morbid obesity ([place])   . Osteoporosis   . Proteinuria 10/15/2013    Past Surgical History:  Procedure Laterality Date  . COLONOSCOPY WITH PROPOFOL N/A 07/16/2017   Procedure: COLONOSCOPY WITH PROPOFOL;  Surgeon: Robert Bellow, MD;  Location: [facility] ENDOSCOPY;  Service: Endoscopy;  Laterality: N/A;  . DILATION AND CURETTAGE OF UTERUS  2007  . ESOPHAGOGASTRODUODENOSCOPY (EGD) WITH PROPOFOL N/A 07/16/2017   Procedure: ESOPHAGOGASTRODUODENOSCOPY (EGD) WITH PROPOFOL;  Surgeon: Robert Bellow, MD;  Location: [facility] ENDOSCOPY;  Service: Endoscopy;  Laterality: N/A;  . MOUTH SURGERY  2007   cyst  . TONSILLECTOMY      Family History  Problem Relation Age of Onset  . Glaucoma Mother   . Cancer Father        liver  . Cancer Sister        lung, bone  . COPD Sister   . Heart disease Maternal Grandmother        chf  . Hypertension Brother   . Hypertension Maternal Uncle   . Ovarian cancer Paternal Grandmother   . Cancer Paternal Grandmother        ovarian cancer  . Breast cancer Neg Hx     Social History   Tobacco Use  . Smoking status: Never Smoker  . Smokeless tobacco: Never Used  . Tobacco comment: smoking cessation materials not required  Substance Use Topics  . Alcohol use: No     Current Outpatient Medications:  .  alendronate (FOSAMAX) 70 MG tablet, Take 1 tablet (70 mg total) by mouth once a week., Disp: 12 tablet, Rfl: 3 .  amLODipine (NORVASC) 10 MG tablet, TAKE 1 TABLET BY MOUTH  DAILY,  Disp: 90 tablet, Rfl: 3 .  aspirin 81 MG tablet, Take 81 mg by mouth daily., Disp: , Rfl:  .  atorvastatin (LIPITOR) 40 MG tablet, Take 0.5 tablets (20 mg total) by mouth at bedtime., Disp: 45 tablet, Rfl: 3 .  bevacizumab (AVASTIN) 1.25 mg/0.1 mL SOLN, 10 mg by Intravitreal route to Surgery., Disp: , Rfl:  .  carvedilol (COREG) 6.25 MG tablet, Take by mouth., Disp: , Rfl:  .  Cholecalciferol (VITAMIN D-1000 MAX ST) 1000 UNITS tablet, Take 1,000 Units by mouth daily., Disp: , Rfl:  .  furosemide (LASIX) 40 MG tablet, Take by mouth., Disp: , Rfl:  .  glipiZIDE (GLUCOTROL XL) 10 MG 24 hr tablet, TAKE 1 TABLET BY MOUTH  TWICE A DAY, Disp: 180 tablet, Rfl: 1 .  hydrochlorothiazide (HYDRODIURIL) 25 MG tablet, TAKE 1 TABLET BY MOUTH  DAILY, Disp: 90 tablet, Rfl: 3 .  lisinopril (ZESTRIL) 40 MG tablet, Take 1 tablet (40 mg total) by mouth daily., Disp: 90 tablet, Rfl: 3 .  metFORMIN (GLUCOPHAGE) 500 MG tablet, TAKE 2 TABLETS BY MOUTH TWO TIMES DAILY, Disp: 360 tablet, Rfl: 1 .  Multiple Vitamin (MULTI-VITAMINS) TABS, Take by mouth daily. , Disp: , Rfl:  .  Omega-3 Fatty Acids (FISH OIL) 1000 MG CAPS, Take 1,000 mg by mouth 2 (two) times daily., Disp: , Rfl:  .  ONETOUCH VERIO test strip, 1 each daily., Disp: , Rfl:  .  cyclobenzaprine (FLEXERIL) 5 MG tablet, Take 1 tablet (5 mg total) by mouth 3 (three) times daily as needed for muscle spasms. Take mainly at bedtime as may make drowsy, Disp: 21 tablet, Rfl: 1 .  traMADol (ULTRAM) 50 MG tablet, Take 1 tablet (50 mg total) by mouth every 8 (eight) hours as needed for up to 5 days for severe pain. Make drowsy, do not drive when taking, Disp: 15 tablet, Rfl: 0  Allergies  Allergen Reactions  . Nsaids Other (See Comments)  . Penicillins Other (See Comments)    Not sure of reaction     With staff assistance, above reviewed with the patient today.  ROS: As per HPI, otherwise no specific complaints on a limited and focused system review    Objective  Virtual encounter, vitals not obtained.  There is no height or weight on file to calculate BMI.  Physical Exam   Appears in NAD via conversation, no cough during our conversation, Breathing: No obvious respiratory distress. Speaking in complete sentences Neurological: Pt is alert, Speech is normal, quite verbose and difficult to interrupt to ask questions at times Psychiatric: Patient has a normal mood and affect, Judgment and thought content normal.   No results found for this or any previous visit (from the past 72 hour(s)).  PHQ2/9: Depression screen [facility] 2/9 08/10/2020 05/20/2020 11/10/2019 07/08/2019 06/17/2019  Decreased Interest 0 1 0 0 1  Down, Depressed, Hopeless 1 1 1 2 1   PHQ - 2 Score 1 2 1 2 2   Altered sleeping - 1 3 1  0  Tired, decreased energy - 1 1 1 1   Change in appetite - 1 0 0 1  Feeling bad or failure about yourself  - 1 0 0 0  Trouble concentrating - 0 0 0 0  Moving slowly or fidgety/restless - 0 0 0 0  Suicidal thoughts - 0 0 0 0  PHQ-9 Score - 6 5 4 4   Difficult doing work/chores - - Not difficult at all Somewhat difficult Somewhat difficult  Some recent data might be hidden   PHQ-2/9 Result reviewed  Fall Risk: Fall Risk  09/16/2020 08/10/2020 05/20/2020 11/10/2019 07/08/2019  Falls in the past year? 0 0 0 0 1  Number falls in past yr: 0 0 0 0 0  Injury with Fall? 0 0 0 0 1  Risk Factor Category  - - - - -  Risk for fall due to : - - - - -  Risk for fall due to: Comment - - - - -  Follow up - - Falls evaluation completed - -     Assessment & Plan 1. Acute bilateral low back pain without sciatica Patient noted the episodes of low back pain like this happen intermittently, and has not been all that frequent in the recent past.  She had no prior trauma.  I did echoed concerns with her history of osteoporosis and  diabetes, the potential for more concerning causes and the potential need for imaging to help further investigate.  She states in the  past she gets medicines and it does get better fairly quickly, and that she has a follow-up with Dr. Honor Junes again at the end of the month. Agreed to add the medications below to help, and local measures recommended as well, and if not improving or more problematic through the weekend into early next week, she should follow-up again with [place], and likely will get imaging and discuss next steps. Presently, felt reasonable to prescribe a muscle relaxer and a tramadol product as below noted.  She noted her husband takes tramadol as needed at times, and is helpful, and did feel was reasonable only to use in the short-term and she was understanding of that.  Discussed it is a controlled substance, and makes drowsy, and not to drive while taking.  Also only to use for severe pain and in the very short-term. Do not feel steroids are a great option with her diabetes, and also she was told previously to avoid NSAIDs, and will try to do so presently. - cyclobenzaprine (FLEXERIL) 5 MG tablet; Take 1 tablet (5 mg total) by mouth 3 (three) times daily as needed for muscle spasms. Take mainly at bedtime as may make drowsy  Dispense: 21 tablet; Refill: 1 - traMADol (ULTRAM) 50 MG tablet; Take 1 tablet (50 mg total) by mouth every 8 (eight) hours as needed for up to 5 days for severe pain. Make drowsy, do not drive when taking  Dispense: 15 tablet; Refill: 0 Can also continue the arthritic try to strength Tylenol as needed, saving the tramadol product for more severe pain episodes. Also recommended local measures with warm compresses, followed by some range of motion activities, followed by cold, with cold important in these first few days. Relative rest recommended, and slow increase in movements  over time. Await her response presently.  2. Osteoporosis without current pathological fracture, unspecified osteoporosis type 3. Type 2 diabetes mellitus with both eyes affected by proliferative retinopathy and macular  edema, without long-term current use of insulin (HCC) As noted above, are red flags and noted this concern and the need for imaging more acutely if symptoms not improving or worsening as noted above. Also, await any further input with her follow-up with Dr. Honor Junes at the end of the month, and emphasized the importance of keeping that follow-up.  4. Chronic rhinitis, intermittent sore throats Question if the dryness contributing as it is worse in the morning, and noted we will sleep with her mouth open and with the colder weather and heat on in the recent past, can increase dryness.  May be contributing to some of her intermittent postnasal drip issues as well.  Do feel the cough is secondary to that, as it is in the morning, and she notes not a real problem presently. Recommended a humidifier, and she noted she has 1, and will try to use more routinely presently. Also can continue the symptomatic measures she has been using, and if symptoms not improving or more problematic over time she will again follow-up.   I discussed the assessment and treatment plan with the patient. The patient was provided an opportunity to ask questions and all were answered. The patient agreed with the plan and demonstrated an understanding of the instructions.   The patient was advised to call back or seek an in-person evaluation if the symptoms worsen or if the condition fails to  improve as anticipated.  I provided 20 minutes of non-face-to-face time during this encounter that included discussing at length patient's sx/history, pertinent pmhx, medications, treatment and follow up plan. This time also included the necessary documentation, orders, and chart review.  Towanda Malkin, MD

## 2020-10-04 LAB — HEMOGLOBIN A1C: Hemoglobin A1C: 6.1

## 2020-11-25 ENCOUNTER — Ambulatory Visit (INDEPENDENT_AMBULATORY_CARE_PROVIDER_SITE_OTHER): Payer: Medicare Other | Admitting: Family Medicine

## 2020-11-25 ENCOUNTER — Encounter: Payer: Self-pay | Admitting: Family Medicine

## 2020-11-25 ENCOUNTER — Other Ambulatory Visit: Payer: Self-pay

## 2020-11-25 VITALS — BP 128/78 | HR 89 | Temp 97.8°F | Resp 16 | Ht 64.0 in | Wt 267.2 lb

## 2020-11-25 DIAGNOSIS — I10 Essential (primary) hypertension: Secondary | ICD-10-CM

## 2020-11-25 DIAGNOSIS — E113513 Type 2 diabetes mellitus with proliferative diabetic retinopathy with macular edema, bilateral: Secondary | ICD-10-CM

## 2020-11-25 DIAGNOSIS — Z1159 Encounter for screening for other viral diseases: Secondary | ICD-10-CM

## 2020-11-25 DIAGNOSIS — J069 Acute upper respiratory infection, unspecified: Secondary | ICD-10-CM

## 2020-11-25 DIAGNOSIS — Z5181 Encounter for therapeutic drug level monitoring: Secondary | ICD-10-CM

## 2020-11-25 DIAGNOSIS — Z1231 Encounter for screening mammogram for malignant neoplasm of breast: Secondary | ICD-10-CM

## 2020-11-25 DIAGNOSIS — M810 Age-related osteoporosis without current pathological fracture: Secondary | ICD-10-CM

## 2020-11-25 DIAGNOSIS — N183 Chronic kidney disease, stage 3 unspecified: Secondary | ICD-10-CM | POA: Diagnosis not present

## 2020-11-25 DIAGNOSIS — R600 Localized edema: Secondary | ICD-10-CM

## 2020-11-25 DIAGNOSIS — N182 Chronic kidney disease, stage 2 (mild): Secondary | ICD-10-CM

## 2020-11-25 DIAGNOSIS — E782 Mixed hyperlipidemia: Secondary | ICD-10-CM

## 2020-11-25 DIAGNOSIS — J310 Chronic rhinitis: Secondary | ICD-10-CM

## 2020-11-25 DIAGNOSIS — R059 Cough, unspecified: Secondary | ICD-10-CM

## 2020-11-25 NOTE — Progress Notes (Signed)
Name: Julie [name]   MRN: [id number]    DOB: 06/18/1946   Date:11/25/2020       Progress Note  Chief Complaint  Patient presents with  . Follow-up  . Hypertension  . Hyperlipidemia  . Diabetes    See's ENdo     Subjective:   Julie [name] is a 75 y.o. female, presents to clinic for routine f/up  DM - endo Last A1C 6.1 and home health came out and it was 5.9  Glipizide 5 mg XL BID, decreased from 10 mg BID Metformin 1000 mg BID  LE edema, sparingly using lasix - in the past several months she has only used once  CKD -  Lab Results  Component Value Date   CREATININE 1.19 (H) 05/20/2020   Lab Results  Component Value Date   BUN 28 (H) 05/20/2020   Lab Results  Component Value Date   GFRNONAA 46 (L) 05/20/2020   Hypertension:  Currently managed on HCTZ 25 mg, coreg 6.25 mg, norvasc 10mg  daily Pt reports good med compliance and denies any SE.   Blood pressure today is well controlled. BP Readings from Last 3 Encounters:  11/25/20 128/78  05/20/20 134/78  11/10/19 136/80   Pt denies CP, SOB, exertional sx, LE edema, palpitation, Ha's, visual disturbances, lightheadedness, hypotension, syncope. Dietary efforts for BP?  Changed   Hyperlipidemia: Currently treated with taking lipitor 40 mg tab - taking half 20 mg daily, lipids just checked with endo in Dec Last Lipids: Lab Results  Component Value Date   CHOL 135 11/10/2019   HDL 42 (L) 11/10/2019   LDLCALC 75 11/10/2019   TRIG 98 11/10/2019   CHOLHDL 3.2 11/10/2019   - Denies: Chest pain, shortness of breath, myalgias, claudication  Osteoporosis - on fosamax, vit d, calcium and PTH just checked by nephrology  Endo - managing  Updated Dexa 06/01/2020 - reviewed today through care everywhere    Health Maintenance  Topic Date Due  . MAMMOGRAM  02/13/2019  . COVID-19 Vaccine (3 - Booster for Pfizer series) 06/30/2020  . Hepatitis C Screening  05/26/2021 (Originally [birth year])  . HEMOGLOBIN A1C   04/04/2021  . OPHTHALMOLOGY EXAM  07/11/2021  . FOOT EXAM  10/04/2021  . TETANUS/TDAP  07/07/2029  . INFLUENZA VACCINE  Completed  . DEXA SCAN  Completed  . PNA vac Low Risk Adult  Completed  . Fecal DNA (Cologuard)  Discontinued      Current Outpatient Medications:  .  alendronate (FOSAMAX) 70 MG tablet, Take 1 tablet (70 mg total) by mouth once a week., Disp: 12 tablet, Rfl: 3 .  amLODipine (NORVASC) 10 MG tablet, TAKE 1 TABLET BY MOUTH  DAILY, Disp: 90 tablet, Rfl: 3 .  aspirin 81 MG tablet, Take 81 mg by mouth daily., Disp: , Rfl:  .  atorvastatin (LIPITOR) 40 MG tablet, Take 0.5 tablets (20 mg total) by mouth at bedtime., Disp: 45 tablet, Rfl: 3 .  bevacizumab (AVASTIN) 1.25 mg/0.1 mL SOLN, 10 mg by Intravitreal route to Surgery., Disp: , Rfl:  .  carvedilol (COREG) 6.25 MG tablet, Take by mouth., Disp: , Rfl:  .  Cholecalciferol 25 MCG (1000 UT) tablet, Take 1,000 Units by mouth daily., Disp: , Rfl:  .  cyclobenzaprine (FLEXERIL) 5 MG tablet, Take 1 tablet (5 mg total) by mouth 3 (three) times daily as needed for muscle spasms. Take mainly at bedtime as may make drowsy, Disp: 21 tablet, Rfl: 1 .  furosemide (LASIX) 40 MG tablet,  Take 20 mg by mouth as needed., Disp: , Rfl:  .  hydrochlorothiazide (HYDRODIURIL) 25 MG tablet, TAKE 1 TABLET BY MOUTH  DAILY, Disp: 90 tablet, Rfl: 3 .  lisinopril (ZESTRIL) 40 MG tablet, Take 1 tablet (40 mg total) by mouth daily., Disp: 90 tablet, Rfl: 3 .  metFORMIN (GLUCOPHAGE) 500 MG tablet, TAKE 2 TABLETS BY MOUTH TWO TIMES DAILY, Disp: 360 tablet, Rfl: 1 .  Multiple Vitamin (MULTI-VITAMINS) TABS, Take by mouth daily. , Disp: , Rfl:  .  Omega-3 Fatty Acids (FISH OIL) 1000 MG CAPS, Take 1,000 mg by mouth 2 (two) times daily., Disp: , Rfl:  .  ONETOUCH VERIO test strip, 1 each daily., Disp: , Rfl:  .  glipiZIDE (GLUCOTROL XL) 5 MG 24 hr tablet, Take 5 mg by mouth 2 (two) times daily., Disp: , Rfl:   Patient Active Problem List   Diagnosis Date Noted   . Mixed hyperlipidemia 09/29/2019  . Benign essential hypertension 09/29/2019  . Bilateral lower extremity edema 07/08/2019  . Essential hypertension 07/29/2018  . Type 2 diabetes mellitus with microalbuminuria, without long-term current use of insulin ([place]) 07/29/2018  . Age-related osteoporosis without current pathological fracture 07/29/2018  . Hyperlipidemia associated with type 2 diabetes mellitus ([place]) 07/29/2018  . Stool guaiac positive 06/03/2017  . Dermatitis 01/04/2017  . Low back pain 06/06/2016  . Medication monitoring encounter 11/30/2015  . Varicose vein of leg 11/30/2015  . Abnormal mammogram 08/04/2015  . Cardiac murmur 07/28/2015  . CKD (chronic kidney disease) stage 2, GFR 60-89 ml/min   . Morbid obesity ([place])   . Osteoporosis   . Proteinuria 10/15/2013    Past Surgical History:  Procedure Laterality Date  . COLONOSCOPY WITH PROPOFOL N/A 07/16/2017   Procedure: COLONOSCOPY WITH PROPOFOL;  Surgeon: Robert Bellow, MD;  Location: [facility] ENDOSCOPY;  Service: Endoscopy;  Laterality: N/A;  . DILATION AND CURETTAGE OF UTERUS  2007  . ESOPHAGOGASTRODUODENOSCOPY (EGD) WITH PROPOFOL N/A 07/16/2017   Procedure: ESOPHAGOGASTRODUODENOSCOPY (EGD) WITH PROPOFOL;  Surgeon: Robert Bellow, MD;  Location: [facility];  Service: Endoscopy;  Laterality: N/A;  . MOUTH SURGERY  2007   cyst  . TONSILLECTOMY      Family History  Problem Relation Age of Onset  . Glaucoma Mother   . Cancer Father        liver  . Cancer Sister        lung, bone  . COPD Sister   . Heart disease Maternal Grandmother        chf  . Hypertension Brother   . Hypertension Maternal Uncle   . Ovarian cancer Paternal Grandmother   . Cancer Paternal Grandmother        ovarian cancer  . Breast cancer Neg Hx     Social History   Tobacco Use  . Smoking status: Never Smoker  . Smokeless tobacco: Never Used  . Tobacco comment: smoking cessation materials not required  Vaping Use  . Vaping  Use: Never used  Substance Use Topics  . Alcohol use: No  . Drug use: No     Allergies  Allergen Reactions  . Nsaids Other (See Comments)  . Penicillins Other (See Comments)    Not sure of reaction     Health Maintenance  Topic Date Due  . MAMMOGRAM  02/13/2019  . COVID-19 Vaccine (3 - Booster for Pfizer series) 06/30/2020  . Hepatitis C Screening  05/26/2021 (Originally Jul 18, 1946)  . HEMOGLOBIN A1C  04/04/2021  . OPHTHALMOLOGY EXAM  07/11/2021  .  FOOT EXAM  10/04/2021  . TETANUS/TDAP  07/07/2029  . INFLUENZA VACCINE  Completed  . DEXA SCAN  Completed  . PNA vac Low Risk Adult  Completed  . Fecal DNA (Cologuard)  Discontinued    Chart Review Today: I personally reviewed active problem list, medication list, allergies, family history, social history, health maintenance, notes from last encounter, lab results, imaging with the patient/caregiver today.   Review of Systems  Constitutional: Negative.   HENT: Negative.   Eyes: Negative.   Respiratory: Negative.   Cardiovascular: Negative.   Gastrointestinal: Negative.   Endocrine: Negative.   Genitourinary: Negative.   Musculoskeletal: Negative.   Skin: Negative.   Allergic/Immunologic: Negative.   Neurological: Negative.   Hematological: Negative.   Psychiatric/Behavioral: Negative.   All other systems reviewed and are negative.    Objective:   Vitals:   11/25/20 1006  BP: 128/78  Pulse: 89  Resp: 16  Temp: 97.8 F (36.6 C)  SpO2: 92%  Weight: 267 lb 3.2 oz (121.2 kg)  Height: 5\' 4"  (1.626 m)    Body mass index is 45.86 kg/m.  Physical Exam Vitals and nursing note reviewed.  Constitutional:      General: She is not in acute distress.    Appearance: Normal appearance. She is well-developed. She is not ill-appearing, toxic-appearing or diaphoretic.     Interventions: Face mask in place.  HENT:     Head: Normocephalic and atraumatic.     Right Ear: External ear normal.     Left Ear: External ear  normal.  Eyes:     General: Lids are normal. No scleral icterus.       Right eye: No discharge.        Left eye: No discharge.     Conjunctiva/sclera: Conjunctivae normal.  Neck:     Trachea: Phonation normal. No tracheal deviation.  Cardiovascular:     Rate and Rhythm: Normal rate and regular rhythm.     Pulses: Normal pulses.          Radial pulses are 2+ on the right side and 2+ on the left side.       Posterior tibial pulses are 2+ on the right side and 2+ on the left side.     Heart sounds: Normal heart sounds. No murmur heard. No friction rub. No gallop.   Pulmonary:     Effort: Pulmonary effort is normal. No respiratory distress.     Breath sounds: Normal breath sounds. No stridor. No wheezing, rhonchi or rales.  Chest:     Chest wall: No tenderness.  Abdominal:     General: Bowel sounds are normal. There is no distension.     Palpations: Abdomen is soft.  Skin:    General: Skin is warm and dry.     Coloration: Skin is not jaundiced or pale.     Findings: No rash.  Neurological:     Mental Status: She is alert.     Motor: No abnormal muscle tone.     Gait: Gait normal.  Psychiatric:        Mood and Affect: Mood normal.        Speech: Speech normal.        Behavior: Behavior normal.         Assessment & Plan:   1. Type 2 diabetes mellitus with both eyes affected by proliferative retinopathy and macular edema, without long-term current use of insulin (HCC) Managed per endo - labs reviewed, last A1C 5.9 -  glipiZIDE (GLUCOTROL XL) 5 MG 24 hr tablet; Take 5 mg by mouth 2 (two) times daily.  2. Stage 3 chronic kidney disease, unspecified whether stage 3a or 3b CKD ([place]) Reviewed labs, stable GFR Lab Results  Component Value Date   CREATININE 1.19 (H) 05/20/2020   Lab Results  Component Value Date   BUN 28 (H) 05/20/2020   Lab Results  Component Value Date   GFRNONAA 46 (L) 05/20/2020     3. Morbid obesity ([employer]) Improving weight with lifestyle/diet  efforts Wt Readings from Last 5 Encounters:  11/25/20 267 lb 3.2 oz (121.2 kg)  05/20/20 271 lb 1.6 oz (123 kg)  11/10/19 280 lb 12.8 oz (127.4 kg)  07/08/19 285 lb 14.4 oz (129.7 kg)  06/17/19 293 lb 11.2 oz (133.2 kg)   BMI Readings from Last 5 Encounters:  11/25/20 45.86 kg/m  05/20/20 46.53 kg/m  11/10/19 48.20 kg/m  07/08/19 52.29 kg/m  06/17/19 53.72 kg/m   4. Essential hypertension Stable, well controlled, BP at goal today - labs reviewed  Continue HCTZ, coreg and amlodipine 5. Mixed hyperlipidemia Labs reviewed, lipids at goal, good statin compliance, no current SE or concerns  6. Encounter for medication monitoring Labs reviewed through chart and care everywhere today  7. Encounter for screening mammogram for malignant neoplasm of breast  - MM 3D SCREEN BREAST BILATERAL; Future  8. Encounter for hepatitis C screening test for low risk patient declined  9. Bilateral lower extremity edema Improved, not using lasix as much, monitoring, continue low salt diet  10. Age-related osteoporosis without current pathological fracture Reviewed dexa, supplements and other supportive and preventative care  11. Cough Acute sx and concerns today, URI sx with cough, screen for COVID, offered CXR, though lungs sound clear - Novel Coronavirus, NAA ([facility]) - DG Chest 2 View; Future - SARS-COV-2, NAA 2 DAY TAT  12. Upper respiratory tract infection, unspecified type Discussed supportive and sx management - Novel Coronavirus, NAA ([facility]) - DG Chest 2 View; Future - SARS-COV-2, NAA 2 DAY TAT  13. Rhinitis, unspecified type See above - Novel Coronavirus, NAA ([facility]) - SARS-COV-2, NAA 2 DAY TAT   6 month f/up [place] if needed for URI sx or if worsening  Delsa Grana, PA-C 11/25/20 10:39 AM

## 2020-11-27 LAB — SARS-COV-2, NAA 2 DAY TAT

## 2020-11-27 LAB — NOVEL CORONAVIRUS, NAA: SARS-CoV-2, NAA: NOT DETECTED

## 2021-01-21 ENCOUNTER — Encounter: Payer: Self-pay | Admitting: Emergency Medicine

## 2021-01-21 ENCOUNTER — Ambulatory Visit
Admission: EM | Admit: 2021-01-21 | Discharge: 2021-01-21 | Disposition: A | Discharge status: Home / Self Care | Payer: Medicare Other | Attending: Family Medicine | Admitting: Family Medicine

## 2021-01-21 ENCOUNTER — Ambulatory Visit (INDEPENDENT_AMBULATORY_CARE_PROVIDER_SITE_OTHER): Payer: Medicare Other

## 2021-01-21 ENCOUNTER — Other Ambulatory Visit: Payer: Self-pay

## 2021-01-21 DIAGNOSIS — I10 Essential (primary) hypertension: Secondary | ICD-10-CM | POA: Diagnosis present

## 2021-01-21 DIAGNOSIS — R0602 Shortness of breath: Secondary | ICD-10-CM | POA: Diagnosis not present

## 2021-01-21 LAB — CBC WITH DIFFERENTIAL/PLATELET
Abs Immature Granulocytes: 0.03 10*3/uL (ref 0.00–0.07)
Basophils Absolute: 0 10*3/uL (ref 0.0–0.1)
Basophils Relative: 1 %
Eosinophils Absolute: 0.3 10*3/uL (ref 0.0–0.5)
Eosinophils Relative: 4 %
HCT: 36 % (ref 36.0–46.0)
Hemoglobin: 11.5 g/dL — ABNORMAL LOW (ref 12.0–15.0)
Immature Granulocytes: 0 %
Lymphocytes Relative: 16 %
Lymphs Abs: 1.2 10*3/uL (ref 0.7–4.0)
MCH: 25.8 pg — ABNORMAL LOW (ref 26.0–34.0)
MCHC: 31.9 g/dL (ref 30.0–36.0)
MCV: 80.7 fL (ref 80.0–100.0)
Monocytes Absolute: 0.5 10*3/uL (ref 0.1–1.0)
Monocytes Relative: 7 %
Neutro Abs: 5.5 10*3/uL (ref 1.7–7.7)
Neutrophils Relative %: 72 %
Platelets: 200 10*3/uL (ref 150–400)
RBC: 4.46 MIL/uL (ref 3.87–5.11)
RDW: 17.4 % — ABNORMAL HIGH (ref 11.5–15.5)
WBC: 7.6 10*3/uL (ref 4.0–10.5)
nRBC: 0 % (ref 0.0–0.2)

## 2021-01-21 LAB — COMPREHENSIVE METABOLIC PANEL
ALT: 17 U/L (ref 0–44)
AST: 21 U/L (ref 15–41)
Albumin: 3.8 g/dL (ref 3.5–5.0)
Alkaline Phosphatase: 59 U/L (ref 38–126)
Anion gap: 7 (ref 5–15)
BUN: 33 mg/dL — ABNORMAL HIGH (ref 8–23)
CO2: 27 mmol/L (ref 22–32)
Calcium: 9.6 mg/dL (ref 8.9–10.3)
Chloride: 105 mmol/L (ref 98–111)
Creatinine, Ser: 1.19 mg/dL — ABNORMAL HIGH (ref 0.44–1.00)
GFR, Estimated: 49 mL/min — ABNORMAL LOW (ref >60)
Glucose, Bld: 115 mg/dL — ABNORMAL HIGH (ref 70–99)
Potassium: 4 mmol/L (ref 3.5–5.1)
Sodium: 139 mmol/L (ref 135–145)
Total Bilirubin: 0.4 mg/dL (ref 0.3–1.2)
Total Protein: 7 g/dL (ref 6.5–8.1)

## 2021-01-21 LAB — TROPONIN I (HIGH SENSITIVITY): Troponin I (High Sensitivity): 6 ng/L (ref <18)

## 2021-01-21 MED ORDER — FUROSEMIDE 20 MG PO TABS: 20.0000 mg | ORAL_TABLET | Freq: Every day | ORAL | 0 refills | Status: DC

## 2021-01-21 NOTE — Discharge Instructions (Signed)
Labs and xray look good.  Restart lasix.  Follow up with PCP next week. Be sure to eat potassium rich foods.  Take care  Dr. Lacinda Axon

## 2021-01-21 NOTE — ED Triage Notes (Addendum)
Patient c/o elevated BP, weakness, SOB and left arm pain x 2 weeks. Denies chest pain. Patient also reports LE edema that has worsened.

## 2021-01-22 NOTE — ED Provider Notes (Signed)
[facility]    CSN: [id number] Arrival date & time: 01/21/21  1420  History   Chief Complaint Chief Complaint  Patient presents with  . Hypertension  . Weakness  . Shortness of Breath   HPI 75 year old female presents with multiple complaints.  Patient reports that she has had ongoing symptoms for the past 2 weeks.  She states that her blood pressure has been elevated, she has had some generalized weakness, some shortness of breath particular with exertion, and intermittent left arm pain.  Patient states that she has had some central chest pain but is not have any now.  She endorses compliance with her home medications.  She has not discussed this with her primary care physician or seen her primary care physician recently.  Patient is concerned given her symptoms and decided to come in for evaluation.  No other complaints or concerns at this time.  Past Medical History:  Diagnosis Date  . Arthritis   . Cataract   . CKD (chronic kidney disease) stage 2, GFR 60-89 ml/min   . Diabetes mellitus without complication ([place])   . Hyperlipidemia   . Hypertension   . Morbid obesity ([place])   . Osteoporosis March 2015  . Proteinuria Jan 2015   referred to Nephrology    Patient Active Problem List   Diagnosis Date Noted  . Mixed hyperlipidemia 09/29/2019  . Benign essential hypertension 09/29/2019  . Bilateral lower extremity edema 07/08/2019  . Essential hypertension 07/29/2018  . Type 2 diabetes mellitus with microalbuminuria, without long-term current use of insulin ([place]) 07/29/2018  . Age-related osteoporosis without current pathological fracture 07/29/2018  . Hyperlipidemia associated with type 2 diabetes mellitus ([place]) 07/29/2018  . Stool guaiac positive 06/03/2017  . Dermatitis 01/04/2017  . Low back pain 06/06/2016  . Medication monitoring encounter 11/30/2015  . Varicose vein of leg 11/30/2015  . Abnormal mammogram 08/04/2015  . Cardiac murmur 07/28/2015  . CKD  (chronic kidney disease) stage 2, GFR 60-89 ml/min   . Morbid obesity ([place])   . Osteoporosis   . Proteinuria 10/15/2013    Past Surgical History:  Procedure Laterality Date  . COLONOSCOPY WITH PROPOFOL N/A 07/16/2017   Procedure: COLONOSCOPY WITH PROPOFOL;  Surgeon: Robert Bellow, MD;  Location: [facility] ENDOSCOPY;  Service: Endoscopy;  Laterality: N/A;  . DILATION AND CURETTAGE OF UTERUS  2007  . ESOPHAGOGASTRODUODENOSCOPY (EGD) WITH PROPOFOL N/A 07/16/2017   Procedure: ESOPHAGOGASTRODUODENOSCOPY (EGD) WITH PROPOFOL;  Surgeon: Robert Bellow, MD;  Location: [facility];  Service: Endoscopy;  Laterality: N/A;  . MOUTH SURGERY  2007   cyst  . TONSILLECTOMY      OB History   No obstetric history on file.      Home Medications    Prior to Admission medications   Medication Sig Start Date End Date Taking? Authorizing Provider  alendronate (FOSAMAX) 70 MG tablet Take 1 tablet (70 mg total) by mouth once a week. 05/13/20  Yes Delsa Grana, PA-C  amLODipine (NORVASC) 10 MG tablet TAKE 1 TABLET BY MOUTH  DAILY 09/12/20  Yes Delsa Grana, PA-C  aspirin 81 MG tablet Take 81 mg by mouth daily.   Yes [provider]  atorvastatin (LIPITOR) 40 MG tablet Take 0.5 tablets (20 mg total) by mouth at bedtime. 06/20/20  Yes Delsa Grana, PA-C  carvedilol (COREG) 6.25 MG tablet Take 6.25 mg by mouth 2 (two) times daily with a meal. 02/08/20 02/07/21 Yes [provider]  furosemide (LASIX) 20 MG tablet Take 1 tablet (  20 mg total) by mouth daily. 01/21/21  Yes Channell Quattrone G, DO  glipiZIDE (GLUCOTROL XL) 5 MG 24 hr tablet Take 5 mg by mouth 2 (two) times daily. 11/18/20  Yes [provider]  hydrochlorothiazide (HYDRODIURIL) 25 MG tablet TAKE 1 TABLET BY MOUTH  DAILY 06/20/20  Yes Delsa Grana, PA-C  lisinopril (ZESTRIL) 40 MG tablet Take 1 tablet (40 mg total) by mouth daily. 05/17/20  Yes Delsa Grana, PA-C  metFORMIN (GLUCOPHAGE) 500 MG tablet TAKE 2 TABLETS BY MOUTH TWO TIMES  DAILY 07/28/18  Yes Lada, Satira Anis, MD  Multiple Vitamin (MULTI-VITAMINS) TABS Take by mouth daily.    Yes [provider]  Omega-3 Fatty Acids (FISH OIL) 1000 MG CAPS Take 1,000 mg by mouth 2 (two) times daily.   Yes [provider]  [facility] VERIO test strip 1 each daily. 03/26/20  Yes [provider]  bevacizumab (AVASTIN) 1.25 mg/0.1 mL SOLN 10 mg by Intravitreal route to Surgery.    [provider]  Cholecalciferol 25 MCG (1000 UT) tablet Take 1,000 Units by mouth daily.    [provider]    Family History Family History  Problem Relation Age of Onset  . Glaucoma Mother   . Cancer Father        liver  . Cancer Sister        lung, bone  . COPD Sister   . Heart disease Maternal Grandmother        chf  . Hypertension Brother   . Hypertension Maternal Uncle   . Ovarian cancer Paternal Grandmother   . Cancer Paternal Grandmother        ovarian cancer  . Breast cancer Neg Hx     Social History Social History   Tobacco Use  . Smoking status: Never Smoker  . Smokeless tobacco: Never Used  . Tobacco comment: smoking cessation materials not required  Vaping Use  . Vaping Use: Never used  Substance Use Topics  . Alcohol use: No  . Drug use: No     Allergies   Nsaids and Penicillins   Review of Systems Review of Systems Per HPI  Physical Exam Triage Vital Signs ED Triage Vitals  Enc Vitals Group     BP 01/21/21 1501 (!) 167/74     Pulse Rate 01/21/21 1501 93     Resp 01/21/21 1501 20     Temp 01/21/21 1501 97.8 F (36.6 C)     Temp Source 01/21/21 1501 Oral     SpO2 01/21/21 1501 90 %     Weight 01/21/21 1456 267 lb 3.2 oz (121.2 kg)     Height 01/21/21 1456 5\' 4"  (1.626 m)     Head Circumference --      Peak Flow --      Pain Score 01/21/21 1610 0     Pain Loc --      Pain Edu? --      Excl. in [place]? --    Updated Vital Signs BP (!) 167/74 (BP Location: Right Arm)   Pulse 93   Temp 97.8 F (36.6 C) (Oral)    Resp 20   Ht 5\' 4"  (1.626 m)   Wt 121.2 kg   SpO2 94%   BMI 45.86 kg/m   Visual Acuity Right Eye Distance:   Left Eye Distance:   Bilateral Distance:    Right Eye Near:   Left Eye Near:    Bilateral Near:     Physical Exam Vitals and  nursing note reviewed.  Constitutional:      General: She is not in acute distress.    Appearance: She is well-developed. She is obese. She is not ill-appearing.  HENT:     Head: Normocephalic and atraumatic.  Eyes:     General:        Right eye: No discharge.        Left eye: No discharge.     Conjunctiva/sclera: Conjunctivae normal.  Cardiovascular:     Rate and Rhythm: Normal rate and regular rhythm.  Pulmonary:     Effort: Pulmonary effort is normal.     Breath sounds: Rales present.  Neurological:     Mental Status: She is alert.  Psychiatric:        Mood and Affect: Mood normal.        Behavior: Behavior normal.    UC Treatments / Results  Labs (all labs ordered are listed, but only abnormal results are displayed) Labs Reviewed  CBC WITH DIFFERENTIAL/PLATELET - Abnormal; Notable for the following components:      Result Value   Hemoglobin 11.5 (*)    MCH 25.8 (*)    RDW 17.4 (*)    All other components within normal limits  COMPREHENSIVE METABOLIC PANEL - Abnormal; Notable for the following components:   Glucose, Bld 115 (*)    BUN 33 (*)    Creatinine, Ser 1.19 (*)    GFR, Estimated 49 (*)    All other components within normal limits  TROPONIN I (HIGH SENSITIVITY)  TROPONIN I (HIGH SENSITIVITY)    EKG Interpretation: Normal sinus rhythm with a rate of 86.  Biatrial enlargement.  No appreciable ST or T wave changes.  Radiology DG Chest 2 View  Result Date: 01/21/2021 CLINICAL DATA:  Shortness of breath and hypertension EXAM: CHEST - 2 VIEW COMPARISON:  July 03, 2018 FINDINGS: There is elevation of the left hemidiaphragm with mild left base atelectasis. The lungs elsewhere are clear. The heart size and pulmonary  vascularity are normal. No adenopathy. There is aortic atherosclerosis. There is degenerative change in the thoracic spine. IMPRESSION: Elevation left hemidiaphragm with mild left base atelectasis. Lungs elsewhere clear. Heart size normal. Aortic Atherosclerosis (ICD10-I70.0). Electronically Signed   By: Lowella Grip III M.D.   On: 01/21/2021 16:00    Procedures Procedures (including critical care time)  Medications Ordered in UC Medications - No data to display  Initial Impression / Assessment and Plan / UC Course  I have reviewed the triage vital signs and the nursing notes.  Pertinent labs & imaging results that were available during my care of the patient were reviewed by me and considered in my medical decision making (see chart for details).    75 year old female presents with the above complaints.  Regarding her hypertension, she is not at goal, I advised to restart Lasix.  Rx sent to the pharmacy.  Regarding her shortness of breath and other complaints, EKG was unremarkable.  Chest x-ray was obtained and was independent reviewed by me.  Interpretation: No appreciable pneumonia or evidence of congestive heart failure.  She does have elevated left hemidiaphragm.  Advised to follow-up with her primary care provider and/or cardiology.  Supportive care.  Final Clinical Impressions(s) / UC Diagnoses   Final diagnoses:  Essential hypertension  SOB (shortness of breath)     Discharge Instructions     Labs and xray look good.  Restart lasix.  Follow up with PCP next week. Be sure to eat potassium rich  foods.  Take care  Dr. Lacinda Axon    ED Prescriptions    Medication Sig Dispense Auth. Provider   furosemide (LASIX) 20 MG tablet Take 1 tablet (20 mg total) by mouth daily. 30 tablet Coral Spikes, DO     PDMP not reviewed this encounter.   Thersa Salt [place], [place] 01/22/21 [phone]

## 2021-02-11 ENCOUNTER — Other Ambulatory Visit: Payer: Self-pay

## 2021-02-11 ENCOUNTER — Emergency Department: Payer: Medicare Other

## 2021-02-11 ENCOUNTER — Emergency Department
Admission: EM | Admit: 2021-02-11 | Discharge: 2021-02-11 | Disposition: A | Discharge status: Home / Self Care | Payer: Medicare Other | Attending: Emergency Medicine | Admitting: Emergency Medicine

## 2021-02-11 DIAGNOSIS — R531 Weakness: Secondary | ICD-10-CM | POA: Diagnosis not present

## 2021-02-11 DIAGNOSIS — Z79899 Other long term (current) drug therapy: Secondary | ICD-10-CM | POA: Diagnosis not present

## 2021-02-11 DIAGNOSIS — I129 Hypertensive chronic kidney disease with stage 1 through stage 4 chronic kidney disease, or unspecified chronic kidney disease: Secondary | ICD-10-CM | POA: Diagnosis not present

## 2021-02-11 DIAGNOSIS — M25562 Pain in left knee: Secondary | ICD-10-CM | POA: Insufficient documentation

## 2021-02-11 DIAGNOSIS — Z7984 Long term (current) use of oral hypoglycemic drugs: Secondary | ICD-10-CM | POA: Diagnosis not present

## 2021-02-11 DIAGNOSIS — S9032XA Contusion of left foot, initial encounter: Secondary | ICD-10-CM

## 2021-02-11 DIAGNOSIS — W01198A Fall on same level from slipping, tripping and stumbling with subsequent striking against other object, initial encounter: Secondary | ICD-10-CM | POA: Insufficient documentation

## 2021-02-11 DIAGNOSIS — Y9301 Activity, walking, marching and hiking: Secondary | ICD-10-CM | POA: Insufficient documentation

## 2021-02-11 DIAGNOSIS — Y92009 Unspecified place in unspecified non-institutional (private) residence as the place of occurrence of the external cause: Secondary | ICD-10-CM | POA: Insufficient documentation

## 2021-02-11 DIAGNOSIS — Z7982 Long term (current) use of aspirin: Secondary | ICD-10-CM | POA: Insufficient documentation

## 2021-02-11 DIAGNOSIS — S99922A Unspecified injury of left foot, initial encounter: Secondary | ICD-10-CM | POA: Diagnosis present

## 2021-02-11 DIAGNOSIS — N182 Chronic kidney disease, stage 2 (mild): Secondary | ICD-10-CM | POA: Diagnosis not present

## 2021-02-11 DIAGNOSIS — E1122 Type 2 diabetes mellitus with diabetic chronic kidney disease: Secondary | ICD-10-CM | POA: Insufficient documentation

## 2021-02-11 DIAGNOSIS — W19XXXA Unspecified fall, initial encounter: Secondary | ICD-10-CM

## 2021-02-11 LAB — URINALYSIS, COMPLETE (UACMP) WITH MICROSCOPIC
Bacteria, UA: NONE SEEN
Bilirubin Urine: NEGATIVE
Glucose, UA: 50 mg/dL — AB
Ketones, ur: NEGATIVE mg/dL
Leukocytes,Ua: NEGATIVE
Nitrite: NEGATIVE
Protein, ur: >=300 mg/dL — AB
Specific Gravity, Urine: 1.015 (ref 1.005–1.030)
pH: 5 (ref 5.0–8.0)

## 2021-02-11 LAB — BASIC METABOLIC PANEL
Anion gap: 9 (ref 5–15)
BUN: 23 mg/dL (ref 8–23)
CO2: 24 mmol/L (ref 22–32)
Calcium: 9.1 mg/dL (ref 8.9–10.3)
Chloride: 106 mmol/L (ref 98–111)
Creatinine, Ser: 1.21 mg/dL — ABNORMAL HIGH (ref 0.44–1.00)
GFR, Estimated: 48 mL/min — ABNORMAL LOW (ref >60)
Glucose, Bld: 145 mg/dL — ABNORMAL HIGH (ref 70–99)
Potassium: 4.3 mmol/L (ref 3.5–5.1)
Sodium: 139 mmol/L (ref 135–145)

## 2021-02-11 LAB — CBC
HCT: 37.5 % (ref 36.0–46.0)
Hemoglobin: 11.7 g/dL — ABNORMAL LOW (ref 12.0–15.0)
MCH: 25.5 pg — ABNORMAL LOW (ref 26.0–34.0)
MCHC: 31.2 g/dL (ref 30.0–36.0)
MCV: 81.9 fL (ref 80.0–100.0)
Platelets: 187 10*3/uL (ref 150–400)
RBC: 4.58 MIL/uL (ref 3.87–5.11)
RDW: 17.2 % — ABNORMAL HIGH (ref 11.5–15.5)
WBC: 6.7 10*3/uL (ref 4.0–10.5)
nRBC: 0 % (ref 0.0–0.2)

## 2021-02-11 LAB — CBG MONITORING, ED: Glucose-Capillary: 146 mg/dL — ABNORMAL HIGH (ref 70–99)

## 2021-02-11 NOTE — ED Triage Notes (Signed)
Pt to ER via POV after falling at home today.   Pt reports having little strength in her legs, typically ambulates with a cane but lately has been having increased episodes of weakness. T  oday while ambulating states she felt her knees buckle and she fell to the floor. Denies hitting head or use of blood thinners.   Pt has bruising and swelling present to L foot and complains of L knee pain.

## 2021-02-11 NOTE — ED Notes (Signed)
Pt presents after falling at home, tripped over the edge of her bed and c/o left knee and left foot pain. Pt denies LOC or hitting head, is alert and oriented to person, place, time situation.

## 2021-02-11 NOTE — ED Provider Notes (Signed)
[facility] Emergency Department Provider Note   ____________________________________________   Event Date/Time   First MD Initiated Contact with Patient 02/11/21 1733     (approximate)  I have reviewed the triage vital signs and the nursing notes.   HISTORY  Chief Complaint Fall    HPI Julie [name] is a 75 y.o. female with past medical history of hypertension, hyperlipidemia, diabetes, and CKD who presents to the ED following fall.  Patient reports that she was walking around her home when her legs seem to give out on her, causing her to fall to the ground.  She reports hitting her left knee and foot, denies hitting her head or losing consciousness.  She did not feel like she was going to pass out and denies any chest pain or shortness of breath.  She has otherwise been feeling well recently with no fevers, cough, nausea, vomiting, diarrhea, dysuria, or hematuria.  She does state that she has been feeling weaker than usual recently with her legs frequently giving out on her.  She now complains of pain in her left knee and left foot, denies pain in her right lower extremity or bilateral upper extremities.        Past Medical History:  Diagnosis Date  . Arthritis   . Cataract   . CKD (chronic kidney disease) stage 2, GFR 60-89 ml/min   . Diabetes mellitus without complication ([place])   . Hyperlipidemia   . Hypertension   . Morbid obesity ([place])   . Osteoporosis March 2015  . Proteinuria Jan 2015   referred to Nephrology    Patient Active Problem List   Diagnosis Date Noted  . Mixed hyperlipidemia 09/29/2019  . Benign essential hypertension 09/29/2019  . Bilateral lower extremity edema 07/08/2019  . Essential hypertension 07/29/2018  . Type 2 diabetes mellitus with microalbuminuria, without long-term current use of insulin ([place]) 07/29/2018  . Age-related osteoporosis without current pathological fracture 07/29/2018  . Hyperlipidemia associated  with type 2 diabetes mellitus ([place]) 07/29/2018  . Stool guaiac positive 06/03/2017  . Dermatitis 01/04/2017  . Low back pain 06/06/2016  . Medication monitoring encounter 11/30/2015  . Varicose vein of leg 11/30/2015  . Abnormal mammogram 08/04/2015  . Cardiac murmur 07/28/2015  . CKD (chronic kidney disease) stage 2, GFR 60-89 ml/min   . Morbid obesity ([place])   . Osteoporosis   . Proteinuria 10/15/2013    Past Surgical History:  Procedure Laterality Date  . COLONOSCOPY WITH PROPOFOL N/A 07/16/2017   Procedure: COLONOSCOPY WITH PROPOFOL;  Surgeon: Robert Bellow, MD;  Location: [facility] ENDOSCOPY;  Service: Endoscopy;  Laterality: N/A;  . DILATION AND CURETTAGE OF UTERUS  2007  . ESOPHAGOGASTRODUODENOSCOPY (EGD) WITH PROPOFOL N/A 07/16/2017   Procedure: ESOPHAGOGASTRODUODENOSCOPY (EGD) WITH PROPOFOL;  Surgeon: Robert Bellow, MD;  Location: [facility] ENDOSCOPY;  Service: Endoscopy;  Laterality: N/A;  . MOUTH SURGERY  2007   cyst  . TONSILLECTOMY      Prior to Admission medications   Medication Sig Start Date End Date Taking? Authorizing Provider  alendronate (FOSAMAX) 70 MG tablet Take 1 tablet (70 mg total) by mouth once a week. 05/13/20   Delsa Grana, PA-C  amLODipine (NORVASC) 10 MG tablet TAKE 1 TABLET BY MOUTH  DAILY 09/12/20   Delsa Grana, PA-C  aspirin 81 MG tablet Take 81 mg by mouth daily.    [provider]  atorvastatin (LIPITOR) 40 MG tablet Take 0.5 tablets (20 mg total) by mouth at bedtime. 06/20/20  Delsa Grana, PA-C  bevacizumab (AVASTIN) 1.25 mg/0.1 mL SOLN 10 mg by Intravitreal route to Surgery.    [provider]  carvedilol (COREG) 6.25 MG tablet Take 6.25 mg by mouth 2 (two) times daily with a meal. 02/08/20 02/07/21  [provider]  Cholecalciferol 25 MCG (1000 UT) tablet Take 1,000 Units by mouth daily.    [provider]  furosemide (LASIX) 20 MG tablet Take 1 tablet (20 mg total) by mouth daily. 01/21/21   Coral Spikes, DO   glipiZIDE (GLUCOTROL XL) 5 MG 24 hr tablet Take 5 mg by mouth 2 (two) times daily. 11/18/20   [provider]  hydrochlorothiazide (HYDRODIURIL) 25 MG tablet TAKE 1 TABLET BY MOUTH  DAILY 06/20/20   Delsa Grana, PA-C  lisinopril (ZESTRIL) 40 MG tablet Take 1 tablet (40 mg total) by mouth daily. 05/17/20   Delsa Grana, PA-C  metFORMIN (GLUCOPHAGE) 500 MG tablet TAKE 2 TABLETS BY MOUTH TWO TIMES DAILY 07/28/18   Arnetha Courser, MD  Multiple Vitamin (MULTI-VITAMINS) TABS Take by mouth daily.     [provider]  Omega-3 Fatty Acids (FISH OIL) 1000 MG CAPS Take 1,000 mg by mouth 2 (two) times daily.    [provider]  [facility] VERIO test strip 1 each daily. 03/26/20   [provider]    Allergies Nsaids and Penicillins  Family History  Problem Relation Age of Onset  . Glaucoma Mother   . Cancer Father        liver  . Cancer Sister        lung, bone  . COPD Sister   . Heart disease Maternal Grandmother        chf  . Hypertension Brother   . Hypertension Maternal Uncle   . Ovarian cancer Paternal Grandmother   . Cancer Paternal Grandmother        ovarian cancer  . Breast cancer Neg Hx     Social History Social History   Tobacco Use  . Smoking status: Never Smoker  . Smokeless tobacco: Never Used  . Tobacco comment: smoking cessation materials not required  Vaping Use  . Vaping Use: Never used  Substance Use Topics  . Alcohol use: No  . Drug use: No    Review of Systems  Constitutional: No fever/chills.  Positive for generalized weakness. Eyes: No visual changes. ENT: No sore throat. Cardiovascular: Denies chest pain. Respiratory: Denies shortness of breath. Gastrointestinal: No abdominal pain.  No nausea, no vomiting.  No diarrhea.  No constipation. Genitourinary: Negative for dysuria. Musculoskeletal: Negative for back pain.  Positive for left knee and foot pain. Skin: Negative for rash. Neurological: Negative for headaches, focal  weakness or numbness.  ____________________________________________   PHYSICAL EXAM:  VITAL SIGNS: ED Triage Vitals  Enc Vitals Group     BP 02/11/21 1613 (!) 178/84     Pulse Rate 02/11/21 1613 86     Resp 02/11/21 1613 20     Temp 02/11/21 1613 98.6 F (37 C)     Temp Source 02/11/21 1613 Oral     SpO2 02/11/21 1613 96 %     Weight 02/11/21 1614 270 lb (122.5 kg)     Height 02/11/21 1614 5\' 4"  (1.626 m)     Head Circumference --      Peak Flow --      Pain Score 02/11/21 1614 3     Pain Loc --      Pain Edu? --  Excl. in [place]? --     Constitutional: Alert and oriented. Eyes: Conjunctivae are normal. Head: Atraumatic. Nose: No congestion/rhinnorhea. Mouth/Throat: Mucous membranes are moist. Neck: Normal ROM,  Cardiovascular: Normal rate, regular rhythm. Grossly normal heart sounds. Respiratory: Normal respiratory effort.  No retractions. Lungs CTAB. Gastrointestinal: Soft and nontender. No distention. Genitourinary: deferred Musculoskeletal: Ecchymosis to dorsum of left foot with associated tenderness, no ankle tenderness or obvious deformity.  Diffuse tenderness of left knee with no obvious deformity.  No tenderness to palpation of right lower extremity or bilateral upper extremities. Neurologic:  Normal speech and language. No gross focal neurologic deficits are appreciated. Skin:  Skin is warm, dry and intact. No rash noted. Psychiatric: Mood and affect are normal. Speech and behavior are normal.  ____________________________________________   LABS (all labs ordered are listed, but only abnormal results are displayed)  Labs Reviewed  BASIC METABOLIC PANEL - Abnormal; Notable for the following components:      Result Value   Glucose, Bld 145 (*)    Creatinine, Ser 1.21 (*)    GFR, Estimated 48 (*)    All other components within normal limits  CBC - Abnormal; Notable for the following components:   Hemoglobin 11.7 (*)    MCH 25.5 (*)    RDW 17.2 (*)     All other components within normal limits  URINALYSIS, COMPLETE (UACMP) WITH MICROSCOPIC - Abnormal; Notable for the following components:   Color, Urine YELLOW (*)    APPearance HAZY (*)    Glucose, UA 50 (*)    Hgb urine dipstick SMALL (*)    Protein, ur >=300 (*)    All other components within normal limits  CBG MONITORING, ED - Abnormal; Notable for the following components:   Glucose-Capillary 146 (*)    All other components within normal limits   ____________________________________________  EKG  ED ECG REPORT I, Blake Divine, the attending physician, personally viewed and interpreted this ECG.   Date: 02/11/2021  EKG Time: 16:26  Rate: 80  Rhythm: normal sinus rhythm  Axis: Normal  Intervals:none  ST&T Change: None   PROCEDURES  Procedure(s) performed (including Critical Care):  Procedures   ____________________________________________   INITIAL IMPRESSION / ASSESSMENT AND PLAN / ED COURSE       75 year old female with past medical history of hypertension, hyperlipidemia, diabetes, and CKD who presents to the ED for generalized weakness today causing her to fall and strike her left leg.  She denies any trauma to her head or neck.  X-rays of left knee and foot reviewed by me with no fracture or dislocation.  She does have ecchymosis and soft tissue injury to the dorsum of her left foot.  As for her generalized weakness, EKG shows no evidence of arrhythmia or ischemia and labs are unremarkable.  We will check UA but if this is negative, patient would be appropriate for discharge home with PCP follow-up.  UA shows no signs of infection, patient has been able to ambulate here in the ED.  She is appropriate for discharge home with PCP follow-up, was counseled to return to the ED for new or worsening symptoms.  Patient and husband agree with plan.      ____________________________________________   FINAL CLINICAL IMPRESSION(S) / ED DIAGNOSES  Final diagnoses:   Fall, initial encounter  Contusion of left foot, initial encounter  Generalized weakness     ED Discharge Orders    None       Note:  This document was prepared  using Systems analyst and may include unintentional dictation errors.   Blake Divine, MD 02/11/21 [phone]

## 2021-02-11 NOTE — ED Notes (Signed)
Pt ambulated to bedside toilet with standby assist, assisted back to bed. Bed in low position with wheels locked, side rails up x2, call light in reach. Pt's family present at bedside.

## 2021-02-23 ENCOUNTER — Other Ambulatory Visit: Payer: Self-pay | Admitting: Family Medicine

## 2021-04-14 ENCOUNTER — Other Ambulatory Visit: Payer: Self-pay | Admitting: Family Medicine

## 2021-05-09 ENCOUNTER — Other Ambulatory Visit: Payer: Self-pay | Admitting: Family Medicine

## 2021-05-26 ENCOUNTER — Ambulatory Visit: Payer: Medicare Other | Admitting: Family Medicine

## 2021-07-06 ENCOUNTER — Other Ambulatory Visit: Payer: Self-pay | Admitting: Internal Medicine

## 2021-07-06 DIAGNOSIS — R590 Localized enlarged lymph nodes: Secondary | ICD-10-CM

## 2021-07-07 ENCOUNTER — Ambulatory Visit
Admission: RE | Admit: 2021-07-07 | Discharge: 2021-07-07 | Disposition: A | Discharge status: Home / Self Care | Payer: Medicare Other | Source: Ambulatory Visit | Attending: Internal Medicine | Admitting: Internal Medicine

## 2021-07-07 ENCOUNTER — Other Ambulatory Visit: Payer: Self-pay | Admitting: Internal Medicine

## 2021-07-07 ENCOUNTER — Other Ambulatory Visit: Payer: Self-pay

## 2021-07-07 ENCOUNTER — Ambulatory Visit: Payer: Medicare Other

## 2021-07-07 DIAGNOSIS — R0602 Shortness of breath: Secondary | ICD-10-CM | POA: Insufficient documentation

## 2021-07-07 DIAGNOSIS — J984 Other disorders of lung: Secondary | ICD-10-CM

## 2021-07-07 DIAGNOSIS — R0902 Hypoxemia: Secondary | ICD-10-CM

## 2021-07-07 MED ORDER — IOHEXOL 350 MG/ML SOLN
60.0000 mL | Freq: Once | INTRAVENOUS | Status: AC | PRN
  Administered 2021-07-07: 60 mL via INTRAVENOUS

## 2021-07-20 ENCOUNTER — Other Ambulatory Visit: Payer: Self-pay | Admitting: Family Medicine

## 2021-07-20 DIAGNOSIS — I10 Essential (primary) hypertension: Secondary | ICD-10-CM

## 2021-07-24 ENCOUNTER — Inpatient Hospital Stay
Admission: EM | Admit: 2021-07-24 | Discharge: 2021-07-27 | DRG: 291 | Disposition: A | Discharge status: Home / Self Care | Payer: Medicare Other | Attending: Internal Medicine | Admitting: Internal Medicine

## 2021-07-24 ENCOUNTER — Emergency Department: Payer: Medicare Other

## 2021-07-24 ENCOUNTER — Other Ambulatory Visit: Payer: Self-pay

## 2021-07-24 DIAGNOSIS — Z7983 Long term (current) use of bisphosphonates: Secondary | ICD-10-CM

## 2021-07-24 DIAGNOSIS — Z6841 Body Mass Index (BMI) 40.0 and over, adult: Secondary | ICD-10-CM

## 2021-07-24 DIAGNOSIS — J9621 Acute and chronic respiratory failure with hypoxia: Secondary | ICD-10-CM

## 2021-07-24 DIAGNOSIS — Z20822 Contact with and (suspected) exposure to covid-19: Secondary | ICD-10-CM | POA: Diagnosis present

## 2021-07-24 DIAGNOSIS — M810 Age-related osteoporosis without current pathological fracture: Secondary | ICD-10-CM | POA: Diagnosis present

## 2021-07-24 DIAGNOSIS — Z886 Allergy status to analgesic agent status: Secondary | ICD-10-CM

## 2021-07-24 DIAGNOSIS — I130 Hypertensive heart and chronic kidney disease with heart failure and stage 1 through stage 4 chronic kidney disease, or unspecified chronic kidney disease: Secondary | ICD-10-CM | POA: Diagnosis not present

## 2021-07-24 DIAGNOSIS — Z8249 Family history of ischemic heart disease and other diseases of the circulatory system: Secondary | ICD-10-CM

## 2021-07-24 DIAGNOSIS — E1129 Type 2 diabetes mellitus with other diabetic kidney complication: Secondary | ICD-10-CM | POA: Diagnosis present

## 2021-07-24 DIAGNOSIS — I10 Essential (primary) hypertension: Secondary | ICD-10-CM | POA: Diagnosis present

## 2021-07-24 DIAGNOSIS — N189 Chronic kidney disease, unspecified: Secondary | ICD-10-CM

## 2021-07-24 DIAGNOSIS — R0602 Shortness of breath: Secondary | ICD-10-CM | POA: Diagnosis not present

## 2021-07-24 DIAGNOSIS — I509 Heart failure, unspecified: Secondary | ICD-10-CM

## 2021-07-24 DIAGNOSIS — N179 Acute kidney failure, unspecified: Secondary | ICD-10-CM

## 2021-07-24 DIAGNOSIS — Z7982 Long term (current) use of aspirin: Secondary | ICD-10-CM

## 2021-07-24 DIAGNOSIS — E113513 Type 2 diabetes mellitus with proliferative diabetic retinopathy with macular edema, bilateral: Secondary | ICD-10-CM

## 2021-07-24 DIAGNOSIS — Z83511 Family history of glaucoma: Secondary | ICD-10-CM

## 2021-07-24 DIAGNOSIS — E785 Hyperlipidemia, unspecified: Secondary | ICD-10-CM

## 2021-07-24 DIAGNOSIS — E782 Mixed hyperlipidemia: Secondary | ICD-10-CM | POA: Diagnosis present

## 2021-07-24 DIAGNOSIS — N182 Chronic kidney disease, stage 2 (mild): Secondary | ICD-10-CM | POA: Diagnosis present

## 2021-07-24 DIAGNOSIS — E1169 Type 2 diabetes mellitus with other specified complication: Secondary | ICD-10-CM

## 2021-07-24 DIAGNOSIS — Z880 Allergy status to penicillin: Secondary | ICD-10-CM

## 2021-07-24 DIAGNOSIS — E1122 Type 2 diabetes mellitus with diabetic chronic kidney disease: Secondary | ICD-10-CM | POA: Diagnosis present

## 2021-07-24 DIAGNOSIS — D649 Anemia, unspecified: Secondary | ICD-10-CM

## 2021-07-24 DIAGNOSIS — Z79899 Other long term (current) drug therapy: Secondary | ICD-10-CM

## 2021-07-24 DIAGNOSIS — J9601 Acute respiratory failure with hypoxia: Secondary | ICD-10-CM

## 2021-07-24 DIAGNOSIS — Z7984 Long term (current) use of oral hypoglycemic drugs: Secondary | ICD-10-CM

## 2021-07-24 DIAGNOSIS — I5033 Acute on chronic diastolic (congestive) heart failure: Secondary | ICD-10-CM

## 2021-07-24 DIAGNOSIS — G4733 Obstructive sleep apnea (adult) (pediatric): Secondary | ICD-10-CM | POA: Diagnosis present

## 2021-07-24 DIAGNOSIS — Z8041 Family history of malignant neoplasm of ovary: Secondary | ICD-10-CM

## 2021-07-24 DIAGNOSIS — M1990 Unspecified osteoarthritis, unspecified site: Secondary | ICD-10-CM | POA: Diagnosis present

## 2021-07-24 DIAGNOSIS — N1831 Chronic kidney disease, stage 3a: Secondary | ICD-10-CM | POA: Diagnosis present

## 2021-07-24 DIAGNOSIS — Z23 Encounter for immunization: Secondary | ICD-10-CM

## 2021-07-24 DIAGNOSIS — Z825 Family history of asthma and other chronic lower respiratory diseases: Secondary | ICD-10-CM

## 2021-07-24 DIAGNOSIS — Z9114 Patient's other noncompliance with medication regimen: Secondary | ICD-10-CM

## 2021-07-24 LAB — CBC
HCT: 33.4 % — ABNORMAL LOW (ref 36.0–46.0)
Hemoglobin: 10.1 g/dL — ABNORMAL LOW (ref 12.0–15.0)
MCH: 26.2 pg (ref 26.0–34.0)
MCHC: 30.2 g/dL (ref 30.0–36.0)
MCV: 86.5 fL (ref 80.0–100.0)
Platelets: 162 10*3/uL (ref 150–400)
RBC: 3.86 MIL/uL — ABNORMAL LOW (ref 3.87–5.11)
RDW: 18.6 % — ABNORMAL HIGH (ref 11.5–15.5)
WBC: 6.7 10*3/uL (ref 4.0–10.5)
nRBC: 0.3 % — ABNORMAL HIGH (ref 0.0–0.2)

## 2021-07-24 LAB — BRAIN NATRIURETIC PEPTIDE: B Natriuretic Peptide: 198.4 pg/mL — ABNORMAL HIGH (ref 0.0–100.0)

## 2021-07-24 LAB — BASIC METABOLIC PANEL
Anion gap: 7 (ref 5–15)
BUN: 44 mg/dL — ABNORMAL HIGH (ref 8–23)
CO2: 30 mmol/L (ref 22–32)
Calcium: 9 mg/dL (ref 8.9–10.3)
Chloride: 103 mmol/L (ref 98–111)
Creatinine, Ser: 1.78 mg/dL — ABNORMAL HIGH (ref 0.44–1.00)
GFR, Estimated: 30 mL/min — ABNORMAL LOW (ref >60)
Glucose, Bld: 138 mg/dL — ABNORMAL HIGH (ref 70–99)
Potassium: 3.9 mmol/L (ref 3.5–5.1)
Sodium: 140 mmol/L (ref 135–145)

## 2021-07-24 LAB — GLUCOSE, CAPILLARY
Glucose-Capillary: 186 mg/dL — ABNORMAL HIGH (ref 70–99)
Glucose-Capillary: 114 mg/dL — ABNORMAL HIGH (ref 70–99)

## 2021-07-24 LAB — RESP PANEL BY RT-PCR (FLU A&B, COVID) ARPGX2
Influenza A by PCR: NEGATIVE
Influenza B by PCR: NEGATIVE
SARS Coronavirus 2 by RT PCR: NEGATIVE

## 2021-07-24 LAB — HEMOGLOBIN A1C
Hgb A1c MFr Bld: 5.4 % (ref 4.8–5.6)
Mean Plasma Glucose: 108 mg/dL

## 2021-07-24 LAB — TROPONIN I (HIGH SENSITIVITY)
Troponin I (High Sensitivity): 8 ng/L (ref <18)
Troponin I (High Sensitivity): 9 ng/L (ref <18)

## 2021-07-24 LAB — PROCALCITONIN: Procalcitonin: <0.1 ng/mL

## 2021-07-24 MED ORDER — ACETAMINOPHEN 325 MG PO TABS
650.0000 mg | ORAL_TABLET | Freq: Four times a day (QID) | ORAL | Status: AC | PRN
  Administered 2021-07-25 – 2021-07-27 (×2): 650 mg via ORAL
  Filled 2021-07-24 (×2): qty 2

## 2021-07-24 MED ORDER — INSULIN ASPART 100 UNIT/ML IJ SOLN: 0.0000 [IU] | Freq: Every day | INTRAMUSCULAR | Status: DC

## 2021-07-24 MED ORDER — HEPARIN SODIUM (PORCINE) 5000 UNIT/ML IJ SOLN
5000.0000 [IU] | Freq: Three times a day (TID) | INTRAMUSCULAR | Status: DC
  Administered 2021-07-24 – 2021-07-26 (×6): 5000 [IU] via SUBCUTANEOUS
  Filled 2021-07-24 (×6): qty 1

## 2021-07-24 MED ORDER — INSULIN ASPART 100 UNIT/ML IJ SOLN
0.0000 [IU] | Freq: Three times a day (TID) | INTRAMUSCULAR | Status: DC
  Administered 2021-07-24: 2 [IU] via SUBCUTANEOUS
  Administered 2021-07-25: 13:00:00 3 [IU] via SUBCUTANEOUS
  Administered 2021-07-25: 08:00:00 2 [IU] via SUBCUTANEOUS
  Administered 2021-07-26 – 2021-07-27 (×4): 3 [IU] via SUBCUTANEOUS
  Filled 2021-07-24 (×7): qty 1

## 2021-07-24 MED ORDER — AMLODIPINE BESYLATE 10 MG PO TABS
10.0000 mg | ORAL_TABLET | Freq: Every day | ORAL | Status: DC
  Administered 2021-07-24 – 2021-07-26 (×3): 10 mg via ORAL
  Filled 2021-07-24 (×3): qty 1

## 2021-07-24 MED ORDER — ACETAMINOPHEN 650 MG RE SUPP: 650.0000 mg | Freq: Four times a day (QID) | RECTAL | Status: AC | PRN

## 2021-07-24 MED ORDER — HYDRALAZINE HCL 50 MG PO TABS
25.0000 mg | ORAL_TABLET | Freq: Four times a day (QID) | ORAL | Status: DC | PRN
Start: 2021-07-24 — End: 2021-07-27

## 2021-07-24 MED ORDER — FUROSEMIDE 10 MG/ML IJ SOLN
40.0000 mg | Freq: Every day | INTRAMUSCULAR | Status: DC
  Administered 2021-07-25 – 2021-07-26 (×2): 40 mg via INTRAVENOUS
  Filled 2021-07-24 (×2): qty 4

## 2021-07-24 MED ORDER — ONDANSETRON HCL 4 MG/2ML IJ SOLN: 4.0000 mg | Freq: Four times a day (QID) | INTRAMUSCULAR | Status: DC | PRN

## 2021-07-24 MED ORDER — ONDANSETRON HCL 4 MG PO TABS: 4.0000 mg | ORAL_TABLET | Freq: Four times a day (QID) | ORAL | Status: DC | PRN

## 2021-07-24 MED ORDER — FUROSEMIDE 10 MG/ML IJ SOLN
40.0000 mg | Freq: Once | INTRAMUSCULAR | Status: AC
  Administered 2021-07-24: 40 mg via INTRAVENOUS
  Filled 2021-07-24: qty 4

## 2021-07-24 MED ORDER — CARVEDILOL 6.25 MG PO TABS
12.5000 mg | ORAL_TABLET | Freq: Two times a day (BID) | ORAL | Status: DC
  Administered 2021-07-25 – 2021-07-27 (×5): 12.5 mg via ORAL
  Filled 2021-07-24 (×5): qty 2

## 2021-07-24 MED ORDER — ATORVASTATIN CALCIUM 20 MG PO TABS
20.0000 mg | ORAL_TABLET | Freq: Every day | ORAL | Status: DC
  Administered 2021-07-24 – 2021-07-26 (×3): 20 mg via ORAL
  Filled 2021-07-24 (×3): qty 1

## 2021-07-24 NOTE — H&P (Signed)
History and Physical   LEANDRA VANDERWEELE EEF:[id number] DOB: Apr 09, 1946 DOA: 07/24/2021  PCP: Gladstone Lighter, MD  Outpatient Specialists: Dr. Nehemiah Massed Patient coming from: Shortness of breath  I have personally briefly reviewed patient's old medical records in [place].  Chief Concern: shortness of breath   HPI: BRYCELYN GAMBINO is a 75 y.o. female with medical history significant for chronic heart failure with preserved ejection fraction, hypertension, obesity, hyperlipidemia, non-insulin-dependent diabetes mellitus, who presents emergency department for chief concerns of shortness of breath.  She states that the shortness of breath gradual over the last month and worsened over the weekend, worse with ambulation and after eating. She reports she has never felt this way before. She reports she has gained about 10-12 pounds, with increase swelling in her legs.   She is not on home O2 [place] at this time. She reports she has been urinating more than usual. She denies dysuria, nausea, vomiting. She reports compliance with her fluid pills. She missed perhaps 1 dose over the last month.  She has had to sleep on recliner for the last month.  She reports worsening shortness of breath with laying flat and with exertion. She endorses dry cough, that has been present on/off for 3 weeks.   She endorses feeling epigastric fullness after eating, even a light meal. She states this has been ongoing for about 1 month. She reports these symptoms were not present prior to NM stress test in June 2022.   She denies changes to her diet.  Below is my review of her liquid intake on a regular basis:  Water: 16 oz per day Tea unsweetened, Splenda, caffeinated: 16 - 32 oz per day. She reports that if she drinks 32-40 oz of her tea that day, she does not drink any water because she counts the ice in the tea as water. Soda: not everyday, 10 oz, 2-3x per week Coffee: 8-10 oz per day Juice: none Milk:  none Soups: none Etoh: none  Social history: She lives with her husband. She denies tobacco use, recreational drug use. She infrequently drinks etoh. She is retired and formerly worked as a Network engineer.  Vaccination history: She is vaccinated for covid, two doses, no booster   ROS: Constitutional: + weight change, no fever ENT/Mouth: no sore throat, no rhinorrhea Eyes: no eye pain, no vision changes Cardiovascular: no chest pain, + dyspnea,  no edema, no palpitations Respiratory: + cough, no sputum, no wheezing Gastrointestinal: + nausea, no vomiting, no diarrhea, no constipation Genitourinary: no urinary incontinence, no dysuria, no hematuria Musculoskeletal: no arthralgias, no myalgias Skin: no skin lesions, no pruritus, Neuro: + weakness, no loss of consciousness, no syncope Psych: no anxiety, no depression, + decrease appetite Heme/Lymph: no bruising, no bleeding  ED Course: Discussed with emergency medicine provider, patient requiring hospitalization for chief concerns of heart failure exacerbation.  Vitals in the emergency department was remarkable for respiration rate of 18, heart rate of 73, blood pressure 148/68, SPO2 was 84% on 2 L nasal cannula, temperature 97.6.  Labs in the emergency department was remarkable for sodium 140, potassium 3.8, chloride 103, bicarb 30, BUN 44, serum creatinine of 1.78, GFR of 30, nonfasting blood glucose 138.  BNP was elevated at 198.4.  WBC was 6.3, hemoglobin 10.1, platelets 162.  High sensitive troponin was 8 and increased to 9.  Procalcitonin was negative.  COVID/influenza A/influenza B PCR were negative.  Assessment/Plan  Principal Problem:   Acute on chronic respiratory failure with hypoxia (HCC) Active  Problems:   Essential hypertension   Mixed hyperlipidemia   Type 2 diabetes mellitus with microalbuminuria, without long-term current use of insulin (HCC)   CKD (chronic kidney disease) stage 2, GFR 60-89 ml/min   Morbid obesity  (HCC)   Benign essential hypertension   Hyperlipidemia associated with type 2 diabetes mellitus (HCC)   Shortness of breath   AKI (acute kidney injury) ([place])   # Acute on chronic respiratory failure with hypoxia # Unintentional weight gain # Epigastric fullness that is a change from nuclear medicine stress test in June 2022 # Dyspnea with exertion - Etiology work-up in progress - Presumed secondary to heart failure exacerbation as patient's BNP is elevated and patient has been endorsing that she cannot lay in bed to sleep at night and has need of recliner to sleep.  She also endorses swelling in her lower extremities and weight gain unintentionally of approximately 15 to 20 pounds - TSH on 05/24/2021 was within normal limits and reported on labs as 5.296 - Check procalcitonin - High sensitive troponin was 8, will follow with the second value - Strict I's and O's - Cardiology, Dr. Nehemiah Massed has been consulted for medication optimization via Secure Chat - Complete echo has not been ordered at this time as patient had an echo in June 2022.  I ask cardiology via secure chat regarding the benefits of a repeat echo.  I am awaiting response. - Admit to medical floor, observation, telemetry  # Acute kidney injury-presumed secondary to cardiorenal - Treat as above - Serum creatinine on presentation is 1.78, EGFR of 30 - Baseline CKD 3A, serum creatinine range is 1.19-1.21, GFR 48-49 in 2022   # Morbid obesity # Patient likely has obstructive sleep apnea secondary to obesity - CPAP nightly ordered  # Hypertension-resumed home amlodipine 10 mg daily, carvedilol 12.5 mg p.o. twice daily - I am holding lisinopril and hydrochlorothiazide at this time due to acute kidney injury - Hydralazine 25 mg p.o. every 6 hours as needed for SBP greater than 180, 4 doses ordered  # Hyperlipidemia-resumed atorvastatin 20 mg nightly  # Non-insulin-dependent diabetes mellitus-glipizide and metformin has been held  at this time - Insulin SSI with at bedtime coverage ordered  Chart reviewed.   Complete echo on 03/28/2021 was read as ejection fraction greater than 55%, trivial MR, TR; normal left ventricular systolic function, normal right ventricular systolic function, no valvular stenosis  Nuclear medicine stress test in June 2022 was read as unremarkable  DVT prophylaxis: Heparin 5000 units subcutaneous every 8 hours Code Status: Full code Diet: Heart healthy/carb modified Family Communication: Updated spouse at bedside with patient's permission Disposition Plan: Pending clinical course Consults called: None at this time Admission status: MedSurg, observation, telemetry ordered for 24 hours  Past Medical History:  Diagnosis Date   Arthritis    Cataract    CKD (chronic kidney disease) stage 2, GFR 60-89 ml/min    Diabetes mellitus without complication ([place])    Hyperlipidemia    Hypertension    Morbid obesity ([place])    Osteoporosis March 2015   Proteinuria Jan 2015   referred to Nephrology   Past Surgical History:  Procedure Laterality Date   COLONOSCOPY WITH PROPOFOL N/A 07/16/2017   Procedure: COLONOSCOPY WITH PROPOFOL;  Surgeon: Robert Bellow, MD;  Location: [facility] ENDOSCOPY;  Service: Endoscopy;  Laterality: N/A;   [place] OF UTERUS  2007   ESOPHAGOGASTRODUODENOSCOPY (EGD) WITH PROPOFOL N/A 07/16/2017   Procedure: ESOPHAGOGASTRODUODENOSCOPY (EGD) WITH PROPOFOL;  Surgeon:  Robert Bellow, MD;  Location: [facility] ENDOSCOPY;  Service: Endoscopy;  Laterality: N/A;   MOUTH SURGERY  2007   cyst   TONSILLECTOMY     Social History:  reports that she has never smoked. She has never used smokeless tobacco. She reports that she does not drink alcohol and does not use drugs.  Allergies  Allergen Reactions   Nsaids Other (See Comments)   Penicillins Other (See Comments)    Not sure of reaction    Family History  Problem Relation Age of Onset   Glaucoma Mother    Cancer  Father        liver   Cancer Sister        lung, bone   COPD Sister    Heart disease Maternal Grandmother        chf   Hypertension Brother    Hypertension Maternal Uncle    Ovarian cancer Paternal Grandmother    Cancer Paternal Grandmother        ovarian cancer   Breast cancer Neg Hx    Family history: Family history reviewed and pertinent heart disease in maternal grandmother, hypertension in maternal uncle, brother.  Prior to Admission medications   Medication Sig Start Date End Date Taking? Authorizing Provider  alendronate (FOSAMAX) 70 MG tablet Take 1 tablet (70 mg total) by mouth once a week. 05/13/20   Delsa Grana, PA-C  amLODipine (NORVASC) 10 MG tablet TAKE 1 TABLET BY MOUTH  DAILY 09/12/20   Delsa Grana, PA-C  aspirin 81 MG tablet Take 81 mg by mouth daily.    [provider]  atorvastatin (LIPITOR) 40 MG tablet Take 0.5 tablets (20 mg total) by mouth at bedtime. 06/20/20   Delsa Grana, PA-C  bevacizumab (AVASTIN) 1.25 mg/0.1 mL SOLN 10 mg by Intravitreal route to Surgery.    [provider]  carvedilol (COREG) 6.25 MG tablet Take 6.25 mg by mouth 2 (two) times daily with a meal. 02/08/20 02/07/21  [provider]  Cholecalciferol 25 MCG (1000 UT) tablet Take 1,000 Units by mouth daily.    [provider]  furosemide (LASIX) 20 MG tablet Take 1 tablet (20 mg total) by mouth daily. 01/21/21   Coral Spikes, DO  glipiZIDE (GLUCOTROL XL) 5 MG 24 hr tablet Take 5 mg by mouth 2 (two) times daily. 11/18/20   [provider]  hydrochlorothiazide (HYDRODIURIL) 25 MG tablet TAKE 1 TABLET BY MOUTH  DAILY 05/15/21   Delsa Grana, PA-C  lisinopril (ZESTRIL) 40 MG tablet Take 1 tablet (40 mg total) by mouth daily. 05/17/20   Delsa Grana, PA-C  metFORMIN (GLUCOPHAGE) 500 MG tablet TAKE 2 TABLETS BY MOUTH TWO TIMES DAILY 07/28/18   Arnetha Courser, MD  Multiple Vitamin (MULTI-VITAMINS) TABS Take by mouth daily.     [provider]  Omega-3 Fatty  Acids (FISH OIL) 1000 MG CAPS Take 1,000 mg by mouth 2 (two) times daily.    [provider]  [facility] VERIO test strip 1 each daily. 03/26/20   [provider]   Physical Exam: Vitals:   07/24/21 1137 07/24/21 1138  BP: (!) 148/68   Pulse: 73   Resp: 18   SpO2: (!) 88%   Weight:  127.5 kg  Height:  $Remove'5\' 4"'UVdyyoo$  (1.626 m)   Constitutional: appears age-appropriate, NAD, calm, comfortable Eyes: PERRL, lids and conjunctivae normal ENMT: Mucous membranes are moist. Posterior pharynx clear of any exudate or lesions. Age-appropriate dentition. Hearing appropriate Neck: normal, supple, no masses,  no thyromegaly Respiratory: clear to auscultation bilaterally, no wheezing, no crackles. Normal respiratory effort. No accessory muscle use.  Cardiovascular: Regular rate and rhythm, no murmurs / rubs / gallops.  Bilateral 3+ pitting extremity edema. 2+ pedal pulses. No carotid bruits.  Abdomen: Morbidly obese, no tenderness, no masses palpated, no hepatosplenomegaly. Bowel sounds positive.  Musculoskeletal: no clubbing / cyanosis. No joint deformity upper and lower extremities. Good ROM, no contractures, no atrophy. Normal muscle tone.  Skin: no rashes, lesions, ulcers. No induration Neurologic: Sensation intact. Strength 5/5 in all 4.  Psychiatric: Normal judgment and insight. Alert and oriented x 3. Normal mood.   EKG: independently reviewed, showing sinus rate 75, QTc 451  Chest x-ray on Admission: I personally reviewed and I agree with radiologist reading as below.  DG Chest 2 View  Result Date: 07/24/2021 CLINICAL DATA:  75 year old female with history of shortness of breath. EXAM: CHEST - 2 VIEW COMPARISON:  Chest x-ray 01/21/2021. FINDINGS: Lung volumes are low. There is cephalization of the pulmonary vasculature, indistinctness of the interstitial markings, and patchy airspace disease throughout the lungs bilaterally suggestive of moderate pulmonary edema. Small bilateral pleural  effusions. Heart size is mildly enlarged. Dilatation of the central pulmonary arteries. Upper mediastinal contours are within normal limits. Atherosclerotic calcifications in the thoracic aorta. IMPRESSION: 1. The appearance the chest suggest congestive heart failure, as above. 2. Marked dilatation of the central pulmonary arteries, concerning for pulmonary arterial hypertension. 3. Aortic atherosclerosis. Electronically Signed   By: Vinnie Langton M.D.   On: 07/24/2021 12:36    Labs on Admission: I have personally reviewed following labs  CBC: Recent Labs  Lab 07/24/21 1140  WBC 6.7  HGB 10.1*  HCT 33.4*  MCV 86.5  PLT 778   Basic Metabolic Panel: Recent Labs  Lab 07/24/21 1140  NA 140  K 3.9  CL 103  CO2 30  GLUCOSE 138*  BUN 44*  CREATININE 1.78*  CALCIUM 9.0   GFR: Estimated Creatinine Clearance: 37.2 mL/min (A) (by C-G formula based on SCr of 1.78 mg/dL (H)).  Urine analysis:    Component Value Date/Time   COLORURINE YELLOW (A) 02/11/2021 1839   APPEARANCEUR HAZY (A) 02/11/2021 1839   LABSPEC 1.015 02/11/2021 1839   PHURINE 5.0 02/11/2021 1839   GLUCOSEU 50 (A) 02/11/2021 1839   HGBUR SMALL (A) 02/11/2021 1839   BILIRUBINUR NEGATIVE 02/11/2021 1839   KETONESUR NEGATIVE 02/11/2021 1839   PROTEINUR >=300 (A) 02/11/2021 1839   NITRITE NEGATIVE 02/11/2021 1839   LEUKOCYTESUR NEGATIVE 02/11/2021 1839   Dr. Tobie Poet [facility]  If 7PM-7AM, please contact overnight-coverage provider If 7AM-7PM, please contact day coverage provider [email or web address]  07/24/2021, 2:13 PM

## 2021-07-24 NOTE — ED Triage Notes (Signed)
Pt was sent over from [facility] for further eval, pt was being seen as a recheck, and sent over due to having low o2 s[place], pt was 83% on RA, pt reaches 88% on RA once her mask removed. Pt is able to speak well without struggling to breathe

## 2021-07-24 NOTE — ED Provider Notes (Signed)
[facility] Emergency Department Provider Note   ____________________________________________   Event Date/Time   First MD Initiated Contact with Patient 07/24/21 1325     (approximate)  I have reviewed the triage vital signs and the nursing notes.   HISTORY  Chief Complaint Shortness of Breath    HPI Julie [name] is a 75 y.o. female who presents for shortness of breath from clinic  LOCATION: Chest DURATION: 3 weeks prior to arrival TIMING: Worsening since onset SEVERITY: Severe QUALITY: Shortness of breath CONTEXT: Patient states that she has been noticing worsening shortness of breath especially on exertion as well as increasing bilateral lower extremity edema over the past 3 weeks MODIFYING FACTORS: Exertion worsens the shortness of breath and is partially relieved at rest. ASSOCIATED SYMPTOMS: Bilateral lower extremity edema   Per medical record review, patient does not have any documented evidence of CHF or CAD          Past Medical History:  Diagnosis Date   Arthritis    Cataract    CKD (chronic kidney disease) stage 2, GFR 60-89 ml/min    Diabetes mellitus without complication ([place])    Hyperlipidemia    Hypertension    Morbid obesity ([place])    Osteoporosis March 2015   Proteinuria Jan 2015   referred to Nephrology    Patient Active Problem List   Diagnosis Date Noted   Shortness of breath 07/24/2021   AKI (acute kidney injury) ([place]) 07/24/2021   Acute on chronic respiratory failure with hypoxia ([place]) 07/24/2021   Mixed hyperlipidemia 09/29/2019   Benign essential hypertension 09/29/2019   Bilateral lower extremity edema 07/08/2019   Essential hypertension 07/29/2018   Type 2 diabetes mellitus with microalbuminuria, without long-term current use of insulin ([place]) 07/29/2018   Age-related osteoporosis without current pathological fracture 07/29/2018   Hyperlipidemia associated with type 2 diabetes mellitus ([place])  07/29/2018   Stool guaiac positive 06/03/2017   Dermatitis 01/04/2017   Low back pain 06/06/2016   Medication monitoring encounter 11/30/2015   Varicose vein of leg 11/30/2015   Abnormal mammogram 08/04/2015   Cardiac murmur 07/28/2015   CKD (chronic kidney disease) stage 2, GFR 60-89 ml/min    Morbid obesity ([place])    Osteoporosis    Proteinuria 10/15/2013    Past Surgical History:  Procedure Laterality Date   COLONOSCOPY WITH PROPOFOL N/A 07/16/2017   Procedure: COLONOSCOPY WITH PROPOFOL;  Surgeon: Robert Bellow, MD;  Location: [facility] ENDOSCOPY;  Service: Endoscopy;  Laterality: N/A;   [place] OF UTERUS  2007   ESOPHAGOGASTRODUODENOSCOPY (EGD) WITH PROPOFOL N/A 07/16/2017   Procedure: ESOPHAGOGASTRODUODENOSCOPY (EGD) WITH PROPOFOL;  Surgeon: Robert Bellow, MD;  Location: [facility] ENDOSCOPY;  Service: Endoscopy;  Laterality: N/A;   MOUTH SURGERY  2007   cyst   TONSILLECTOMY      Prior to Admission medications   Medication Sig Start Date End Date Taking? Authorizing Provider  alendronate (FOSAMAX) 70 MG tablet Take 1 tablet (70 mg total) by mouth once a week. 05/13/20  Yes Delsa Grana, PA-C  amLODipine (NORVASC) 10 MG tablet TAKE 1 TABLET BY MOUTH  DAILY 09/12/20  Yes Delsa Grana, PA-C  aspirin 81 MG tablet Take 81 mg by mouth daily.   Yes [provider]  atorvastatin (LIPITOR) 40 MG tablet Take 0.5 tablets (20 mg total) by mouth at bedtime. 06/20/20  Yes Delsa Grana, PA-C  carvedilol (COREG) 12.5 MG tablet Take 12.5 mg by mouth 2 (two) times daily with a meal.  04/13/21 04/13/22 Yes [provider]  Cholecalciferol 25 MCG (1000 UT) tablet Take 1,000 Units by mouth daily.   Yes [provider]  furosemide (LASIX) 40 MG tablet Take 40 mg by mouth daily. 07/07/21 07/07/22 Yes [provider]  glipiZIDE (GLUCOTROL XL) 10 MG 24 hr tablet Take 10 mg by mouth 2 (two) times daily. 11/18/20  Yes [provider]  lisinopril (ZESTRIL)  40 MG tablet Take 1 tablet (40 mg total) by mouth daily. 05/17/20  Yes Delsa Grana, PA-C  metFORMIN (GLUCOPHAGE) 500 MG tablet TAKE 2 TABLETS BY MOUTH TWO TIMES DAILY 07/28/18  Yes Lada, Satira Anis, MD  Multiple Vitamin (MULTI-VITAMINS) TABS Take by mouth daily.    Yes [provider]  Omega-3 Fatty Acids (FISH OIL) 1000 MG CAPS Take 1,000 mg by mouth 2 (two) times daily.   Yes [provider]  bevacizumab (AVASTIN) 1.25 mg/0.1 mL SOLN 10 mg by Intravitreal route to Surgery. Patient not taking: Reported on 07/24/2021    [provider]  carvedilol (COREG) 6.25 MG tablet Take 6.25 mg by mouth 2 (two) times daily with a meal. 02/08/20 02/07/21  [provider]  furosemide (LASIX) 20 MG tablet Take 1 tablet (20 mg total) by mouth daily. Patient not taking: Reported on 07/24/2021 01/21/21   Coral Spikes, DO  hydrochlorothiazide (HYDRODIURIL) 25 MG tablet TAKE 1 TABLET BY MOUTH  DAILY 05/15/21   Delsa Grana, PA-C  ONETOUCH VERIO test strip 1 each daily. 03/26/20   [provider]    Allergies Nsaids and Penicillins  Family History  Problem Relation Age of Onset   Glaucoma Mother    Cancer Father        liver   Cancer Sister        lung, bone   COPD Sister    Heart disease Maternal Grandmother        chf   Hypertension Brother    Hypertension Maternal Uncle    Ovarian cancer Paternal Grandmother    Cancer Paternal Grandmother        ovarian cancer   Breast cancer Neg Hx     Social History Social History   Tobacco Use   Smoking status: Never   Smokeless tobacco: Never   Tobacco comments:    smoking cessation materials not required  Vaping Use   Vaping Use: Never used  Substance Use Topics   Alcohol use: No   Drug use: No    Review of Systems Constitutional: No fever/chills Eyes: No visual changes. ENT: No sore throat. Cardiovascular: Denies chest pain. Respiratory: Endorses shortness of breath. Gastrointestinal: No abdominal pain.   No nausea, no vomiting.  No diarrhea. Genitourinary: Negative for dysuria. Musculoskeletal: Negative for acute arthralgias Skin: Negative for rash.  Endorses bilateral lower extremity edema Neurological: Negative for headaches, weakness/numbness/paresthesias in any extremity Psychiatric: Negative for suicidal ideation/homicidal ideation   ____________________________________________   PHYSICAL EXAM:  VITAL SIGNS: ED Triage Vitals  Enc Vitals Group     BP 07/24/21 1137 (!) 148/68     Pulse Rate 07/24/21 1137 73     Resp 07/24/21 1137 18     Temp 07/24/21 1424 97.6 F (36.4 C)     Temp Source 07/24/21 1424 Oral     SpO2 07/24/21 1137 (!) 88 %     Weight 07/24/21 1138 281 lb (127.5 kg)     Height 07/24/21 1138 5\' 4"  (1.626 m)     Head Circumference --  Peak Flow --      Pain Score 07/24/21 1138 0     Pain Loc --      Pain Edu? --      Excl. in [place]? --    Constitutional: Alert and oriented. Well appearing and in no acute distress. Eyes: Conjunctivae are normal. PERRL. Head: Atraumatic. Nose: No congestion/rhinnorhea. Mouth/Throat: Mucous membranes are moist. Neck: No stridor Cardiovascular: Grossly normal heart sounds.  Good peripheral circulation. Respiratory: Normal respiratory effort.  No retractions. Gastrointestinal: Soft and nontender. No distention. Musculoskeletal: No obvious deformities Neurologic:  Normal speech and language. No gross focal neurologic deficits are appreciated. Skin: 2+ pitting bilateral lower extremity edema to the knee.  Skin is warm and dry. No rash noted. Psychiatric: Mood and affect are normal. Speech and behavior are normal.  ____________________________________________   LABS (all labs ordered are listed, but only abnormal results are displayed)  Labs Reviewed  BASIC METABOLIC PANEL - Abnormal; Notable for the following components:      Result Value   Glucose, Bld 138 (*)    BUN 44 (*)    Creatinine, Ser 1.78 (*)    GFR,  Estimated 30 (*)    All other components within normal limits  CBC - Abnormal; Notable for the following components:   RBC 3.86 (*)    Hemoglobin 10.1 (*)    HCT 33.4 (*)    RDW 18.6 (*)    nRBC 0.3 (*)    All other components within normal limits  BRAIN NATRIURETIC PEPTIDE - Abnormal; Notable for the following components:   B Natriuretic Peptide 198.4 (*)    All other components within normal limits  RESP PANEL BY RT-PCR (FLU A&B, COVID) ARPGX2  PROCALCITONIN  HEMOGLOBIN A1C  TROPONIN I (HIGH SENSITIVITY)  TROPONIN I (HIGH SENSITIVITY)   ____________________________________________  EKG  ED ECG REPORT I, Naaman Plummer, the attending physician, personally viewed and interpreted this ECG.  Date: 07/24/2021 EKG Time: 1139 Rate: 75 Rhythm: normal sinus rhythm QRS Axis: normal Intervals: normal ST/T Wave abnormalities: normal Narrative Interpretation: no evidence of acute ischemia  ____________________________________________  RADIOLOGY  ED MD interpretation: 2 view chest x-ray shows moderate pulmonary edema and small bilateral pleural effusions suggestive of congestive heart failure as well as dilation of the central pulmonary arteries concerning for pulmonary arterial hypertension  Official radiology report(s): DG Chest 2 View  Result Date: 07/24/2021 CLINICAL DATA:  75 year old female with history of shortness of breath. EXAM: CHEST - 2 VIEW COMPARISON:  Chest x-ray 01/21/2021. FINDINGS: Lung volumes are low. There is cephalization of the pulmonary vasculature, indistinctness of the interstitial markings, and patchy airspace disease throughout the lungs bilaterally suggestive of moderate pulmonary edema. Small bilateral pleural effusions. Heart size is mildly enlarged. Dilatation of the central pulmonary arteries. Upper mediastinal contours are within normal limits. Atherosclerotic calcifications in the thoracic aorta. IMPRESSION: 1. The appearance the chest suggest  congestive heart failure, as above. 2. Marked dilatation of the central pulmonary arteries, concerning for pulmonary arterial hypertension. 3. Aortic atherosclerosis. Electronically Signed   By: Vinnie Langton M.D.   On: 07/24/2021 12:36    ____________________________________________   PROCEDURES  Procedure(s) performed (including Critical Care):  .1-3 Lead EKG Interpretation Performed by: Naaman Plummer, MD Authorized by: Naaman Plummer, MD     Interpretation: normal     ECG rate:  81   ECG rate assessment: normal     Rhythm: sinus rhythm     Ectopy: none  Conduction: normal    CRITICAL CARE Performed by: Naaman Plummer   Total critical care time: 33 minutes  Critical care time was exclusive of separately billable procedures and treating other patients.  Critical care was necessary to treat or prevent imminent or life-threatening deterioration.  Critical care was time spent personally by me on the following activities: development of treatment plan with patient and/or surrogate as well as nursing, discussions with consultants, evaluation of patient's response to treatment, examination of patient, obtaining history from patient or surrogate, ordering and performing treatments and interventions, ordering and review of laboratory studies, ordering and review of radiographic studies, pulse oximetry and re-evaluation of patient's condition.  ____________________________________________   INITIAL IMPRESSION / ASSESSMENT AND PLAN / ED COURSE  As part of my medical decision making, I reviewed the following data within the electronic medical record, if available:  Nursing notes reviewed and incorporated, Labs reviewed, EKG interpreted, Old chart reviewed, Radiograph reviewed and Notes from prior ED visits reviewed and incorporated        Endorses dyspnea endorses LE edema Denies Non adherence to medication regimen  Workup: ECG, CBC, BMP, Troponin, BNP,  CXR Findings: EKG: No STEMI and no evidence of Brugadas sign, delta wave, epsilon wave, significantly prolonged QTc, or malignant arrhythmia. BNP: 198 CXR: 2 view chest x-ray shows bilateral pulmonary edema and pleural effusions Based on history, exam and findings, presentation most consistent with acute on chronic heart failure. Low suspicion for PNA, ACS, tamponade, aortic dissection. Interventions: Oxygen, Diuresis  Reassessment: Symptoms improved in ED with oxygen and diuresis  Disposition (Stable but not significantly improved): Admit to medicine for further monitoring and for improvement of medication regimen to control symptoms.      ____________________________________________   FINAL CLINICAL IMPRESSION(S) / ED DIAGNOSES  Final diagnoses:  Acute on chronic congestive heart failure, unspecified heart failure type (HCC)  SOB (shortness of breath)  Acute respiratory failure with hypoxia [facility])     ED Discharge Orders     None        Note:  This document was prepared using Dragon voice recognition software and may include unintentional dictation errors.    Naaman Plummer, MD 07/24/21 [phone]

## 2021-07-25 DIAGNOSIS — I130 Hypertensive heart and chronic kidney disease with heart failure and stage 1 through stage 4 chronic kidney disease, or unspecified chronic kidney disease: Secondary | ICD-10-CM | POA: Diagnosis present

## 2021-07-25 DIAGNOSIS — Z7983 Long term (current) use of bisphosphonates: Secondary | ICD-10-CM | POA: Diagnosis not present

## 2021-07-25 DIAGNOSIS — Z20822 Contact with and (suspected) exposure to covid-19: Secondary | ICD-10-CM | POA: Diagnosis present

## 2021-07-25 DIAGNOSIS — E1169 Type 2 diabetes mellitus with other specified complication: Secondary | ICD-10-CM | POA: Diagnosis present

## 2021-07-25 DIAGNOSIS — E1122 Type 2 diabetes mellitus with diabetic chronic kidney disease: Secondary | ICD-10-CM | POA: Diagnosis present

## 2021-07-25 DIAGNOSIS — D649 Anemia, unspecified: Secondary | ICD-10-CM | POA: Diagnosis not present

## 2021-07-25 DIAGNOSIS — N1831 Chronic kidney disease, stage 3a: Secondary | ICD-10-CM | POA: Diagnosis present

## 2021-07-25 DIAGNOSIS — Z8249 Family history of ischemic heart disease and other diseases of the circulatory system: Secondary | ICD-10-CM | POA: Diagnosis not present

## 2021-07-25 DIAGNOSIS — R0602 Shortness of breath: Secondary | ICD-10-CM | POA: Diagnosis present

## 2021-07-25 DIAGNOSIS — Z880 Allergy status to penicillin: Secondary | ICD-10-CM | POA: Diagnosis not present

## 2021-07-25 DIAGNOSIS — D509 Iron deficiency anemia, unspecified: Secondary | ICD-10-CM | POA: Diagnosis not present

## 2021-07-25 DIAGNOSIS — I5033 Acute on chronic diastolic (congestive) heart failure: Secondary | ICD-10-CM

## 2021-07-25 DIAGNOSIS — Z6841 Body Mass Index (BMI) 40.0 and over, adult: Secondary | ICD-10-CM | POA: Diagnosis not present

## 2021-07-25 DIAGNOSIS — J9621 Acute and chronic respiratory failure with hypoxia: Secondary | ICD-10-CM | POA: Diagnosis present

## 2021-07-25 DIAGNOSIS — M1990 Unspecified osteoarthritis, unspecified site: Secondary | ICD-10-CM | POA: Diagnosis present

## 2021-07-25 DIAGNOSIS — N179 Acute kidney failure, unspecified: Secondary | ICD-10-CM | POA: Diagnosis present

## 2021-07-25 DIAGNOSIS — M810 Age-related osteoporosis without current pathological fracture: Secondary | ICD-10-CM | POA: Diagnosis present

## 2021-07-25 DIAGNOSIS — I509 Heart failure, unspecified: Secondary | ICD-10-CM

## 2021-07-25 DIAGNOSIS — E782 Mixed hyperlipidemia: Secondary | ICD-10-CM | POA: Diagnosis present

## 2021-07-25 DIAGNOSIS — Z7982 Long term (current) use of aspirin: Secondary | ICD-10-CM | POA: Diagnosis not present

## 2021-07-25 DIAGNOSIS — J9601 Acute respiratory failure with hypoxia: Secondary | ICD-10-CM | POA: Diagnosis not present

## 2021-07-25 DIAGNOSIS — Z7984 Long term (current) use of oral hypoglycemic drugs: Secondary | ICD-10-CM | POA: Diagnosis not present

## 2021-07-25 DIAGNOSIS — Z23 Encounter for immunization: Secondary | ICD-10-CM | POA: Diagnosis present

## 2021-07-25 DIAGNOSIS — Z83511 Family history of glaucoma: Secondary | ICD-10-CM | POA: Diagnosis not present

## 2021-07-25 DIAGNOSIS — Z886 Allergy status to analgesic agent status: Secondary | ICD-10-CM | POA: Diagnosis not present

## 2021-07-25 DIAGNOSIS — Z825 Family history of asthma and other chronic lower respiratory diseases: Secondary | ICD-10-CM | POA: Diagnosis not present

## 2021-07-25 DIAGNOSIS — G4733 Obstructive sleep apnea (adult) (pediatric): Secondary | ICD-10-CM | POA: Diagnosis present

## 2021-07-25 DIAGNOSIS — Z79899 Other long term (current) drug therapy: Secondary | ICD-10-CM | POA: Diagnosis not present

## 2021-07-25 LAB — CBC
HCT: 31.9 % — ABNORMAL LOW (ref 36.0–46.0)
Hemoglobin: 9.7 g/dL — ABNORMAL LOW (ref 12.0–15.0)
MCH: 26.3 pg (ref 26.0–34.0)
MCHC: 30.4 g/dL (ref 30.0–36.0)
MCV: 86.4 fL (ref 80.0–100.0)
Platelets: 155 10*3/uL (ref 150–400)
RBC: 3.69 MIL/uL — ABNORMAL LOW (ref 3.87–5.11)
RDW: 18.4 % — ABNORMAL HIGH (ref 11.5–15.5)
WBC: 7.3 10*3/uL (ref 4.0–10.5)
nRBC: 0 % (ref 0.0–0.2)

## 2021-07-25 LAB — BASIC METABOLIC PANEL
Anion gap: 9 (ref 5–15)
BUN: 41 mg/dL — ABNORMAL HIGH (ref 8–23)
CO2: 27 mmol/L (ref 22–32)
Calcium: 8.7 mg/dL — ABNORMAL LOW (ref 8.9–10.3)
Chloride: 105 mmol/L (ref 98–111)
Creatinine, Ser: 1.74 mg/dL — ABNORMAL HIGH (ref 0.44–1.00)
GFR, Estimated: 31 mL/min — ABNORMAL LOW (ref >60)
Glucose, Bld: 126 mg/dL — ABNORMAL HIGH (ref 70–99)
Potassium: 3.9 mmol/L (ref 3.5–5.1)
Sodium: 141 mmol/L (ref 135–145)

## 2021-07-25 LAB — GLUCOSE, CAPILLARY
Glucose-Capillary: 149 mg/dL — ABNORMAL HIGH (ref 70–99)
Glucose-Capillary: 173 mg/dL — ABNORMAL HIGH (ref 70–99)
Glucose-Capillary: 120 mg/dL — ABNORMAL HIGH (ref 70–99)
Glucose-Capillary: 146 mg/dL — ABNORMAL HIGH (ref 70–99)

## 2021-07-25 NOTE — Consult Note (Addendum)
[facility] Consultation Note  Patient ID: Julie [name], MRN: [id number], DOB/AGE: [birth year] 75 y.o. Admit date: 07/24/2021   Date of Consult: 07/25/2021 Primary Physician: Gladstone Lighter, MD Primary Cardiologist: Dr. Nehemiah Massed  Chief Complaint:  Chief Complaint  Patient presents with  . Shortness of Breath   Reason for Consult:  Acute on chronic respiratory failure with hypoxia  HPI: 75 y.o. female with a past medical history of HFpEF, hypertension, obesity, hyperlipidemia, type 2 diabetes.  Patient had presented to the ED with complaints of shortness of breath worsening over the last month and worse with ambulation and endorses orthopnea over the last week requiring her to use a recliner to sleep in.  Patient does report a recent 10 pound weight gain with worsening lower extremity edema.  She reports compliance with her home medications including her diuretics.  Patient also has a history of chronic kidney disease with current creatinine of 1.74 and GFR of 31, also with anemia and hemoglobin of 9.7.  CXR with moderate pulmonary edema and small bilateral pleural effusions consistent with congestive heart failure.    Past Medical History:  Diagnosis Date  . Arthritis   . Cataract   . CKD (chronic kidney disease) stage 2, GFR 60-89 ml/min   . Diabetes mellitus without complication ([place])   . Hyperlipidemia   . Hypertension   . Morbid obesity ([place])   . Osteoporosis March 2015  . Proteinuria Jan 2015   referred to Nephrology      Surgical History:  Past Surgical History:  Procedure Laterality Date  . COLONOSCOPY WITH PROPOFOL N/A 07/16/2017   Procedure: COLONOSCOPY WITH PROPOFOL;  Surgeon: Robert Bellow, MD;  Location: [facility] ENDOSCOPY;  Service: Endoscopy;  Laterality: N/A;  . DILATION AND CURETTAGE OF UTERUS  2007  . ESOPHAGOGASTRODUODENOSCOPY (EGD) WITH PROPOFOL N/A 07/16/2017   Procedure: ESOPHAGOGASTRODUODENOSCOPY (EGD) WITH PROPOFOL;  Surgeon: Robert Bellow, MD;  Location: [facility] ENDOSCOPY;  Service: Endoscopy;  Laterality: N/A;  . MOUTH SURGERY  2007   cyst  . TONSILLECTOMY       Home Meds: Prior to Admission medications   Medication Sig Start Date End Date Taking? Authorizing Provider  alendronate (FOSAMAX) 70 MG tablet Take 1 tablet (70 mg total) by mouth once a week. 05/13/20  Yes Delsa Grana, PA-C  amLODipine (NORVASC) 10 MG tablet TAKE 1 TABLET BY MOUTH  DAILY 09/12/20  Yes Delsa Grana, PA-C  aspirin 81 MG tablet Take 81 mg by mouth daily.   Yes [provider]  atorvastatin (LIPITOR) 40 MG tablet Take 0.5 tablets (20 mg total) by mouth at bedtime. 06/20/20  Yes Delsa Grana, PA-C  carvedilol (COREG) 12.5 MG tablet Take 12.5 mg by mouth 2 (two) times daily with a meal. 04/13/21 04/13/22 Yes [provider]  Cholecalciferol 25 MCG (1000 UT) tablet Take 1,000 Units by mouth daily.   Yes [provider]  furosemide (LASIX) 40 MG tablet Take 40 mg by mouth daily. 07/07/21 07/07/22 Yes [provider]  glipiZIDE (GLUCOTROL XL) 10 MG 24 hr tablet Take 10 mg by mouth 2 (two) times daily. 11/18/20  Yes [provider]  lisinopril (ZESTRIL) 40 MG tablet Take 1 tablet (40 mg total) by mouth daily. 05/17/20  Yes Delsa Grana, PA-C  metFORMIN (GLUCOPHAGE) 500 MG tablet TAKE 2 TABLETS BY MOUTH TWO TIMES DAILY 07/28/18  Yes Lada, Satira Anis, MD  Multiple Vitamin (MULTI-VITAMINS) TABS Take by mouth daily.    Yes [provider]  Omega-3  Fatty Acids (FISH OIL) 1000 MG CAPS Take 1,000 mg by mouth 2 (two) times daily.   Yes [provider]  bevacizumab (AVASTIN) 1.25 mg/0.1 mL SOLN 10 mg by Intravitreal route to Surgery. Patient not taking: Reported on 07/24/2021    [provider]  carvedilol (COREG) 6.25 MG tablet Take 6.25 mg by mouth 2 (two) times daily with a meal. 02/08/20 02/07/21  [provider]  furosemide (LASIX) 20 MG tablet Take 1 tablet (20 mg total) by mouth  daily. Patient not taking: Reported on 07/24/2021 01/21/21   Coral Spikes, DO  hydrochlorothiazide (HYDRODIURIL) 25 MG tablet TAKE 1 TABLET BY MOUTH  DAILY 05/15/21   Delsa Grana, PA-C  ONETOUCH VERIO test strip 1 each daily. 03/26/20   [provider]    Inpatient Medications:  . amLODipine  10 mg Oral Daily  . atorvastatin  20 mg Oral QHS  . carvedilol  12.5 mg Oral BID WC  . furosemide  40 mg Intravenous Daily  . heparin  5,000 Units Subcutaneous Q8H  . insulin aspart  0-15 Units Subcutaneous TID WC  . insulin aspart  0-5 Units Subcutaneous QHS     Allergies:  Allergies  Allergen Reactions  . Nsaids Other (See Comments)    CKD  . Penicillins Other (See Comments)    Not sure of reaction     Social History   Socioeconomic History  . Marital status: Married    Spouse name: Rosita Fire  . Number of children: 1  . Years of education: some college  . Highest education level: 12th grade  Occupational History  . Occupation: Retired  Tobacco Use  . Smoking status: Never  . Smokeless tobacco: Never  . Tobacco comments:    smoking cessation materials not required  Vaping Use  . Vaping Use: Never used  Substance and Sexual Activity  . Alcohol use: No  . Drug use: No  . Sexual activity: Not Currently  Other Topics Concern  . Not on file  Social History Narrative  . Not on file   Social Determinants of Health   Financial Resource Strain: Not on file  Food Insecurity: Not on file  Transportation Needs: Not on file  Physical Activity: Not on file  Stress: Not on file  Social Connections: Not on file  Intimate Partner Violence: Not on file     Family History  Problem Relation Age of Onset  . Glaucoma Mother   . Cancer Father        liver  . Cancer Sister        lung, bone  . COPD Sister   . Heart disease Maternal Grandmother        chf  . Hypertension Brother   . Hypertension Maternal Uncle   . Ovarian cancer Paternal Grandmother   . Cancer Paternal  Grandmother        ovarian cancer  . Breast cancer Neg Hx      Review of Systems Positive for shortness of breath, lower extremity edema Negative for: General:  chills, fever, night sweats or weight changes.  Cardiovascular: PND orthopnea syncope dizziness  Dermatological skin lesions rashes Respiratory: Cough congestion Urologic: Frequent urination urination at night and hematuria Abdominal: negative for nausea, vomiting, diarrhea, bright red blood per rectum, melena, or hematemesis Neurologic: negative for visual changes, and/or hearing changes  All other systems reviewed and are otherwise negative except as noted above.  Labs: No results for input(s): CKTOTAL, CKMB, TROPONINI in the last 72  hours. Lab Results  Component Value Date   WBC 7.3 07/25/2021   HGB 9.7 (L) 07/25/2021   HCT 31.9 (L) 07/25/2021   MCV 86.4 07/25/2021   PLT 155 07/25/2021    Recent Labs  Lab 07/25/21 0502  NA 141  K 3.9  CL 105  CO2 27  BUN 41*  CREATININE 1.74*  CALCIUM 8.7*  GLUCOSE 126*   Lab Results  Component Value Date   CHOL 135 11/10/2019   HDL 42 (L) 11/10/2019   LDLCALC 75 11/10/2019   TRIG 98 11/10/2019   No results found for: DDIMER  Radiology/Studies:  DG Chest 2 View  Result Date: 07/24/2021 CLINICAL DATA:  75 year old female with history of shortness of breath. EXAM: CHEST - 2 VIEW COMPARISON:  Chest x-ray 01/21/2021. FINDINGS: Lung volumes are low. There is cephalization of the pulmonary vasculature, indistinctness of the interstitial markings, and patchy airspace disease throughout the lungs bilaterally suggestive of moderate pulmonary edema. Small bilateral pleural effusions. Heart size is mildly enlarged. Dilatation of the central pulmonary arteries. Upper mediastinal contours are within normal limits. Atherosclerotic calcifications in the thoracic aorta. IMPRESSION: 1. The appearance the chest suggest congestive heart failure, as above. 2. Marked dilatation of the  central pulmonary arteries, concerning for pulmonary arterial hypertension. 3. Aortic atherosclerosis. Electronically Signed   By: Vinnie Langton M.D.   On: 07/24/2021 12:36   CT Angio Chest Pulmonary Embolism (PE) W or WO Contrast  Result Date: 07/07/2021 CLINICAL DATA:  Shortness of breath EXAM: CT ANGIOGRAPHY CHEST WITH CONTRAST TECHNIQUE: Multidetector CT imaging of the chest was performed using the standard protocol during bolus administration of intravenous contrast. Multiplanar CT image reconstructions and MIPs were obtained to evaluate the vascular anatomy. CONTRAST:  45mL OMNIPAQUE IOHEXOL 350 MG/ML SOLN COMPARISON:  None. FINDINGS: Cardiovascular: Examination is somewhat limited by motion artifact throughout. Within this limitation, no evidence of pulmonary embolism through the proximal segmental pulmonary arterial level. Normal heart size. Extensive 3 vessel coronary artery calcifications. No pericardial effusion. Aortic atherosclerosis. Mediastinum/Nodes: No enlarged mediastinal, hilar, or axillary lymph nodes. Thyroid gland, trachea, and esophagus demonstrate no significant findings. Lungs/Pleura: Small right pleural effusion associated atelectasis or consolidation. Interlobular septal thickening and mosaic attenuation throughout the airspaces. Scarring and volume loss of the left lung base. Upper Abdomen: No acute abnormality. Musculoskeletal: No chest wall abnormality. No acute or significant osseous findings. Review of the MIP images confirms the above findings. IMPRESSION: 1. Examination is somewhat limited by motion artifact throughout. Within this limitation, no evidence of pulmonary embolism through the proximal segmental pulmonary arterial level. 2. Small right pleural effusion and associated atelectasis or consolidation. 3. Interlobular septal thickening and mosaic attenuation throughout the airspaces, this appearance suggesting a combination of pulmonary edema and small airways disease.  4. Coronary artery disease. Aortic Atherosclerosis (ICD10-I70.0). Electronically Signed   By: Eddie Candle M.D.   On: 07/07/2021 16:20    EKG: normal sinus rhythm  Weights: Filed Weights   07/24/21 1138  Weight: 127.5 kg     Physical Exam: Blood pressure 127/65, pulse 76, temperature 98.1 F (36.7 C), resp. rate 16, height 5\' 4"  (1.626 m), weight 127.5 kg, SpO2 91 %. Body mass index is 48.23 kg/m. General: Well developed, well nourished, in no acute distress. Head eyes ears nose throat: Normocephalic, atraumatic, sclera non-icteric, no xanthomas, nares are without discharge. No apparent thyromegaly and/or mass  Lungs: Normal respiratory effort.  no wheezes, no rales, no rhonchi.  Heart: RRR with normal S1 S2. no murmur  gallop, no rub, PMI is normal size and placement, carotid upstroke normal without bruit, jugular venous pressure is normal Abdomen: Soft, non-tender, non-distended with normoactive bowel sounds. No hepatomegaly. No rebound/guarding. No obvious abdominal masses. Abdominal aorta is normal size without bruit Extremities: No edema. no cyanosis, no clubbing, no ulcers  Peripheral : 2+ bilateral upper extremity pulses, 2+ bilateral femoral pulses, 2+ bilateral dorsal pedal pulse Neuro: Alert and oriented. No facial asymmetry. No focal deficit. Moves all extremities spontaneously. Musculoskeletal: Normal muscle tone without kyphosis Psych:  Responds to questions appropriately with a normal affect.    Assessment: 75 year old female with a past medical history of HFpEF, hypertension, obesity, hyperlipidemia, type 2 diabetes currently admitted to the hospital with acute congestive heart failure and fluid overload with CXR showing moderate pulmonary edema and small bilateral pleural effusions combined with patient's complaints of 10 pound weight gain and worsening lower extremity edema, BNP of 198.  Patient also with chronic kidney disease with elevated creatinine and a GFR of 31.   Patient normally does not require oxygen at home.  She remains on oxygen today.   Plan: -Continue IV Lasix for fluid overload.  Patient has had good urine output over the last 24 hours.  Continue to monitor intake and output and kidney function. -Continue home medications for management of hypertension including carvedilol, amlodipine.  Continue to hold lisinopril, hydrochlorothiazide for now given patient's kidney function. -No need for further cardiac diagnostic testing at this time with recent echocardiogram performed earlier this year.  Signed, Jettie Booze PA-C [facility] 07/25/2021, 1:46 PM  The patient has been interviewed and examined. I agree with assessment and plan above. Serafina Royals MD [facility]

## 2021-07-25 NOTE — Progress Notes (Addendum)
PROGRESS NOTE   HPI was taken from Dr. Tobie Poet: Julie [name] is a 75 y.o. female with medical history significant for chronic heart failure with preserved ejection fraction, hypertension, obesity, hyperlipidemia, non-insulin-dependent diabetes mellitus, who presents emergency department for chief concerns of shortness of breath.   She states that the shortness of breath gradual over the last month and worsened over the weekend, worse with ambulation and after eating. She reports she has never felt this way before. She reports she has gained about 10-12 pounds, with increase swelling in her legs.    She is not on home O2 [place] at this time. She reports she has been urinating more than usual. She denies dysuria, nausea, vomiting. She reports compliance with her fluid pills. She missed perhaps 1 dose over the last month.  She has had to sleep on recliner for the last month.  She reports worsening shortness of breath with laying flat and with exertion. She endorses dry cough, that has been present on/off for 3 weeks.    She endorses feeling epigastric fullness after eating, even a light meal. She states this has been ongoing for about 1 month. She reports these symptoms were not present prior to NM stress test in June 2022.    She denies changes to her diet.  Below is my review of her liquid intake on a regular basis:   Water: 16 oz per day Tea unsweetened, Splenda, caffeinated: 16 - 32 oz per day. She reports that if she drinks 32-40 oz of her tea that day, she does not drink any water because she counts the ice in the tea as water. Soda: not everyday, 10 oz, 2-3x per week Coffee: 8-10 oz per day Juice: none Milk: none Soups: none Etoh: none    SATOYA FEELEY  YPP:[id number] DOB: 1946/08/21 DOA: 07/24/2021 PCP: Gladstone Lighter, MD    Assessment & Plan:   Principal Problem:   Acute on chronic respiratory failure with hypoxia ([place]) Active Problems:   Essential hypertension   Mixed  hyperlipidemia   Type 2 diabetes mellitus with microalbuminuria, without long-term current use of insulin (HCC)   CKD (chronic kidney disease) stage 2, GFR 60-89 ml/min   Morbid obesity (H[facility])   Benign essential hypertension   Hyperlipidemia associated with type 2 diabetes mellitus (H[facility])   Shortness of breath   AKI (acute kidney injury) ([place])   Acute on chronic hypoxic respiratory failure: possibly secondary acute on chronic diastolic CHF exacerbation. W/ orthopnea. Continue on IV lasix and po coreg. Monitor I/Os. Does not use oxygen at home. Cardio consulted   Likely acute on chronic diastolic CHF: w/ orthopnea. Continue on IV lasix. Monitor I/Os. Cardio consulted  AKI on CKDIIIa: baseline Cr around 1.19-1.21. Cr is trending slightly from day prior  Likely ACD: likely secondary to CKD. No need for a transfusion currently   Morbid obesity: BMI 48.2. Complicates overall care & prognosis  Possible OSA: CPAP qhs   HTN: continue on home dose of coreg, amlodipine. Continue to hold lisinopril, HCTZ   HLD: continue on statin   DM2: likely poorly controlled. Continue on SSI w/ accuchecks   DVT prophylaxis: heparin  Code Status: full  Family Communication:  Disposition Plan: likely d/c back home   Level of care: Med-Surg  Status is: Inpatient  Remains inpatient appropriate because:IV treatments appropriate due to intensity of illness or inability to take PO and Inpatient level of care appropriate due to severity of illness  Dispo: The patient is from:  Home              Anticipated d/c is to: Home              Patient currently is not medically stable to d/c.   Difficult to place patient : unclear    Consultants:  Cardio   Procedures:   Antimicrobials:    Subjective: Pt c/o shortness of breath   Objective: Vitals:   07/24/21 2022 07/25/21 0051 07/25/21 0400 07/25/21 0440  BP: (!) 124/58 (!) 142/57  (!) 142/62  Pulse: 75 81  77  Resp: 17 18    Temp: 97.6 F (36.4  C) 98 F (36.7 C)    TempSrc:      SpO2: 90% (!) 89% 92% 93%  Weight:      Height:       No intake or output data in the 24 hours ending 07/25/21 0734 Filed Weights   07/24/21 1138  Weight: 127.5 kg    Examination:  General exam: Appears calm and comfortable  Respiratory system: diminished breath sounds b/l  Cardiovascular system: S1 & S2+. No rubs, gallops or clicks.  Gastrointestinal system: Abdomen is nondistended, soft and nontender. Normal bowel sounds heard. Central nervous system: Alert and oriented. Moves all extremities  Psychiatry: Judgement and insight appear normal. Mood & affect appropriate.     Data Reviewed: I have personally reviewed following labs and imaging studies  CBC: Recent Labs  Lab 07/24/21 1140 07/25/21 0502  WBC 6.7 7.3  HGB 10.1* 9.7*  HCT 33.4* 31.9*  MCV 86.5 86.4  PLT 162 517   Basic Metabolic Panel: Recent Labs  Lab 07/24/21 1140 07/25/21 0502  NA 140 141  K 3.9 3.9  CL 103 105  CO2 30 27  GLUCOSE 138* 126*  BUN 44* 41*  CREATININE 1.78* 1.74*  CALCIUM 9.0 8.7*   GFR: Estimated Creatinine Clearance: 38.1 mL/min (A) (by C-G formula based on SCr of 1.74 mg/dL (H)). Liver Function Tests: No results for input(s): AST, ALT, ALKPHOS, BILITOT, PROT, ALBUMIN in the last 168 hours. No results for input(s): LIPASE, AMYLASE in the last 168 hours. No results for input(s): AMMONIA in the last 168 hours. Coagulation Profile: No results for input(s): INR, PROTIME in the last 168 hours. Cardiac Enzymes: No results for input(s): CKTOTAL, CKMB, CKMBINDEX, TROPONINI in the last 168 hours. BNP (last 3 results) No results for input(s): PROBNP in the last 8760 hours. HbA1C: Recent Labs    07/24/21 1640  HGBA1C 5.4   CBG: Recent Labs  Lab 07/24/21 1838 07/24/21 2141  GLUCAP 186* 114*   Lipid Profile: No results for input(s): CHOL, HDL, LDLCALC, TRIG, CHOLHDL, LDLDIRECT in the last 72 hours. Thyroid Function Tests: No results for  input(s): TSH, T4TOTAL, FREET4, T3FREE, THYROIDAB in the last 72 hours. Anemia Panel: No results for input(s): VITAMINB12, FOLATE, FERRITIN, TIBC, IRON, RETICCTPCT in the last 72 hours. Sepsis Labs: Recent Labs  Lab 07/24/21 [place] <0.10    Recent Results (from the past 240 hour(s))  Resp Panel by RT-PCR (Flu A&B, Covid) Nasopharyngeal Swab     Status: None   Collection Time: 07/24/21  2:04 PM   Specimen: Nasopharyngeal Swab; Nasopharyngeal(NP) swabs in vial transport medium  Result Value Ref Range Status   SARS Coronavirus 2 by RT PCR NEGATIVE NEGATIVE Final    Comment: (NOTE) SARS-CoV-2 target nucleic acids are NOT DETECTED.  The SARS-CoV-2 RNA is generally detectable in upper respiratory specimens during the acute phase of infection.  The lowest concentration of SARS-CoV-2 viral copies this assay can detect is 138 copies/mL. A negative result does not preclude SARS-Cov-2 infection and should not be used as the sole basis for treatment or other patient management decisions. A negative result may occur with  improper specimen collection/handling, submission of specimen other than nasopharyngeal swab, presence of viral mutation(s) within the areas targeted by this assay, and inadequate number of viral copies(<138 copies/mL). A negative result must be combined with clinical observations, patient history, and epidemiological information. The expected result is Negative.  Fact Sheet for Patients:  EntrepreneurPulse.com.au  Fact Sheet for Healthcare Providers:  IncredibleEmployment.be  This test is no t yet approved or cleared by the [place] FDA and  has been authorized for detection and/or diagnosis of SARS-CoV-2 by FDA under an Emergency Use Authorization (EUA). This EUA will remain  in effect (meaning this test can be used) for the duration of the COVID-19 declaration under Section 564(b)(1) of the Act, 21 U.S.C.section  360bbb-3(b)(1), unless the authorization is terminated  or revoked sooner.       Influenza A by PCR NEGATIVE NEGATIVE Final   Influenza B by PCR NEGATIVE NEGATIVE Final    Comment: (NOTE) The Xpert Xpress SARS-CoV-2/FLU/RSV plus assay is intended as an aid in the diagnosis of influenza from Nasopharyngeal swab specimens and should not be used as a sole basis for treatment. Nasal washings and aspirates are unacceptable for Xpert Xpress SARS-CoV-2/FLU/RSV testing.  Fact Sheet for Patients: EntrepreneurPulse.com.au  Fact Sheet for Healthcare Providers: IncredibleEmployment.be  This test is not yet approved or cleared by the [place] FDA and has been authorized for detection and/or diagnosis of SARS-CoV-2 by FDA under an Emergency Use Authorization (EUA). This EUA will remain in effect (meaning this test can be used) for the duration of the COVID-19 declaration under Section 564(b)(1) of the Act, 21 U.S.C. section 360bbb-3(b)(1), unless the authorization is terminated or revoked.  Performed at [facility], [address], [place], [place] [address]          Radiology Studies: DG Chest 2 View  Result Date: 07/24/2021 CLINICAL DATA:  75 year old female with history of shortness of breath. EXAM: CHEST - 2 VIEW COMPARISON:  Chest x-ray 01/21/2021. FINDINGS: Lung volumes are low. There is cephalization of the pulmonary vasculature, indistinctness of the interstitial markings, and patchy airspace disease throughout the lungs bilaterally suggestive of moderate pulmonary edema. Small bilateral pleural effusions. Heart size is mildly enlarged. Dilatation of the central pulmonary arteries. Upper mediastinal contours are within normal limits. Atherosclerotic calcifications in the thoracic aorta. IMPRESSION: 1. The appearance the chest suggest congestive heart failure, as above. 2. Marked dilatation of the central pulmonary arteries,  concerning for pulmonary arterial hypertension. 3. Aortic atherosclerosis. Electronically Signed   By: Vinnie Langton M.D.   On: 07/24/2021 12:36        Scheduled Meds:  amLODipine  10 mg Oral Daily   atorvastatin  20 mg Oral QHS   carvedilol  12.5 mg Oral BID WC   furosemide  40 mg Intravenous Daily   heparin  5,000 Units Subcutaneous Q8H   insulin aspart  0-15 Units Subcutaneous TID WC   insulin aspart  0-5 Units Subcutaneous QHS   Continuous Infusions:   LOS: 0 days    Time spent: 33 mins     Wyvonnia Dusky, MD [facility] Pager [phone]  If 7PM-7AM, please contact night-coverage 07/25/2021, 7:34 AM

## 2021-07-26 DIAGNOSIS — D649 Anemia, unspecified: Secondary | ICD-10-CM

## 2021-07-26 DIAGNOSIS — N189 Chronic kidney disease, unspecified: Secondary | ICD-10-CM

## 2021-07-26 DIAGNOSIS — J9601 Acute respiratory failure with hypoxia: Secondary | ICD-10-CM

## 2021-07-26 DIAGNOSIS — I10 Essential (primary) hypertension: Secondary | ICD-10-CM

## 2021-07-26 DIAGNOSIS — E785 Hyperlipidemia, unspecified: Secondary | ICD-10-CM

## 2021-07-26 DIAGNOSIS — I5033 Acute on chronic diastolic (congestive) heart failure: Secondary | ICD-10-CM

## 2021-07-26 LAB — CBC
HCT: 30.5 % — ABNORMAL LOW (ref 36.0–46.0)
Hemoglobin: 9.4 g/dL — ABNORMAL LOW (ref 12.0–15.0)
MCH: 26.9 pg (ref 26.0–34.0)
MCHC: 30.8 g/dL (ref 30.0–36.0)
MCV: 87.4 fL (ref 80.0–100.0)
Platelets: 146 10*3/uL — ABNORMAL LOW (ref 150–400)
RBC: 3.49 MIL/uL — ABNORMAL LOW (ref 3.87–5.11)
RDW: 18.4 % — ABNORMAL HIGH (ref 11.5–15.5)
WBC: 5.7 10*3/uL (ref 4.0–10.5)
nRBC: 0 % (ref 0.0–0.2)

## 2021-07-26 LAB — GLUCOSE, CAPILLARY
Glucose-Capillary: 161 mg/dL — ABNORMAL HIGH (ref 70–99)
Glucose-Capillary: 171 mg/dL — ABNORMAL HIGH (ref 70–99)
Glucose-Capillary: 109 mg/dL — ABNORMAL HIGH (ref 70–99)
Glucose-Capillary: 162 mg/dL — ABNORMAL HIGH (ref 70–99)

## 2021-07-26 LAB — BASIC METABOLIC PANEL
Anion gap: 8 (ref 5–15)
BUN: 38 mg/dL — ABNORMAL HIGH (ref 8–23)
CO2: 30 mmol/L (ref 22–32)
Calcium: 8.9 mg/dL (ref 8.9–10.3)
Chloride: 102 mmol/L (ref 98–111)
Creatinine, Ser: 1.5 mg/dL — ABNORMAL HIGH (ref 0.44–1.00)
GFR, Estimated: 37 mL/min — ABNORMAL LOW (ref >60)
Glucose, Bld: 150 mg/dL — ABNORMAL HIGH (ref 70–99)
Potassium: 3.8 mmol/L (ref 3.5–5.1)
Sodium: 140 mmol/L (ref 135–145)

## 2021-07-26 MED ORDER — ENOXAPARIN SODIUM 80 MG/0.8ML IJ SOSY
0.5000 mg/kg | PREFILLED_SYRINGE | INTRAMUSCULAR | Status: DC
  Administered 2021-07-26: 65 mg via SUBCUTANEOUS
  Filled 2021-07-26 (×2): qty 0.65

## 2021-07-26 MED ORDER — INFLUENZA VAC A&B SA ADJ QUAD 0.5 ML IM PRSY
0.5000 mL | PREFILLED_SYRINGE | INTRAMUSCULAR | Status: AC
  Administered 2021-07-27: 0.5 mL via INTRAMUSCULAR
  Filled 2021-07-26: qty 0.5

## 2021-07-26 MED ORDER — CARISOPRODOL 350 MG PO TABS
350.0000 mg | ORAL_TABLET | Freq: Three times a day (TID) | ORAL | Status: DC | PRN
  Administered 2021-07-26: 350 mg via ORAL
  Filled 2021-07-26: qty 1

## 2021-07-26 MED ORDER — FUROSEMIDE 40 MG PO TABS
40.0000 mg | ORAL_TABLET | Freq: Two times a day (BID) | ORAL | Status: DC
  Administered 2021-07-26 – 2021-07-27 (×2): 40 mg via ORAL
  Filled 2021-07-26 (×2): qty 1

## 2021-07-26 NOTE — Progress Notes (Addendum)
SATURATION QUALIFICATIONS: (This note is used to comply with regulatory documentation for home oxygen)  Patient Saturations on Room Air at Rest = 91%  Patient Saturations on Room Air while Ambulating = 70%  Patient Saturations on 2   Liters of oxygen while Ambulating = 88%  Please briefly explain why patient needs home oxygen: destats with exertion. Used ear lobe for oxygen stats due to poor perfusion in fingers

## 2021-07-26 NOTE — Progress Notes (Signed)
Patient ID: Julie [name], female   DOB: 1945/11/02, 75 y.o.   MRN: [id number] [facility] PROGRESS NOTE  ELLANOR FEUERSTEIN NTZ:[id number] DOB: 02-04-1946 DOA: 07/24/2021 PCP: Gladstone Lighter, MD  HPI/Subjective: Patient feeling better than when she came in.  Feels a little less short of breath.  Her back does hurt from being in the bed.  Her legs are little less swollen.  Her abdomen is the same size.  Admitted with CHF exacerbation.  Patient unable to come off the oxygen today with pulse ox dropping down into the 70s with ambulation.  Objective: Vitals:   07/26/21 1147 07/26/21 1226  BP:  135/60  Pulse:  81  Resp:  19  Temp:  98 F (36.7 C)  SpO2: 93% 92%    Intake/Output Summary (Last 24 hours) at 07/26/2021 1533 Last data filed at 07/26/2021 1413 Gross per 24 hour  Intake 480 ml  Output 1650 ml  Net -1170 ml   Filed Weights   07/24/21 1138  Weight: 127.5 kg    ROS: Review of Systems  Respiratory:  Positive for shortness of breath.   Cardiovascular:  Negative for chest pain.  Gastrointestinal:  Negative for abdominal pain, nausea and vomiting.  Exam: Physical Exam HENT:     Head: Normocephalic.     Mouth/Throat:     Pharynx: No oropharyngeal exudate.  Eyes:     General: Lids are normal.     Conjunctiva/sclera: Conjunctivae normal.  Cardiovascular:     Rate and Rhythm: Normal rate and regular rhythm.     Heart sounds: Normal heart sounds, S1 normal and S2 normal.  Pulmonary:     Breath sounds: Examination of the right-lower field reveals decreased breath sounds and rales. Examination of the left-lower field reveals decreased breath sounds and rales. Decreased breath sounds and rales present. No wheezing or rhonchi.  Abdominal:     Palpations: Abdomen is soft.     Tenderness: There is no abdominal tenderness.  Musculoskeletal:     Right lower leg: Swelling present.     Left lower leg: Swelling present.  Skin:    General: Skin is warm.      Comments: Chronic lower extremity skin discoloration  Neurological:     Mental Status: She is alert and oriented to person, place, and time.      Scheduled Meds:  amLODipine  10 mg Oral Daily   atorvastatin  20 mg Oral QHS   carvedilol  12.5 mg Oral BID WC   enoxaparin (LOVENOX) injection  0.5 mg/kg Subcutaneous Q24H   furosemide  40 mg Oral BID   insulin aspart  0-15 Units Subcutaneous TID WC   insulin aspart  0-5 Units Subcutaneous QHS   Assessment/Plan:  Acute hypoxic respiratory failure.  Pulse ox today with ambulation dropped down into the 70's while on room air.  Likely will end up needing chronic oxygen at home. Acute on chronic diastolic congestive heart failure.  Change Lasix to 40 mg orally twice daily. Acute kidney injury on chronic kidney disease stage IIIa.  Creatinine 1.78 on presentation and down to 1.50 today.  Baseline creatinine around 1.21. Anemia unspecified.  Send off iron studies for tomorrow morning Morbid obesity with a BMI of 48.23 Essential hypertension on Coreg and amlodipine.  Holding lisinopril and hydrochlorothiazide at this time with acute kidney injury. Hyperlipidemia unspecified on atorvastatin Impaired fasting glucose.  Hemoglobin A1c 5.4        Code Status:     Code Status Orders  (  From admission, onward)           Start     Ordered   07/24/21 1410  Full code  Continuous        07/24/21 1411           Code Status History     This patient has a current code status but no historical code status.      Advance Directive Documentation    Flowsheet Row Most Recent Value  Type of Advance Directive Healthcare Power of Attorney  Pre-existing out of facility DNR order (yellow form or pink MOST form) --  "MOST" Form in Place? --       Disposition Plan: Status is: Inpatient  Dispo: The patient is from: Home              Anticipated d/c is to: Home              Patient currently still trying to get the patient off oxygen  prior to disposition   Difficult to place patient.  No.  Time spent: 28 minutes  [place]

## 2021-07-26 NOTE — Progress Notes (Signed)
[facility] Encounter Note  Patient: Julie [name] / Admit Date: 07/24/2021 / Date of Encounter: 07/26/2021, 9:18 AM   Subjective: 75 y.o. female with a past medical history of HFpEF, hypertension, obesity, hyperlipidemia, type 2 diabetes.  Patient had presented to the ED with complaints of shortness of breath worsening over the last month and worse with ambulation and endorses orthopnea over the last week requiring her to use a recliner to sleep in.  Patient does report a recent 10 pound weight gain with worsening lower extremity edema.  She reports compliance with her home medications including her diuretics.  Patient also has a history of chronic kidney disease with current creatinine of 1.74 and GFR of 31, also with anemia and hemoglobin of 9.7.  CXR with moderate pulmonary edema and small bilateral pleural effusions consistent with congestive heart failure.  Today patient states that she is doing well.  She no longer complains of any shortness of breath and reports improved edema in her lower legs.  Patient states that she feels much improved from her time of admission.  Review of Systems: Positive for: None Negative for: Vision change, hearing change, syncope, dizziness, nausea, vomiting,diarrhea, bloody stool, stomach pain, cough, congestion, diaphoresis, urinary frequency, urinary pain,skin lesions, skin rashes Others previously listed  Objective: Telemetry: Normal sinus rhythm Physical Exam: Blood pressure 137/60, pulse 77, temperature 97.8 F (36.6 C), resp. rate 18, height 5\' 4"  (1.626 m), weight 127.5 kg, SpO2 95 %. Body mass index is 48.23 kg/m. General: Well developed, well nourished, in no acute distress. Head: Normocephalic, atraumatic, sclera non-icteric, no xanthomas, nares are without discharge. Neck: No apparent masses Lungs: Normal respirations with no wheezes, no rhonchi, no rales , no crackles   Heart: Regular rate and rhythm, normal S1 S2,  no murmur, no rub, no gallop, PMI is normal size and placement, carotid upstroke normal without bruit, jugular venous pressure normal Abdomen: Soft, non-tender, non-distended with normoactive bowel sounds. No hepatosplenomegaly. Abdominal aorta is normal size without bruit Extremities: Trace edema, no clubbing, no cyanosis, no ulcers,  Peripheral: 2+ radial, 2+ femoral, 2+ dorsal pedal pulses Neuro: Alert and oriented. Moves all extremities spontaneously. Psych:  Responds to questions appropriately with a normal affect.   Intake/Output Summary (Last 24 hours) at 07/26/2021 0918 Last data filed at 07/26/2021 0025 Gross per 24 hour  Intake 360 ml  Output 1150 ml  Net -790 ml    Inpatient Medications:  . amLODipine  10 mg Oral Daily  . atorvastatin  20 mg Oral QHS  . carvedilol  12.5 mg Oral BID WC  . furosemide  40 mg Intravenous Daily  . heparin  5,000 Units Subcutaneous Q8H  . insulin aspart  0-15 Units Subcutaneous TID WC  . insulin aspart  0-5 Units Subcutaneous QHS   Infusions:   Labs: Recent Labs    07/25/21 0502 07/26/21 0609  NA 141 140  K 3.9 3.8  CL 105 102  CO2 27 30  GLUCOSE 126* 150*  BUN 41* 38*  CREATININE 1.74* 1.50*  CALCIUM 8.7* 8.9   No results for input(s): AST, ALT, ALKPHOS, BILITOT, PROT, ALBUMIN in the last 72 hours. Recent Labs    07/25/21 0502 07/26/21 0609  WBC 7.3 5.7  HGB 9.7* 9.4*  HCT 31.9* 30.5*  MCV 86.4 87.4  PLT 155 146*   No results for input(s): CKTOTAL, CKMB, TROPONINI in the last 72 hours. Invalid input(s): POCBNP Recent Labs    07/24/21 1640  HGBA1C 5.4  Weights: Filed Weights   07/24/21 1138  Weight: 127.5 kg     Radiology/Studies:  DG Chest 2 View  Result Date: 07/24/2021 CLINICAL DATA:  75 year old female with history of shortness of breath. EXAM: CHEST - 2 VIEW COMPARISON:  Chest x-ray 01/21/2021. FINDINGS: Lung volumes are low. There is cephalization of the pulmonary vasculature, indistinctness of the  interstitial markings, and patchy airspace disease throughout the lungs bilaterally suggestive of moderate pulmonary edema. Small bilateral pleural effusions. Heart size is mildly enlarged. Dilatation of the central pulmonary arteries. Upper mediastinal contours are within normal limits. Atherosclerotic calcifications in the thoracic aorta. IMPRESSION: 1. The appearance the chest suggest congestive heart failure, as above. 2. Marked dilatation of the central pulmonary arteries, concerning for pulmonary arterial hypertension. 3. Aortic atherosclerosis. Electronically Signed   By: Vinnie Langton M.D.   On: 07/24/2021 12:36   CT Angio Chest Pulmonary Embolism (PE) W or WO Contrast  Result Date: 07/07/2021 CLINICAL DATA:  Shortness of breath EXAM: CT ANGIOGRAPHY CHEST WITH CONTRAST TECHNIQUE: Multidetector CT imaging of the chest was performed using the standard protocol during bolus administration of intravenous contrast. Multiplanar CT image reconstructions and MIPs were obtained to evaluate the vascular anatomy. CONTRAST:  58mL OMNIPAQUE IOHEXOL 350 MG/ML SOLN COMPARISON:  None. FINDINGS: Cardiovascular: Examination is somewhat limited by motion artifact throughout. Within this limitation, no evidence of pulmonary embolism through the proximal segmental pulmonary arterial level. Normal heart size. Extensive 3 vessel coronary artery calcifications. No pericardial effusion. Aortic atherosclerosis. Mediastinum/Nodes: No enlarged mediastinal, hilar, or axillary lymph nodes. Thyroid gland, trachea, and esophagus demonstrate no significant findings. Lungs/Pleura: Small right pleural effusion associated atelectasis or consolidation. Interlobular septal thickening and mosaic attenuation throughout the airspaces. Scarring and volume loss of the left lung base. Upper Abdomen: No acute abnormality. Musculoskeletal: No chest wall abnormality. No acute or significant osseous findings. Review of the MIP images confirms the  above findings. IMPRESSION: 1. Examination is somewhat limited by motion artifact throughout. Within this limitation, no evidence of pulmonary embolism through the proximal segmental pulmonary arterial level. 2. Small right pleural effusion and associated atelectasis or consolidation. 3. Interlobular septal thickening and mosaic attenuation throughout the airspaces, this appearance suggesting a combination of pulmonary edema and small airways disease. 4. Coronary artery disease. Aortic Atherosclerosis (ICD10-I70.0). Electronically Signed   By: Eddie Candle M.D.   On: 07/07/2021 16:20     Assessment and Recommendation  75 y.o. female with a past medical history of HFpEF, hypertension, obesity, hyperlipidemia, type 2 diabetes currently admitted to the hospital with acute congestive heart failure and fluid overload with CXR showing moderate pulmonary edema and small bilateral pleural effusions combined with patient's complaints of 10 pound weight gain and worsening lower extremity edema, BNP of 198.  Patient also with chronic kidney disease with elevated creatinine and a GFR of 31.  Patient has had a net I/O of -1.2 L since admission.  Plan: -Continue home medications for management of hypertension including carvedilol, amlodipine. -No further cardiac diagnostic testing necessary at this time with recent echocardiogram performed earlier this year. -Patient is currently stable from a cardiovascular standpoint and ready for discharge.  Recommend patient is transition to oral Lasix 80 mg daily on discharge. -Follow-up as an outpatient within 1 to 2 weeks.  Signed, Jettie Booze, PA-C

## 2021-07-27 DIAGNOSIS — E1169 Type 2 diabetes mellitus with other specified complication: Secondary | ICD-10-CM

## 2021-07-27 DIAGNOSIS — D509 Iron deficiency anemia, unspecified: Secondary | ICD-10-CM

## 2021-07-27 LAB — HEMOGLOBIN: Hemoglobin: 9 g/dL — ABNORMAL LOW (ref 12.0–15.0)

## 2021-07-27 LAB — IRON AND TIBC
Iron: 29 ug/dL (ref 28–170)
Saturation Ratios: 8 % — ABNORMAL LOW (ref 10.4–31.8)
TIBC: 363 ug/dL (ref 250–450)
UIBC: 334 ug/dL

## 2021-07-27 LAB — BASIC METABOLIC PANEL
Anion gap: 4 — ABNORMAL LOW (ref 5–15)
BUN: 39 mg/dL — ABNORMAL HIGH (ref 8–23)
CO2: 32 mmol/L (ref 22–32)
Calcium: 8.6 mg/dL — ABNORMAL LOW (ref 8.9–10.3)
Chloride: 101 mmol/L (ref 98–111)
Creatinine, Ser: 1.51 mg/dL — ABNORMAL HIGH (ref 0.44–1.00)
GFR, Estimated: 36 mL/min — ABNORMAL LOW (ref >60)
Glucose, Bld: 151 mg/dL — ABNORMAL HIGH (ref 70–99)
Potassium: 3.8 mmol/L (ref 3.5–5.1)
Sodium: 137 mmol/L (ref 135–145)

## 2021-07-27 LAB — FERRITIN: Ferritin: 21 ng/mL (ref 11–307)

## 2021-07-27 LAB — GLUCOSE, CAPILLARY
Glucose-Capillary: 182 mg/dL — ABNORMAL HIGH (ref 70–99)
Glucose-Capillary: 174 mg/dL — ABNORMAL HIGH (ref 70–99)

## 2021-07-27 MED ORDER — FUROSEMIDE 40 MG PO TABS: 40.0000 mg | ORAL_TABLET | Freq: Two times a day (BID) | ORAL | 0 refills | Status: AC

## 2021-07-27 MED ORDER — FERROUS SULFATE 325 (65 FE) MG PO TABS: 325.0000 mg | ORAL_TABLET | Freq: Every day | ORAL | 0 refills | Status: AC

## 2021-07-27 MED ORDER — GLIPIZIDE ER 5 MG PO TB24
5.0000 mg | ORAL_TABLET | Freq: Every day | ORAL | 0 refills | Status: AC
Start: 2021-07-27 — End: ?

## 2021-07-27 NOTE — Progress Notes (Signed)
SATURATION QUALIFICATIONS: (This note is used to comply with regulatory documentation for home oxygen)  Patient Saturations on Room Air at Rest = 89%  Patient Saturations on Room Air while Ambulating = 79%  Patient Saturations on 2 Liters of oxygen while Ambulating = 95%  Please briefly explain why patient needs home oxygen: [place] with ambulating

## 2021-07-27 NOTE — Progress Notes (Signed)
Patient provided discharge instructions. Patient verbalized an understanding of instructions including medications. Patient home oxygen was delivered. Patient's pastor is her ride.

## 2021-07-27 NOTE — Discharge Summary (Signed)
[facility] NAME: Julie [name]    MR#:  [id number]  DATE OF BIRTH:  1946-05-19  DATE OF ADMISSION:  07/24/2021 ADMITTING PHYSICIAN: Wyvonnia Dusky, MD  DATE OF DISCHARGE: 07/27/2021  2:59 PM  PRIMARY CARE PHYSICIAN: Gladstone Lighter, MD    ADMISSION DIAGNOSIS:  Shortness of breath [R06.02] SOB (shortness of breath) [R06.02] Acute respiratory failure with hypoxia (HCC) [J96.01] Acute on chronic congestive heart failure, unspecified heart failure type ([place]) [I50.9] Acute exacerbation of CHF (congestive heart failure) ([place]) [I50.9]  DISCHARGE DIAGNOSIS:  Principal Problem:   Acute respiratory failure with hypoxia (HCC) Active Problems:   Essential hypertension   Mixed hyperlipidemia   Type 2 diabetes mellitus with microalbuminuria, without long-term current use of insulin (HCC)   CKD (chronic kidney disease) stage 2, GFR 60-89 ml/min   Morbid obesity (HCC)   Benign essential hypertension   Hyperlipidemia   Shortness of breath   Acute kidney injury superimposed on CKD (HCC)   Acute exacerbation of CHF (congestive heart failure) (HCC)   Acute on chronic diastolic CHF (congestive heart failure) ([place])   Anemia   SECONDARY DIAGNOSIS:   Past Medical History:  Diagnosis Date  . Arthritis   . Cataract   . CKD (chronic kidney disease) stage 2, GFR 60-89 ml/min   . Diabetes mellitus without complication ([place])   . Hyperlipidemia   . Hypertension   . Morbid obesity ([place])   . Osteoporosis March 2015  . Proteinuria Jan 2015   referred to Nephrology    HOSPITAL COURSE:   Acute on chronic hypoxic respiratory failure.  Pulse ox with ambulation did drop down into the 70s.  Patient will be set up with 2 L of oxygen chronically.  Recommend outpatient sleep study and potentially PFTs.  CT scan of the chest did not show any evidence of pulmonary embolism through the proximal segment pulmonary arterial level.  Small right  pleural effusion and associated atelectasis.  There also several mosaic pattern through the airspaces suggesting combination of pulmonary edema and small airway disease. Acute on chronic diastolic congestive heart failure.  The patient was diuresed with Lasix 40 mg IV daily during the hospital course.  Patient feeling better with regards to her breathing and wanted to go home.  I did change Lasix to 40 mg twice a day.  Patient also on Coreg and can go back on lisinopril. Acute kidney injury on chronic kidney disease stage IIIa.  Creatinine 1.78 on presentation and down to 1.51 today.  Baseline creatinine around 1.21.  Patient follows up with nephrology. Iron deficiency anemia.  Start ferrous sulfate.  Consider GI work-up as outpatient once stable. Morbid obesity with a BMI of 48.23 Essential hypertension on Coreg amlodipine can go back on lisinopril as outpatient.  Discontinue hydrochlorothiazide. Type 2 diabetes mellitus with hyperlipidemia unspecified on atorvastatin.  Since hemoglobin A1c low at 5.4 can get rid of metformin with chronic kidney disease.  Can cut back on Glucotrol XL 5 mg daily instead of 10 mg twice a day.   DISCHARGE CONDITIONS:   Satisfactory  CONSULTS OBTAINED:   Cardiology DRUG ALLERGIES:   Allergies  Allergen Reactions  . Nsaids Other (See Comments)    CKD  . Penicillins Other (See Comments)    Not sure of reaction     DISCHARGE MEDICATIONS:   Allergies as of 07/27/2021       Reactions   Nsaids Other (See Comments)   CKD   Penicillins  Other (See Comments)   Not sure of reaction         Medication List     STOP taking these medications    aspirin 81 MG tablet   bevacizumab 1.25 mg/0.1 mL Soln Commonly known as: AVASTIN   hydrochlorothiazide 25 MG tablet Commonly known as: HYDRODIURIL   metFORMIN 500 MG tablet Commonly known as: GLUCOPHAGE       TAKE these medications    alendronate 70 MG tablet Commonly known as: FOSAMAX Take 1  tablet (70 mg total) by mouth once a week.   amLODipine 10 MG tablet Commonly known as: NORVASC TAKE 1 TABLET BY MOUTH  DAILY   atorvastatin 40 MG tablet Commonly known as: LIPITOR Take 0.5 tablets (20 mg total) by mouth at bedtime.   carvedilol 12.5 MG tablet Commonly known as: COREG Take 12.5 mg by mouth 2 (two) times daily with a meal. What changed: Another medication with the same name was removed. Continue taking this medication, and follow the directions you see here.   Cholecalciferol 25 MCG (1000 UT) tablet Take 1,000 Units by mouth daily.   ferrous sulfate 325 (65 FE) MG tablet Take 1 tablet (325 mg total) by mouth daily.   Fish Oil 1000 MG Caps Take 1,000 mg by mouth 2 (two) times daily.   furosemide 40 MG tablet Commonly known as: LASIX Take 1 tablet (40 mg total) by mouth 2 (two) times daily. What changed:  medication strength how much to take when to take this Another medication with the same name was removed. Continue taking this medication, and follow the directions you see here.   glipiZIDE 5 MG 24 hr tablet Commonly known as: GLUCOTROL XL Take 1 tablet (5 mg total) by mouth daily with breakfast. What changed:  medication strength how much to take when to take this   lisinopril 40 MG tablet Commonly known as: ZESTRIL Take 1 tablet (40 mg total) by mouth daily.   Multi-Vitamins Tabs Take by mouth daily.   OneTouch Verio test strip Generic drug: glucose blood 1 each daily.               Durable Medical Equipment  (From admission, onward)           Start     Ordered   07/27/21 1028  For home use only DME oxygen  Once       Question Answer Comment  Length of Need Lifetime   Mode or (Route) Nasal cannula   Liters per Minute 2   Frequency Continuous (stationary and portable oxygen unit needed)   Oxygen conserving device Yes   Oxygen delivery system Gas      07/27/21 1027             DISCHARGE INSTRUCTIONS:   Follow-up  PMD 5 days Follow-up cardiology 1 to 2 weeks Follow-up CHF clinic  If you experience worsening of your admission symptoms, develop shortness of breath, life threatening emergency, suicidal or homicidal thoughts you must seek medical attention immediately by calling 911 or calling your MD immediately  if symptoms less severe.  You Must read complete instructions/literature along with all the possible adverse reactions/side effects for all the Medicines you take and that have been prescribed to you. Take any new Medicines after you have completely understood and accept all the possible adverse reactions/side effects.   Please note  You were cared for by a hospitalist during your hospital stay. If you have any questions about your discharge medications  or the care you received while you were in the hospital after you are discharged, you can call the unit and asked to speak with the hospitalist on call if the hospitalist that took care of you is not available. Once you are discharged, your primary care physician will handle any further medical issues. Please note that NO REFILLS for any discharge medications will be authorized once you are discharged, as it is imperative that you return to your primary care physician (or establish a relationship with a primary care physician if you do not have one) for your aftercare needs so that they can reassess your need for medications and monitor your lab values.    Today   CHIEF COMPLAINT:   Chief Complaint  Patient presents with  . Shortness of Breath    HISTORY OF PRESENT ILLNESS:  Julie [name]  is a 75 y.o. female came in with shortness of breath   VITAL SIGNS:  Blood pressure 135/67, pulse 67, temperature 98.1 F (36.7 C), resp. rate 17, height 5\' 4"  (1.626 m), weight 127.5 kg, SpO2 (!) 85 %.  Pulse ox room air at rest 89%. Pulse ox room air with ambulating 79% Pulse ox on 2 L while ambulating 95%  I/O:   Intake/Output Summary (Last 24  hours) at 07/27/2021 1633 Last data filed at 07/27/2021 1357 Gross per 24 hour  Intake 720 ml  Output 650 ml  Net 70 ml    PHYSICAL EXAMINATION:  GENERAL:  75 y.o.-year-old patient lying in the bed with no acute distress.  EYES: Pupils equal, round, reactive to light and accommodation. No scleral icterus.  HEENT: Head atraumatic, normocephalic. Oropharynx and nasopharynx clear.  LUNGS: Decreased breath sounds bilateral bases, no wheezing, rales,rhonchi or crepitation. No use of accessory muscles of respiration.  CARDIOVASCULAR: S1, S2 normal. No murmurs, rubs, or gallops.  ABDOMEN: Soft, non-tender, non-distended.  EXTREMITIES: No pedal edema.  NEUROLOGIC: Cranial nerves II through XII are intact. Muscle strength 5/5 in all extremities. Sensation intact. Gait not checked.  PSYCHIATRIC: The patient is alert and oriented x 3.  SKIN: No obvious rash, lesion, or ulcer.   DATA REVIEW:   CBC Recent Labs  Lab 07/26/21 0609 07/27/21 0503  WBC 5.7  --   HGB 9.4* 9.0*  HCT 30.5*  --   PLT 146*  --     Chemistries  Recent Labs  Lab 07/27/21 0503  NA 137  K 3.8  CL 101  CO2 32  GLUCOSE 151*  BUN 39*  CREATININE 1.51*  CALCIUM 8.6*     Microbiology Results  Results for orders placed or performed during the hospital encounter of 07/24/21  Resp Panel by RT-PCR (Flu A&B, Covid) Nasopharyngeal Swab     Status: None   Collection Time: 07/24/21  2:04 PM   Specimen: Nasopharyngeal Swab; Nasopharyngeal(NP) swabs in vial transport medium  Result Value Ref Range Status   SARS Coronavirus 2 by RT PCR NEGATIVE NEGATIVE Final    Comment: (NOTE) SARS-CoV-2 target nucleic acids are NOT DETECTED.  The SARS-CoV-2 RNA is generally detectable in upper respiratory specimens during the acute phase of infection. The lowest concentration of SARS-CoV-2 viral copies this assay can detect is 138 copies/mL. A negative result does not preclude SARS-Cov-2 infection and should not be used as the  sole basis for treatment or other patient management decisions. A negative result may occur with  improper specimen collection/handling, submission of specimen other than nasopharyngeal swab, presence of viral mutation(s) within the areas targeted  by this assay, and inadequate number of viral copies(<138 copies/mL). A negative result must be combined with clinical observations, patient history, and epidemiological information. The expected result is Negative.  Fact Sheet for Patients:  EntrepreneurPulse.com.au  Fact Sheet for Healthcare Providers:  IncredibleEmployment.be  This test is no t yet approved or cleared by the [place] FDA and  has been authorized for detection and/or diagnosis of SARS-CoV-2 by FDA under an Emergency Use Authorization (EUA). This EUA will remain  in effect (meaning this test can be used) for the duration of the COVID-19 declaration under Section 564(b)(1) of the Act, 21 U.S.C.section 360bbb-3(b)(1), unless the authorization is terminated  or revoked sooner.       Influenza A by PCR NEGATIVE NEGATIVE Final   Influenza B by PCR NEGATIVE NEGATIVE Final    Comment: (NOTE) The Xpert Xpress SARS-CoV-2/FLU/RSV plus assay is intended as an aid in the diagnosis of influenza from Nasopharyngeal swab specimens and should not be used as a sole basis for treatment. Nasal washings and aspirates are unacceptable for Xpert Xpress SARS-CoV-2/FLU/RSV testing.  Fact Sheet for Patients: EntrepreneurPulse.com.au  Fact Sheet for Healthcare Providers: IncredibleEmployment.be  This test is not yet approved or cleared by the [place] FDA and has been authorized for detection and/or diagnosis of SARS-CoV-2 by FDA under an Emergency Use Authorization (EUA). This EUA will remain in effect (meaning this test can be used) for the duration of the COVID-19 declaration under Section 564(b)(1) of the  Act, 21 U.S.C. section 360bbb-3(b)(1), unless the authorization is terminated or revoked.  Performed at [facility], [address], [place], [place] [address]        Management plans discussed with the patient, and she is in agreement.  CODE STATUS:     Code Status Orders  (From admission, onward)           Start     Ordered   07/24/21 1410  Full code  Continuous        07/24/21 1411           Code Status History     This patient has a current code status but no historical code status.      Advance Directive Documentation    Flowsheet Row Most Recent Value  Type of Advance Directive Healthcare Power of Attorney  Pre-existing out of facility DNR order (yellow form or pink MOST form) --  "MOST" Form in Place? --       TOTAL TIME TAKING CARE OF THIS PATIENT: 35 minutes.    Loletha Grayer M.D on 07/27/2021 at 4:33 PM   [facility]  CC: Primary care physician; Gladstone Lighter, MD

## 2021-07-27 NOTE — TOC Transition Note (Signed)
Transition of Care [facility]) - CM/SW Discharge Note   Patient Details  Name: Julie [name] MRN: [id number] Date of Birth: February 04, 1946  [facility] CM/SW Contact:  Shelbie Hutching, RN Phone Number: 07/27/2021, 1:52 PM   Clinical Narrative:    Patient admitted to the hospital with acute on chronic CHF.  Patient is medically cleared for discharge home today but she does need home oxygen.  RNCM met with patient at the bedside.  Oxygen has been ordered from [employer] and delivered to the bedside by [employer].   Patient is from home with her husband, who has dementia.  Patient walks with a cane and drives.d  Patient is current with her PCP and cardiologist.  Patient's car is here at the  hospital and the MD has said it would be okay for her to drive herself home.  Patient also feels well enough to drive home.     Final next level of care: Home/Self Care Barriers to Discharge: Barriers Resolved   Patient Goals and CMS Choice Patient states their goals for this hospitalization and ongoing recovery are:: Patient is glad to be going home today CMS Medicare.gov Compare Post Acute Care list provided to:: Patient Choice offered to / list presented to : Patient  Discharge Placement                       Discharge Plan and Services   Discharge Planning Services: CM Consult Post Acute Care Choice: Durable Medical Equipment          DME Arranged: Oxygen DME Agency: [facility] Date DME Agency Contacted: 07/27/21 Time DME Agency Contacted: 1100 Representative spoke with at DME Agency: [place]: NA [place] Agency: NA        Social Determinants of Health ([place]) Interventions     Readmission Risk Interventions No flowsheet data found.

## 2021-07-29 ENCOUNTER — Other Ambulatory Visit: Payer: Self-pay | Admitting: Family Medicine

## 2021-07-29 DIAGNOSIS — E782 Mixed hyperlipidemia: Secondary | ICD-10-CM

## 2021-07-30 NOTE — Telephone Encounter (Signed)
pt changed PCP's 02/10/21

## 2021-08-04 ENCOUNTER — Other Ambulatory Visit: Payer: Self-pay | Admitting: Internal Medicine

## 2021-08-04 DIAGNOSIS — Z1231 Encounter for screening mammogram for malignant neoplasm of breast: Secondary | ICD-10-CM

## 2021-08-16 ENCOUNTER — Ambulatory Visit: Payer: Medicare Other | Admitting: Family

## 2021-08-28 NOTE — Progress Notes (Signed)
Patient ID: Julie [name], female    DOB: 21-Mar-1946, 75 y.o.   MRN: [id number]  HPI  Julie [name] is a 75 y/o female with a history of DM, hyperlipidemia, HTN, CKD, osteoporosis and chronic heart failure.   Echo report from 03/28/21 reviewed and showed an EF of >55% without LVH/LAE.   Admitted 07/24/21 due to acute on chronic hypoxic respiratory failure. Chest CT negative for PE. Given IV lasix with transition to oral diuretics. Cardiology consult obtained. Ferrous sulfate started due to anemia. Discharged after 3 days.   Julie [name] presents today for her initial visit with a chief complaint of minimal shortness of breath upon moderate exertion. Julie [name] describes this as chronic in nature having been present for several months. Julie [name] has associated fatigue, intermittent chest pain, easy bruising, pedal edema and difficulty sleeping along with this. Julie [name] denies any dizziness, abdominal distention, palpitations, cough or weight gain.   Julie [name] says that Julie [name] has upcoming echo scheduled with cardiology office. Trying to keep daily fluid intake to ~ 40 ounces.   Past Medical History:  Diagnosis Date   Arthritis    Cataract    CHF (congestive heart failure) (HCC)    CKD (chronic kidney disease) stage 2, GFR 60-89 ml/min    Diabetes mellitus without complication ([place])    Hyperlipidemia    Hypertension    Morbid obesity ([place])    Osteoporosis 12/13/2013   Proteinuria 10/15/2013   referred to Nephrology   Past Surgical History:  Procedure Laterality Date   COLONOSCOPY WITH PROPOFOL N/A 07/16/2017   Procedure: COLONOSCOPY WITH PROPOFOL;  Surgeon: Robert Bellow, MD;  Location: [facility] ENDOSCOPY;  Service: Endoscopy;  Laterality: N/A;   [place] OF UTERUS  2007   ESOPHAGOGASTRODUODENOSCOPY (EGD) WITH PROPOFOL N/A 07/16/2017   Procedure: ESOPHAGOGASTRODUODENOSCOPY (EGD) WITH PROPOFOL;  Surgeon: Robert Bellow, MD;  Location: [facility] ENDOSCOPY;  Service: Endoscopy;  Laterality: N/A;   MOUTH  SURGERY  2007   cyst   TONSILLECTOMY     Family History  Problem Relation Age of Onset   Glaucoma Mother    Cancer Father        liver   Cancer Sister        lung, bone   COPD Sister    Heart disease Maternal Grandmother        chf   Hypertension Brother    Hypertension Maternal Uncle    Ovarian cancer Paternal Grandmother    Cancer Paternal Grandmother        ovarian cancer   Breast cancer Neg Hx    Social History   Tobacco Use   Smoking status: Never   Smokeless tobacco: Never   Tobacco comments:    smoking cessation materials not required  Substance Use Topics   Alcohol use: No   Allergies  Allergen Reactions   Nsaids Other (See Comments)    CKD   Penicillins Other (See Comments)    Not sure of reaction    Prior to Admission medications   Medication Sig Start Date End Date Taking? Authorizing Provider  alendronate (FOSAMAX) 70 MG tablet Take 1 tablet (70 mg total) by mouth once a week. 05/13/20  Yes Delsa Grana, PA-C  amLODipine (NORVASC) 10 MG tablet TAKE 1 TABLET BY MOUTH  DAILY 09/12/20  Yes Delsa Grana, PA-C  atorvastatin (LIPITOR) 40 MG tablet Take 0.5 tablets (20 mg total) by mouth at bedtime. 06/20/20  Yes Delsa Grana, PA-C  carvedilol (COREG) 12.5 MG tablet Take 12.5 mg  by mouth 2 (two) times daily with a meal. 04/13/21 04/13/22 Yes [provider]  Cholecalciferol 25 MCG (1000 UT) tablet Take 1,000 Units by mouth daily.   Yes [provider]  furosemide (LASIX) 40 MG tablet Take 1 tablet (40 mg total) by mouth 2 (two) times daily. 07/27/21  Yes Wieting, Richard, MD  glipiZIDE (GLUCOTROL XL) 5 MG 24 hr tablet Take 1 tablet (5 mg total) by mouth daily with breakfast. 07/27/21  Yes Wieting, Richard, MD  lisinopril (ZESTRIL) 40 MG tablet Take 1 tablet (40 mg total) by mouth daily. 05/17/20  Yes Delsa Grana, PA-C  Multiple Vitamin (MULTI-VITAMINS) TABS Take by mouth daily.    Yes [provider]  Omega-3 Fatty Acids (FISH OIL) 1000 MG  CAPS Take 1,000 mg by mouth 2 (two) times daily.   Yes [provider]  [facility] VERIO test strip 1 each daily. 03/26/20  Yes [provider]  ferrous sulfate 325 (65 FE) MG tablet Take 1 tablet (325 mg total) by mouth daily. 07/27/21 08/26/21  Loletha Grayer, MD    Review of Systems  Constitutional:  Positive for fatigue.  HENT:  Negative for congestion, postnasal drip and sore throat.   Respiratory:  Positive for shortness of breath (with exertion). Negative for cough and chest tightness.   Cardiovascular:  Positive for chest pain (sometimes when short of breath) and leg swelling. Negative for palpitations.  Gastrointestinal:  Negative for abdominal distention, abdominal pain and blood in stool.  Endocrine: Negative.   Genitourinary: Negative.   Musculoskeletal:  Negative for back pain and neck pain.  Skin: Negative.   Allergic/Immunologic: Negative.   Neurological:  Negative for dizziness and light-headedness.  Hematological:  Negative for adenopathy. Bruises/bleeds easily.  Psychiatric/Behavioral:  Positive for sleep disturbance (sleeping in recliner for comfort). Negative for dysphoric mood. The patient is not nervous/anxious.    Vitals:   08/29/21 1156  BP: (!) 152/63  Pulse: 80  Resp: 16  SpO2: 91%  Weight: 270 lb 6 oz (122.6 kg)  Height: 5\' 4"  (1.626 m)   Wt Readings from Last 3 Encounters:  08/29/21 270 lb 6 oz (122.6 kg)  07/24/21 281 lb (127.5 kg)  02/11/21 270 lb (122.5 kg)   Lab Results  Component Value Date   CREATININE 1.51 (H) 07/27/2021   CREATININE 1.50 (H) 07/26/2021   CREATININE 1.74 (H) 07/25/2021   Physical Exam Vitals and nursing note reviewed.  Constitutional:      Appearance: Julie [name] is well-developed.  HENT:     Head: Normocephalic and atraumatic.  Cardiovascular:     Rate and Rhythm: Normal rate and regular rhythm.  Pulmonary:     Effort: Pulmonary effort is normal.     Breath sounds: No wheezing, rhonchi or rales.   Abdominal:     Palpations: Abdomen is soft.     Tenderness: There is no abdominal tenderness.  Musculoskeletal:     Cervical back: Normal range of motion and neck supple.     Right lower leg: No tenderness. Edema (2+ pitting) present.     Left lower leg: No tenderness. Edema (2+ pitting) present.  Skin:    General: Skin is warm and dry.  Neurological:     General: No focal deficit present.     Mental Status: Julie [name] is alert and oriented to person, place, and time.  Psychiatric:        Mood and Affect: Mood normal.        Behavior: Behavior normal.   Assessment &  Plan:  1: Chronic heart failure with preserved ejection fraction without structural changes- - NYHA class II - euvolemic today - weighing daily; instructed to call for an overnight weight gain of > 2 pounds or a weekly weight gain of > 5 pounds - not adding salt to her food; reviewed the importance of following a low sodium diet and a low sodium cookbook was provided to her - trying to keep daily fluid intake to ~ 40 ounces - saw cardiology Petra Kuba) 08/09/21; returns tomorrow  2: HTN with CKD- - BP mildly elevated (152/63) - saw PCP Tressia Miners) 08/02/21 - saw nephrology Holley Raring) 08/15/21 - BMP 08/15/21 reviewed and showed sodium 141, potassium 4.6, creatinine 1.74 and GFR 31  3: DM- - saw endocrinology Honor Junes) 04/13/21 - A1c 07/24/21 was 5.4% - nonfasting glucose in clinic was 168  4: Lymphedema- - stage 2 - hasn't been wearing compression socks but wore them in the past and they cut into her leg - encouraged her to go to medical supply store and get measured for correct size so that Julie [name] can start wearing them daily with removal at bedtime - encouraged her to elevate her legs when sitting for long periods of time - consider lymphapress compression boots if edema perists   Patient did not bring her medications nor a list. Each medication was verbally reviewed with the patient and Julie [name] was encouraged to bring the  bottles to every visit to confirm accuracy of list.   Return in 6 weeks or sooner for any questions/problems before then.

## 2021-08-29 ENCOUNTER — Other Ambulatory Visit: Payer: Self-pay

## 2021-08-29 ENCOUNTER — Encounter: Payer: Self-pay | Admitting: Family

## 2021-08-29 ENCOUNTER — Ambulatory Visit: Payer: Medicare Other | Attending: Family | Admitting: Family

## 2021-08-29 VITALS — BP 152/63 | HR 80 | Resp 16 | Ht 64.0 in | Wt 270.4 lb

## 2021-08-29 DIAGNOSIS — I10 Essential (primary) hypertension: Secondary | ICD-10-CM | POA: Diagnosis not present

## 2021-08-29 DIAGNOSIS — E1136 Type 2 diabetes mellitus with diabetic cataract: Secondary | ICD-10-CM | POA: Insufficient documentation

## 2021-08-29 DIAGNOSIS — D649 Anemia, unspecified: Secondary | ICD-10-CM | POA: Insufficient documentation

## 2021-08-29 DIAGNOSIS — E1122 Type 2 diabetes mellitus with diabetic chronic kidney disease: Secondary | ICD-10-CM | POA: Diagnosis not present

## 2021-08-29 DIAGNOSIS — M810 Age-related osteoporosis without current pathological fracture: Secondary | ICD-10-CM | POA: Insufficient documentation

## 2021-08-29 DIAGNOSIS — N1832 Chronic kidney disease, stage 3b: Secondary | ICD-10-CM

## 2021-08-29 DIAGNOSIS — N182 Chronic kidney disease, stage 2 (mild): Secondary | ICD-10-CM | POA: Diagnosis not present

## 2021-08-29 DIAGNOSIS — E785 Hyperlipidemia, unspecified: Secondary | ICD-10-CM | POA: Diagnosis not present

## 2021-08-29 DIAGNOSIS — I890 Lymphedema, not elsewhere classified: Secondary | ICD-10-CM | POA: Diagnosis not present

## 2021-08-29 DIAGNOSIS — I5032 Chronic diastolic (congestive) heart failure: Secondary | ICD-10-CM | POA: Diagnosis not present

## 2021-08-29 DIAGNOSIS — I130 Hypertensive heart and chronic kidney disease with heart failure and stage 1 through stage 4 chronic kidney disease, or unspecified chronic kidney disease: Secondary | ICD-10-CM | POA: Diagnosis not present

## 2021-08-29 LAB — GLUCOSE, CAPILLARY: Glucose-Capillary: 168 mg/dL — ABNORMAL HIGH (ref 70–99)

## 2021-08-29 NOTE — Patient Instructions (Addendum)
Continue weighing daily and call for an overnight weight gain of > 2 pounds or a weekly weight gain of >5 pounds. 

## 2021-08-30 ENCOUNTER — Encounter: Payer: Self-pay | Admitting: Family

## 2021-09-16 ENCOUNTER — Emergency Department
Admission: EM | Admit: 2021-09-16 | Discharge: 2021-09-16 | Disposition: A | Discharge status: Home / Self Care | Payer: Medicare Other | Source: Home / Self Care | Attending: Emergency Medicine | Admitting: Emergency Medicine

## 2021-09-16 ENCOUNTER — Emergency Department: Payer: Medicare Other

## 2021-09-16 ENCOUNTER — Other Ambulatory Visit: Payer: Self-pay

## 2021-09-16 DIAGNOSIS — R0602 Shortness of breath: Secondary | ICD-10-CM | POA: Insufficient documentation

## 2021-09-16 DIAGNOSIS — Z7984 Long term (current) use of oral hypoglycemic drugs: Secondary | ICD-10-CM | POA: Insufficient documentation

## 2021-09-16 DIAGNOSIS — E1122 Type 2 diabetes mellitus with diabetic chronic kidney disease: Secondary | ICD-10-CM | POA: Insufficient documentation

## 2021-09-16 DIAGNOSIS — N182 Chronic kidney disease, stage 2 (mild): Secondary | ICD-10-CM | POA: Insufficient documentation

## 2021-09-16 DIAGNOSIS — M546 Pain in thoracic spine: Secondary | ICD-10-CM

## 2021-09-16 DIAGNOSIS — Z20822 Contact with and (suspected) exposure to covid-19: Secondary | ICD-10-CM | POA: Insufficient documentation

## 2021-09-16 DIAGNOSIS — I5033 Acute on chronic diastolic (congestive) heart failure: Secondary | ICD-10-CM | POA: Insufficient documentation

## 2021-09-16 DIAGNOSIS — R7989 Other specified abnormal findings of blood chemistry: Secondary | ICD-10-CM | POA: Diagnosis not present

## 2021-09-16 DIAGNOSIS — I130 Hypertensive heart and chronic kidney disease with heart failure and stage 1 through stage 4 chronic kidney disease, or unspecified chronic kidney disease: Secondary | ICD-10-CM | POA: Insufficient documentation

## 2021-09-16 DIAGNOSIS — Z79899 Other long term (current) drug therapy: Secondary | ICD-10-CM | POA: Insufficient documentation

## 2021-09-16 LAB — CBC WITH DIFFERENTIAL/PLATELET
Abs Immature Granulocytes: 0.02 10*3/uL (ref 0.00–0.07)
Basophils Absolute: 0 10*3/uL (ref 0.0–0.1)
Basophils Relative: 1 %
Eosinophils Absolute: 0.2 10*3/uL (ref 0.0–0.5)
Eosinophils Relative: 3 %
HCT: 33.9 % — ABNORMAL LOW (ref 36.0–46.0)
Hemoglobin: 10.2 g/dL — ABNORMAL LOW (ref 12.0–15.0)
Immature Granulocytes: 0 %
Lymphocytes Relative: 12 %
Lymphs Abs: 0.7 10*3/uL (ref 0.7–4.0)
MCH: 24.8 pg — ABNORMAL LOW (ref 26.0–34.0)
MCHC: 30.1 g/dL (ref 30.0–36.0)
MCV: 82.5 fL (ref 80.0–100.0)
Monocytes Absolute: 0.4 10*3/uL (ref 0.1–1.0)
Monocytes Relative: 7 %
Neutro Abs: 4.4 10*3/uL (ref 1.7–7.7)
Neutrophils Relative %: 77 %
Platelets: 170 10*3/uL (ref 150–400)
RBC: 4.11 MIL/uL (ref 3.87–5.11)
RDW: 18.2 % — ABNORMAL HIGH (ref 11.5–15.5)
WBC: 5.6 10*3/uL (ref 4.0–10.5)
nRBC: 0 % (ref 0.0–0.2)

## 2021-09-16 LAB — COMPREHENSIVE METABOLIC PANEL
ALT: 19 U/L (ref 0–44)
AST: 18 U/L (ref 15–41)
Albumin: 3.6 g/dL (ref 3.5–5.0)
Alkaline Phosphatase: 69 U/L (ref 38–126)
Anion gap: 8 (ref 5–15)
BUN: 42 mg/dL — ABNORMAL HIGH (ref 8–23)
CO2: 29 mmol/L (ref 22–32)
Calcium: 8.8 mg/dL — ABNORMAL LOW (ref 8.9–10.3)
Chloride: 100 mmol/L (ref 98–111)
Creatinine, Ser: 1.86 mg/dL — ABNORMAL HIGH (ref 0.44–1.00)
GFR, Estimated: 28 mL/min — ABNORMAL LOW (ref >60)
Glucose, Bld: 164 mg/dL — ABNORMAL HIGH (ref 70–99)
Potassium: 4.2 mmol/L (ref 3.5–5.1)
Sodium: 137 mmol/L (ref 135–145)
Total Bilirubin: 0.9 mg/dL (ref 0.3–1.2)
Total Protein: 6.7 g/dL (ref 6.5–8.1)

## 2021-09-16 LAB — URINALYSIS, COMPLETE (UACMP) WITH MICROSCOPIC
Bilirubin Urine: NEGATIVE
Glucose, UA: NEGATIVE mg/dL
Hgb urine dipstick: NEGATIVE
Ketones, ur: NEGATIVE mg/dL
Leukocytes,Ua: NEGATIVE
Nitrite: NEGATIVE
Protein, ur: 100 mg/dL — AB
Specific Gravity, Urine: 1.02 (ref 1.005–1.030)
pH: 5.5 (ref 5.0–8.0)

## 2021-09-16 LAB — TROPONIN I (HIGH SENSITIVITY)
Troponin I (High Sensitivity): 9 ng/L (ref <18)
Troponin I (High Sensitivity): 9 ng/L (ref <18)

## 2021-09-16 LAB — BRAIN NATRIURETIC PEPTIDE: B Natriuretic Peptide: 230.8 pg/mL — ABNORMAL HIGH (ref 0.0–100.0)

## 2021-09-16 LAB — RESP PANEL BY RT-PCR (FLU A&B, COVID) ARPGX2
Influenza A by PCR: NEGATIVE
Influenza B by PCR: NEGATIVE
SARS Coronavirus 2 by RT PCR: NEGATIVE

## 2021-09-16 MED ORDER — ONDANSETRON 4 MG PO TBDP
4.0000 mg | ORAL_TABLET | Freq: Once | ORAL | Status: AC
  Administered 2021-09-16: 4 mg via ORAL
  Filled 2021-09-16: qty 1

## 2021-09-16 MED ORDER — OXYCODONE-ACETAMINOPHEN 5-325 MG PO TABS
1.0000 | ORAL_TABLET | Freq: Once | ORAL | Status: AC
  Administered 2021-09-16: 1 via ORAL
  Filled 2021-09-16: qty 1

## 2021-09-16 MED ORDER — SODIUM CHLORIDE 0.9 % IV BOLUS
500.0000 mL | Freq: Once | INTRAVENOUS | Status: AC
  Administered 2021-09-16: 500 mL via INTRAVENOUS

## 2021-09-16 MED ORDER — OXYCODONE-ACETAMINOPHEN 5-325 MG PO TABS: 1.0000 | ORAL_TABLET | Freq: Four times a day (QID) | ORAL | 0 refills | Status: AC | PRN

## 2021-09-16 MED ORDER — ONDANSETRON HCL 4 MG PO TABS: 4.0000 mg | ORAL_TABLET | Freq: Three times a day (TID) | ORAL | 0 refills | Status: DC | PRN

## 2021-09-16 NOTE — ED Provider Notes (Signed)
[facility]EMERGENCY DEPARTMENT  ____________________________________________  Time seen: Approximately 5:19 PM  I have reviewed the triage vital signs and the nursing notes.   HISTORY  Chief Complaint Back Pain   Historian Patient     HPI Julie [name] is a 75 y.o. female with a history of CHF, diabetes, CKD and hypertension, presents to the emergency department with new onset shortness of breath and left-sided upper back pain.  Patient states that she did have a mechanical fall on Wednesday and had some generalized soreness but states that her pain was manageable.  She states that she coughed suddenly this afternoon and became profoundly short of breath.  She states that she has a sharp pain in her upper back.  Patient states that she has had sporadic cough for the past several days without fever.  She states that she has been taking Mucinex.  Patient has not been using any oxygen during the day or at night until today.  She is not currently anticoagulated.  Denies current chest pain but states that she occasionally has some chest tightness.   Past Medical History:  Diagnosis Date   Arthritis    Cataract    CHF (congestive heart failure) (HCC)    CKD (chronic kidney disease) stage 2, GFR 60-89 ml/min    Diabetes mellitus without complication ([place])    Hyperlipidemia    Hypertension    Morbid obesity ([place])    Osteoporosis 12/13/2013   Proteinuria 10/15/2013   referred to Nephrology     Immunizations up to date:  Yes.     Past Medical History:  Diagnosis Date   Arthritis    Cataract    CHF (congestive heart failure) (HCC)    CKD (chronic kidney disease) stage 2, GFR 60-89 ml/min    Diabetes mellitus without complication ([place])    Hyperlipidemia    Hypertension    Morbid obesity ([place])    Osteoporosis 12/13/2013   Proteinuria 10/15/2013   referred to Nephrology    Patient Active Problem List   Diagnosis Date Noted   Acute on chronic diastolic CHF (congestive  heart failure) ([place])    Anemia    Acute exacerbation of CHF (congestive heart failure) ([place]) 07/25/2021   Shortness of breath 07/24/2021   Acute kidney injury superimposed on CKD ([place]) 07/24/2021   Acute on chronic respiratory failure with hypoxia ([place]) 07/24/2021   Mixed hyperlipidemia 09/29/2019   Benign essential hypertension 09/29/2019   Bilateral lower extremity edema 07/08/2019   Essential hypertension 07/29/2018   Type 2 diabetes mellitus with hyperlipidemia ([place]) 07/29/2018   Age-related osteoporosis without current pathological fracture 07/29/2018   Hyperlipidemia 07/29/2018   Stool guaiac positive 06/03/2017   Dermatitis 01/04/2017   Low back pain 06/06/2016   Medication monitoring encounter 11/30/2015   Varicose vein of leg 11/30/2015   Abnormal mammogram 08/04/2015   Cardiac murmur 07/28/2015   CKD (chronic kidney disease) stage 2, GFR 60-89 ml/min    Morbid obesity ([employer])    Osteoporosis    Proteinuria 10/15/2013    Past Surgical History:  Procedure Laterality Date   COLONOSCOPY WITH PROPOFOL N/A 07/16/2017   Procedure: COLONOSCOPY WITH PROPOFOL;  Surgeon: Robert Bellow, MD;  Location: [facility] ENDOSCOPY;  Service: Endoscopy;  Laterality: N/A;   [place] OF UTERUS  2007   ESOPHAGOGASTRODUODENOSCOPY (EGD) WITH PROPOFOL N/A 07/16/2017   Procedure: ESOPHAGOGASTRODUODENOSCOPY (EGD) WITH PROPOFOL;  Surgeon: Robert Bellow, MD;  Location: [facility] ENDOSCOPY;  Service: Endoscopy;  Laterality: N/A;   MOUTH SURGERY  2007   cyst   TONSILLECTOMY      Prior to Admission medications   Medication Sig Start Date End Date Taking? Authorizing Provider  ondansetron (ZOFRAN) 4 MG tablet Take 1 tablet (4 mg total) by mouth every 8 (eight) hours as needed for up to 5 days for nausea or vomiting. 09/16/21 09/21/21 Yes Vallarie Mare M, PA-C  oxyCODONE-acetaminophen (PERCOCET/ROXICET) 5-325 MG tablet Take 1 tablet by mouth every 6 (six) hours as needed for up to 3 days.  09/16/21 09/19/21 Yes Vallarie Mare M, PA-C  alendronate (FOSAMAX) 70 MG tablet Take 1 tablet (70 mg total) by mouth once a week. 05/13/20   Delsa Grana, PA-C  amLODipine (NORVASC) 10 MG tablet TAKE 1 TABLET BY MOUTH  DAILY 09/12/20   Delsa Grana, PA-C  atorvastatin (LIPITOR) 40 MG tablet Take 0.5 tablets (20 mg total) by mouth at bedtime. 06/20/20   Delsa Grana, PA-C  carvedilol (COREG) 12.5 MG tablet Take 12.5 mg by mouth 2 (two) times daily with a meal. 04/13/21 04/13/22  [provider]  Cholecalciferol 25 MCG (1000 UT) tablet Take 1,000 Units by mouth daily.    [provider]  ferrous sulfate 325 (65 FE) MG tablet Take 1 tablet (325 mg total) by mouth daily. 07/27/21 08/26/21  Loletha Grayer, MD  furosemide (LASIX) 40 MG tablet Take 1 tablet (40 mg total) by mouth 2 (two) times daily. 07/27/21   Loletha Grayer, MD  glipiZIDE (GLUCOTROL XL) 5 MG 24 hr tablet Take 1 tablet (5 mg total) by mouth daily with breakfast. 07/27/21   Loletha Grayer, MD  lisinopril (ZESTRIL) 40 MG tablet Take 1 tablet (40 mg total) by mouth daily. 05/17/20   Delsa Grana, PA-C  Multiple Vitamin (MULTI-VITAMINS) TABS Take by mouth daily.     [provider]  Omega-3 Fatty Acids (FISH OIL) 1000 MG CAPS Take 1,000 mg by mouth 2 (two) times daily.    [provider]  [facility] VERIO test strip 1 each daily. 03/26/20   [provider]    Allergies Nsaids and Penicillins  Family History  Problem Relation Age of Onset   Glaucoma Mother    Cancer Father        liver   Cancer Sister        lung, bone   COPD Sister    Heart disease Maternal Grandmother        chf   Hypertension Brother    Hypertension Maternal Uncle    Ovarian cancer Paternal Grandmother    Cancer Paternal Grandmother        ovarian cancer   Breast cancer Neg Hx     Social History Social History   Tobacco Use   Smoking status: Never   Smokeless tobacco: Never   Tobacco comments:    smoking  cessation materials not required  Vaping Use   Vaping Use: Never used  Substance Use Topics   Alcohol use: No   Drug use: No     Review of Systems  Constitutional: No fever/chills Eyes:  No discharge ENT: No upper respiratory complaints. Respiratory: Patient has cough and SOB.  Gastrointestinal:   No nausea, no vomiting.  No diarrhea.  No constipation. Musculoskeletal: Negative for musculoskeletal pain. Skin: Negative for rash, abrasions, lacerations, ecchymosis.   ____________________________________________   PHYSICAL EXAM:  VITAL SIGNS: ED Triage Vitals  Enc Vitals Group     BP 09/16/21 1652 (!) 118/57     Pulse Rate 09/16/21 1652 87  Resp 09/16/21 1652 20     Temp 09/16/21 1652 98 F (36.7 C)     Temp src --      SpO2 09/16/21 1654 (!) 84 %     Weight 09/16/21 1653 271 lb 2.7 oz (123 kg)     Height 09/16/21 1653 5\' 4"  (1.626 m)     Head Circumference --      Peak Flow --      Pain Score 09/16/21 1652 3     Pain Loc --      Pain Edu? --      Excl. in [place]? --      Constitutional: Alert and oriented. Well appearing and in no acute distress. Eyes: Conjunctivae are normal. PERRL. EOMI. Head: Atraumatic. ENT:      Nose: No congestion/rhinnorhea.      Mouth/Throat: Mucous membranes are moist.  Neck: No stridor.  No cervical spine tenderness to palpation. Cardiovascular: Normal rate, regular rhythm. Normal S1 and S2.  Good peripheral circulation. Respiratory: Normal respiratory effort without tachypnea or retractions. Lungs CTAB. Good air entry to the bases with no decreased or absent breath sounds Gastrointestinal: Bowel sounds x 4 quadrants. Soft and nontender to palpation. No guarding or rigidity. No distention. Musculoskeletal: Full range of motion to all extremities. No obvious deformities noted Neurologic:  Normal for age. No gross focal neurologic deficits are appreciated.  Skin:  Skin is warm, dry and intact. No rash noted. Psychiatric: Mood and affect  are normal for age. Speech and behavior are normal.   ____________________________________________   LABS (all labs ordered are listed, but only abnormal results are displayed)  Labs Reviewed  CBC WITH DIFFERENTIAL/PLATELET - Abnormal; Notable for the following components:      Result Value   Hemoglobin 10.2 (*)    HCT 33.9 (*)    MCH 24.8 (*)    RDW 18.2 (*)    All other components within normal limits  COMPREHENSIVE METABOLIC PANEL - Abnormal; Notable for the following components:   Glucose, Bld 164 (*)    BUN 42 (*)    Creatinine, Ser 1.86 (*)    Calcium 8.8 (*)    GFR, Estimated 28 (*)    All other components within normal limits  URINALYSIS, COMPLETE (UACMP) WITH MICROSCOPIC - Abnormal; Notable for the following components:   APPearance CLEAR (*)    Protein, ur 100 (*)    Bacteria, UA RARE (*)    All other components within normal limits  BRAIN NATRIURETIC PEPTIDE - Abnormal; Notable for the following components:   B Natriuretic Peptide 230.8 (*)    All other components within normal limits  RESP PANEL BY RT-PCR (FLU A&B, COVID) ARPGX2  TROPONIN I (HIGH SENSITIVITY)  TROPONIN I (HIGH SENSITIVITY)   ____________________________________________  EKG   ____________________________________________  RADIOLOGY Unk Pinto, personally viewed and evaluated these images (plain radiographs) as part of my medical decision making, as well as reviewing the written report by the radiologist.    CT CHEST WO CONTRAST  Result Date: 09/16/2021 CLINICAL DATA:  Back pain following coughing,, history of recent fall, initial encounter initial encounter EXAM: CT CHEST WITHOUT CONTRAST TECHNIQUE: Multidetector CT imaging of the chest was performed following the standard protocol without IV contrast. COMPARISON:  07/24/2021 FINDINGS: Cardiovascular: Limited due to lack of IV contrast. Atherosclerotic calcifications of the aorta are noted without aneurysmal dilatation. The heart is  at the upper limits of normal in size. Coronary calcifications are seen. No enlargement of pulmonary  artery is noted. Mediastinum/Nodes: Thoracic inlet is within normal limits. Scattered small mediastinal lymph nodes are noted stable from a prior CT from 07/07/2021. These are likely reactive in nature but not significant by size criteria. The esophagus as visualized is within normal limits. Lungs/Pleura: Bibasilar atelectatic changes are noted. No sizable effusion is seen. Some mosaic attenuation is noted throughout both lungs stable in appearance from the prior exam. No focal nodule is seen. No confluent infiltrate is noted. Upper Abdomen: Multiple gallstones are noted in the right upper quadrant. Remainder of the upper abdomen appears within normal limits. Musculoskeletal: No acute rib abnormality is noted. No acute compression deformity is seen. No sternal fracture is noted. IMPRESSION: Cholelithiasis without complicating factors. No compression deformity or rib fracture is noted. Stable changes of mosaic attenuation within the lungs likely representing some air trapping. No focal confluent infiltrate is seen. Mild bibasilar atelectasis is noted. Aortic Atherosclerosis (ICD10-I70.0). Electronically Signed   By: Inez Catalina M.D.   On: 09/16/2021 19:18    ____________________________________________    PROCEDURES  Procedure(s) performed:     Procedures     Medications  sodium chloride 0.9 % bolus 500 mL (0 mLs Intravenous Stopped 09/16/21 1944)  oxyCODONE-acetaminophen (PERCOCET/ROXICET) 5-325 MG per tablet 1 tablet (1 tablet Oral Given 09/16/21 1943)  ondansetron (ZOFRAN-ODT) disintegrating tablet 4 mg (4 mg Oral Given 09/16/21 1943)     ____________________________________________   INITIAL IMPRESSION / ASSESSMENT AND PLAN / ED COURSE  Pertinent labs & imaging results that were available during my care of the patient were reviewed by me and considered in my medical decision making (see  chart for details).  Clinical Course as of 09/16/21 2158  Sat Sep 16, 2021  1845 Comprehensive metabolic panel(!) [JW]  [facility] 24.8 [JW]    Clinical Course User Index [JW] Vallarie Mare M, PA-C     Assessment and plan: Cough Shortness of breath 75 year old female with past medical history detailed above presents to the emergency department with chest wall pain and shortness of breath.  Vital signs were reassuring at triage.  Patient was resting comfortably.  She was notably hypoxic at 84% on room air and was placed on 2 L by nasal cannula in triage.  She did have breath sounds in the lung bases bilaterally. O2 sats improved to 92% on 2 L.   Differential diagnosis includes pneumothorax, PE, rib fracture, costochondritis, muscle strain...  Patient unable to undergo CTA given GFR of 28.  Chest CT without contrast showed no evidence of pneumothorax or rib fracture. Patient's pain resolved with percocet.   Urinalysis showed proteinuria but no signs of UTI. Patient's creatinine was mildly elevated from baseline at 1.86, slightly increased from 1.51 a month ago. Patient was given 500 mLs of normal saline in the emergency department.   At plan to admit patient to the hospitalist service as patient had new hypoxia today and needed pain control.  Patient stated that her pain resolved after Percocet and she refused admission stating that she would return to the emergency department if her symptoms seem to be worsening at home.  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  Patient was accompanied by her pastor who will take patient home.     ____________________________________________  FINAL CLINICAL IMPRESSION(S) / ED DIAGNOSES  Final diagnoses:  Acute left-sided thoracic back pain      NEW MEDICATIONS STARTED DURING THIS VISIT:  ED  Discharge Orders          Ordered    oxyCODONE-acetaminophen  (PERCOCET/ROXICET) 5-325 MG tablet  Every 6 hours PRN        09/16/21 2132    ondansetron (ZOFRAN) 4 MG tablet  Every 8 hours PRN        09/16/21 2132                This chart was dictated using voice recognition software/Dragon. Despite best efforts to proofread, errors can occur which can change the meaning. Any change was purely unintentional.     Lannie Fields, PA-C 09/16/21 2203    Vladimir Crofts, MD 09/17/21 [phone]

## 2021-09-16 NOTE — ED Triage Notes (Signed)
Pt reports mid back pain that worsened this afternoon after coughing.  Also reports fall on Wednesday after missing a stepp , states had been sore in that area since Wednesday.  NAD noted

## 2021-09-16 NOTE — ED Notes (Signed)
CT called regarding GFR results and inability to do contrast based on results. EDP notified.

## 2021-09-16 NOTE — ED Notes (Signed)
Pt 84% on RA in triage, states wears O2 at home as needed. Placed on 2L [place] in triage

## 2021-09-19 ENCOUNTER — Inpatient Hospital Stay
Admission: EM | Admit: 2021-09-19 | Discharge: 2021-09-25 | DRG: 291 | Disposition: A | Discharge status: Home Health | Payer: Medicare Other | Attending: Internal Medicine | Admitting: Internal Medicine

## 2021-09-19 ENCOUNTER — Emergency Department: Payer: Medicare Other

## 2021-09-19 ENCOUNTER — Other Ambulatory Visit: Payer: Self-pay

## 2021-09-19 DIAGNOSIS — E782 Mixed hyperlipidemia: Secondary | ICD-10-CM | POA: Diagnosis present

## 2021-09-19 DIAGNOSIS — Z7984 Long term (current) use of oral hypoglycemic drugs: Secondary | ICD-10-CM

## 2021-09-19 DIAGNOSIS — G9341 Metabolic encephalopathy: Secondary | ICD-10-CM | POA: Diagnosis present

## 2021-09-19 DIAGNOSIS — J9621 Acute and chronic respiratory failure with hypoxia: Secondary | ICD-10-CM | POA: Diagnosis present

## 2021-09-19 DIAGNOSIS — Z66 Do not resuscitate: Secondary | ICD-10-CM | POA: Diagnosis present

## 2021-09-19 DIAGNOSIS — N2581 Secondary hyperparathyroidism of renal origin: Secondary | ICD-10-CM | POA: Diagnosis present

## 2021-09-19 DIAGNOSIS — R7989 Other specified abnormal findings of blood chemistry: Secondary | ICD-10-CM | POA: Diagnosis present

## 2021-09-19 DIAGNOSIS — E871 Hypo-osmolality and hyponatremia: Secondary | ICD-10-CM | POA: Diagnosis not present

## 2021-09-19 DIAGNOSIS — Y92009 Unspecified place in unspecified non-institutional (private) residence as the place of occurrence of the external cause: Secondary | ICD-10-CM

## 2021-09-19 DIAGNOSIS — I214 Non-ST elevation (NSTEMI) myocardial infarction: Principal | ICD-10-CM

## 2021-09-19 DIAGNOSIS — Z7983 Long term (current) use of bisphosphonates: Secondary | ICD-10-CM | POA: Diagnosis not present

## 2021-09-19 DIAGNOSIS — Z825 Family history of asthma and other chronic lower respiratory diseases: Secondary | ICD-10-CM

## 2021-09-19 DIAGNOSIS — I5033 Acute on chronic diastolic (congestive) heart failure: Secondary | ICD-10-CM | POA: Diagnosis present

## 2021-09-19 DIAGNOSIS — B179 Acute viral hepatitis, unspecified: Secondary | ICD-10-CM | POA: Diagnosis not present

## 2021-09-19 DIAGNOSIS — E875 Hyperkalemia: Secondary | ICD-10-CM | POA: Diagnosis present

## 2021-09-19 DIAGNOSIS — Z79899 Other long term (current) drug therapy: Secondary | ICD-10-CM

## 2021-09-19 DIAGNOSIS — Z880 Allergy status to penicillin: Secondary | ICD-10-CM | POA: Diagnosis not present

## 2021-09-19 DIAGNOSIS — Z83511 Family history of glaucoma: Secondary | ICD-10-CM

## 2021-09-19 DIAGNOSIS — Z8041 Family history of malignant neoplasm of ovary: Secondary | ICD-10-CM

## 2021-09-19 DIAGNOSIS — E878 Other disorders of electrolyte and fluid balance, not elsewhere classified: Secondary | ICD-10-CM | POA: Diagnosis present

## 2021-09-19 DIAGNOSIS — J189 Pneumonia, unspecified organism: Secondary | ICD-10-CM | POA: Diagnosis present

## 2021-09-19 DIAGNOSIS — Z6841 Body Mass Index (BMI) 40.0 and over, adult: Secondary | ICD-10-CM

## 2021-09-19 DIAGNOSIS — I248 Other forms of acute ischemic heart disease: Secondary | ICD-10-CM | POA: Diagnosis present

## 2021-09-19 DIAGNOSIS — N179 Acute kidney failure, unspecified: Secondary | ICD-10-CM | POA: Diagnosis present

## 2021-09-19 DIAGNOSIS — R5381 Other malaise: Secondary | ICD-10-CM

## 2021-09-19 DIAGNOSIS — Z9981 Dependence on supplemental oxygen: Secondary | ICD-10-CM

## 2021-09-19 DIAGNOSIS — W109XXA Fall (on) (from) unspecified stairs and steps, initial encounter: Secondary | ICD-10-CM | POA: Diagnosis present

## 2021-09-19 DIAGNOSIS — D631 Anemia in chronic kidney disease: Secondary | ICD-10-CM | POA: Diagnosis present

## 2021-09-19 DIAGNOSIS — D696 Thrombocytopenia, unspecified: Secondary | ICD-10-CM | POA: Diagnosis present

## 2021-09-19 DIAGNOSIS — I130 Hypertensive heart and chronic kidney disease with heart failure and stage 1 through stage 4 chronic kidney disease, or unspecified chronic kidney disease: Secondary | ICD-10-CM | POA: Diagnosis present

## 2021-09-19 DIAGNOSIS — E1122 Type 2 diabetes mellitus with diabetic chronic kidney disease: Secondary | ICD-10-CM | POA: Diagnosis present

## 2021-09-19 DIAGNOSIS — Z888 Allergy status to other drugs, medicaments and biological substances status: Secondary | ICD-10-CM

## 2021-09-19 DIAGNOSIS — M810 Age-related osteoporosis without current pathological fracture: Secondary | ICD-10-CM | POA: Diagnosis present

## 2021-09-19 DIAGNOSIS — Z20822 Contact with and (suspected) exposure to covid-19: Secondary | ICD-10-CM | POA: Diagnosis present

## 2021-09-19 DIAGNOSIS — N1832 Chronic kidney disease, stage 3b: Secondary | ICD-10-CM | POA: Diagnosis present

## 2021-09-19 DIAGNOSIS — M4316 Spondylolisthesis, lumbar region: Secondary | ICD-10-CM | POA: Diagnosis present

## 2021-09-19 DIAGNOSIS — K8020 Calculus of gallbladder without cholecystitis without obstruction: Secondary | ICD-10-CM | POA: Diagnosis present

## 2021-09-19 DIAGNOSIS — E1169 Type 2 diabetes mellitus with other specified complication: Secondary | ICD-10-CM

## 2021-09-19 DIAGNOSIS — I509 Heart failure, unspecified: Secondary | ICD-10-CM | POA: Diagnosis not present

## 2021-09-19 DIAGNOSIS — Z8249 Family history of ischemic heart disease and other diseases of the circulatory system: Secondary | ICD-10-CM

## 2021-09-19 DIAGNOSIS — M549 Dorsalgia, unspecified: Secondary | ICD-10-CM

## 2021-09-19 LAB — COMPREHENSIVE METABOLIC PANEL
ALT: 1399 U/L — ABNORMAL HIGH (ref 0–44)
AST: 874 U/L — ABNORMAL HIGH (ref 15–41)
Albumin: 3.6 g/dL (ref 3.5–5.0)
Alkaline Phosphatase: 77 U/L (ref 38–126)
Anion gap: 13 (ref 5–15)
BUN: 77 mg/dL — ABNORMAL HIGH (ref 8–23)
CO2: 24 mmol/L (ref 22–32)
Calcium: 8.8 mg/dL — ABNORMAL LOW (ref 8.9–10.3)
Chloride: 97 mmol/L — ABNORMAL LOW (ref 98–111)
Creatinine, Ser: 4.68 mg/dL — ABNORMAL HIGH (ref 0.44–1.00)
GFR, Estimated: 9 mL/min — ABNORMAL LOW (ref >60)
Glucose, Bld: 182 mg/dL — ABNORMAL HIGH (ref 70–99)
Potassium: 5.7 mmol/L — ABNORMAL HIGH (ref 3.5–5.1)
Sodium: 134 mmol/L — ABNORMAL LOW (ref 135–145)
Total Bilirubin: 1.1 mg/dL (ref 0.3–1.2)
Total Protein: 6.7 g/dL (ref 6.5–8.1)

## 2021-09-19 LAB — URINALYSIS, MICROSCOPIC (REFLEX)

## 2021-09-19 LAB — RESP PANEL BY RT-PCR (FLU A&B, COVID) ARPGX2
Influenza A by PCR: NEGATIVE
Influenza B by PCR: NEGATIVE
SARS Coronavirus 2 by RT PCR: NEGATIVE

## 2021-09-19 LAB — BLOOD GAS, VENOUS
Acid-base deficit: 2.5 mmol/L — ABNORMAL HIGH (ref 0.0–2.0)
Bicarbonate: 25.4 mmol/L (ref 20.0–28.0)
O2 Saturation: 81.7 %
Patient temperature: 37
pCO2, Ven: 58 mmHg (ref 44.0–60.0)
pH, Ven: 7.25 (ref 7.250–7.430)
pO2, Ven: 54 mmHg — ABNORMAL HIGH (ref 32.0–45.0)

## 2021-09-19 LAB — CBC WITH DIFFERENTIAL/PLATELET
Abs Immature Granulocytes: 0.13 10*3/uL — ABNORMAL HIGH (ref 0.00–0.07)
Basophils Absolute: 0 10*3/uL (ref 0.0–0.1)
Basophils Relative: 0 %
Eosinophils Absolute: 0 10*3/uL (ref 0.0–0.5)
Eosinophils Relative: 0 %
HCT: 34.9 % — ABNORMAL LOW (ref 36.0–46.0)
Hemoglobin: 10.6 g/dL — ABNORMAL LOW (ref 12.0–15.0)
Immature Granulocytes: 1 %
Lymphocytes Relative: 5 %
Lymphs Abs: 0.5 10*3/uL — ABNORMAL LOW (ref 0.7–4.0)
MCH: 24.9 pg — ABNORMAL LOW (ref 26.0–34.0)
MCHC: 30.4 g/dL (ref 30.0–36.0)
MCV: 82.1 fL (ref 80.0–100.0)
Monocytes Absolute: 0.8 10*3/uL (ref 0.1–1.0)
Monocytes Relative: 7 %
Neutro Abs: 9.7 10*3/uL — ABNORMAL HIGH (ref 1.7–7.7)
Neutrophils Relative %: 87 %
Platelets: 145 10*3/uL — ABNORMAL LOW (ref 150–400)
RBC: 4.25 MIL/uL (ref 3.87–5.11)
RDW: 18.3 % — ABNORMAL HIGH (ref 11.5–15.5)
WBC: 11.1 10*3/uL — ABNORMAL HIGH (ref 4.0–10.5)
nRBC: 0.4 % — ABNORMAL HIGH (ref 0.0–0.2)

## 2021-09-19 LAB — CK: Total CK: 487 U/L — ABNORMAL HIGH (ref 38–234)

## 2021-09-19 LAB — TROPONIN I (HIGH SENSITIVITY)
Troponin I (High Sensitivity): 903 ng/L (ref <18)
Troponin I (High Sensitivity): 832 ng/L (ref <18)

## 2021-09-19 LAB — URINALYSIS, ROUTINE W REFLEX MICROSCOPIC
Glucose, UA: NEGATIVE mg/dL
Hgb urine dipstick: NEGATIVE
Leukocytes,Ua: NEGATIVE
Nitrite: NEGATIVE
Protein, ur: 100 mg/dL — AB
Specific Gravity, Urine: >1.03 — ABNORMAL HIGH (ref 1.005–1.030)
pH: 5 (ref 5.0–8.0)

## 2021-09-19 LAB — CBG MONITORING, ED: Glucose-Capillary: 174 mg/dL — ABNORMAL HIGH (ref 70–99)

## 2021-09-19 LAB — PROTIME-INR
INR: 1.3 — ABNORMAL HIGH (ref 0.8–1.2)
Prothrombin Time: 15.9 seconds — ABNORMAL HIGH (ref 11.4–15.2)

## 2021-09-19 LAB — LACTIC ACID, PLASMA: Lactic Acid, Venous: 0.9 mmol/L (ref 0.5–1.9)

## 2021-09-19 LAB — APTT: aPTT: 29 seconds (ref 24–36)

## 2021-09-19 LAB — BRAIN NATRIURETIC PEPTIDE: B Natriuretic Peptide: 848.5 pg/mL — ABNORMAL HIGH (ref 0.0–100.0)

## 2021-09-19 MED ORDER — ATORVASTATIN CALCIUM 20 MG PO TABS
20.0000 mg | ORAL_TABLET | Freq: Every day | ORAL | Status: DC
  Administered 2021-09-19: 20 mg via ORAL
  Filled 2021-09-19: qty 1

## 2021-09-19 MED ORDER — HEPARIN (PORCINE) 25000 UT/250ML-% IV SOLN
1500.0000 [IU]/h | INTRAVENOUS | Status: DC
  Administered 2021-09-19: 1200 [IU]/h via INTRAVENOUS
  Filled 2021-09-19 (×2): qty 250

## 2021-09-19 MED ORDER — ADULT MULTIVITAMIN W/MINERALS CH
1.0000 | ORAL_TABLET | Freq: Every day | ORAL | Status: DC
  Administered 2021-09-19 – 2021-09-25 (×7): 1 via ORAL
  Filled 2021-09-19 (×7): qty 1

## 2021-09-19 MED ORDER — ACETAMINOPHEN 650 MG RE SUPP
650.0000 mg | Freq: Four times a day (QID) | RECTAL | Status: DC | PRN
  Filled 2021-09-19: qty 1

## 2021-09-19 MED ORDER — MAGNESIUM HYDROXIDE 400 MG/5ML PO SUSP: 30.0000 mL | Freq: Every day | ORAL | Status: DC | PRN

## 2021-09-19 MED ORDER — FUROSEMIDE 10 MG/ML IJ SOLN
60.0000 mg | Freq: Two times a day (BID) | INTRAMUSCULAR | Status: DC
  Administered 2021-09-20 – 2021-09-25 (×11): 60 mg via INTRAVENOUS
  Filled 2021-09-19 (×7): qty 6
  Filled 2021-09-19: qty 8
  Filled 2021-09-19 (×2): qty 6
  Filled 2021-09-19: qty 8

## 2021-09-19 MED ORDER — SODIUM CHLORIDE 0.9 % IV SOLN
2.0000 g | INTRAVENOUS | Status: DC
  Administered 2021-09-19 – 2021-09-21 (×3): 2 g via INTRAVENOUS
  Filled 2021-09-19: qty 2
  Filled 2021-09-19 (×3): qty 20

## 2021-09-19 MED ORDER — OXYCODONE-ACETAMINOPHEN 5-325 MG PO TABS: 1.0000 | ORAL_TABLET | Freq: Four times a day (QID) | ORAL | Status: DC | PRN

## 2021-09-19 MED ORDER — ONDANSETRON HCL 4 MG PO TABS: 4.0000 mg | ORAL_TABLET | Freq: Three times a day (TID) | ORAL | Status: DC | PRN

## 2021-09-19 MED ORDER — OMEGA-3-ACID ETHYL ESTERS 1 G PO CAPS
1.0000 g | ORAL_CAPSULE | Freq: Every day | ORAL | Status: DC
  Administered 2021-09-20 – 2021-09-25 (×6): 1 g via ORAL
  Filled 2021-09-19 (×7): qty 1

## 2021-09-19 MED ORDER — INSULIN ASPART 100 UNIT/ML IJ SOLN
0.0000 [IU] | Freq: Three times a day (TID) | INTRAMUSCULAR | Status: DC
Start: 2021-09-20 — End: 2021-09-24
  Administered 2021-09-20: 2 [IU] via SUBCUTANEOUS
  Administered 2021-09-20 (×2): 1 [IU] via SUBCUTANEOUS
  Administered 2021-09-21 (×3): 3 [IU] via SUBCUTANEOUS
  Administered 2021-09-22 (×4): 2 [IU] via SUBCUTANEOUS
  Administered 2021-09-23: 3 [IU] via SUBCUTANEOUS
  Administered 2021-09-23 (×2): 2 [IU] via SUBCUTANEOUS
  Administered 2021-09-23: 1 [IU] via SUBCUTANEOUS
  Administered 2021-09-24: 5 [IU] via SUBCUTANEOUS
  Administered 2021-09-24: 2 [IU] via SUBCUTANEOUS
  Filled 2021-09-19 (×16): qty 1

## 2021-09-19 MED ORDER — ACETAMINOPHEN 325 MG PO TABS
650.0000 mg | ORAL_TABLET | Freq: Four times a day (QID) | ORAL | Status: DC | PRN
  Administered 2021-09-21 – 2021-09-24 (×5): 650 mg via ORAL
  Filled 2021-09-19 (×5): qty 2

## 2021-09-19 MED ORDER — TRAZODONE HCL 50 MG PO TABS
25.0000 mg | ORAL_TABLET | Freq: Every evening | ORAL | Status: DC | PRN
  Administered 2021-09-23 – 2021-09-24 (×2): 25 mg via ORAL
  Filled 2021-09-19 (×2): qty 1

## 2021-09-19 MED ORDER — SODIUM CHLORIDE 0.9 % IV SOLN
500.0000 mg | INTRAVENOUS | Status: DC
  Administered 2021-09-19 – 2021-09-20 (×2): 500 mg via INTRAVENOUS
  Filled 2021-09-19: qty 5
  Filled 2021-09-19: qty 500
  Filled 2021-09-19: qty 5

## 2021-09-19 MED ORDER — NITROGLYCERIN 0.4 MG SL SUBL: 0.4000 mg | SUBLINGUAL_TABLET | SUBLINGUAL | Status: DC | PRN

## 2021-09-19 MED ORDER — FUROSEMIDE 10 MG/ML IJ SOLN
40.0000 mg | Freq: Once | INTRAMUSCULAR | Status: AC
  Administered 2021-09-19: 40 mg via INTRAVENOUS
  Filled 2021-09-19: qty 4

## 2021-09-19 MED ORDER — GLIPIZIDE ER 5 MG PO TB24
5.0000 mg | ORAL_TABLET | Freq: Every day | ORAL | Status: DC
  Filled 2021-09-19: qty 1

## 2021-09-19 MED ORDER — AMLODIPINE BESYLATE 10 MG PO TABS
10.0000 mg | ORAL_TABLET | Freq: Every day | ORAL | Status: DC
  Administered 2021-09-19 – 2021-09-25 (×6): 10 mg via ORAL
  Filled 2021-09-19 (×2): qty 1
  Filled 2021-09-19: qty 2
  Filled 2021-09-19 (×3): qty 1

## 2021-09-19 MED ORDER — HEPARIN BOLUS VIA INFUSION
4000.0000 [IU] | Freq: Once | INTRAVENOUS | Status: AC
Start: 2021-09-19 — End: 2021-09-19
  Administered 2021-09-19: 4000 [IU] via INTRAVENOUS
  Filled 2021-09-19: qty 4000

## 2021-09-19 MED ORDER — VITAMIN D 25 MCG (1000 UNIT) PO TABS
1000.0000 [IU] | ORAL_TABLET | Freq: Every day | ORAL | Status: DC
  Administered 2021-09-19 – 2021-09-25 (×7): 1000 [IU] via ORAL
  Filled 2021-09-19 (×7): qty 1

## 2021-09-19 MED ORDER — CARVEDILOL 12.5 MG PO TABS
12.5000 mg | ORAL_TABLET | Freq: Two times a day (BID) | ORAL | Status: DC
  Administered 2021-09-20 – 2021-09-25 (×10): 12.5 mg via ORAL
  Filled 2021-09-19 (×6): qty 1
  Filled 2021-09-19: qty 2
  Filled 2021-09-19 (×3): qty 1

## 2021-09-19 MED ORDER — MORPHINE SULFATE (PF) 2 MG/ML IV SOLN: 2.0000 mg | INTRAVENOUS | Status: DC | PRN

## 2021-09-19 NOTE — H&P (Signed)
[place]   PATIENT NAME: Julie [name]    MR#:  [id number]  DATE OF BIRTH:  1945-11-23  DATE OF ADMISSION:  09/19/2021  PRIMARY CARE PHYSICIAN: Gladstone Lighter, MD   Patient is coming from: Home  REQUESTING/REFERRING PHYSICIAN: Marjean Donna, MD  CHIEF COMPLAINT:   Chief Complaint  Patient presents with   Shortness of Breath  -Altered mental status  HISTORY OF PRESENT ILLNESS:  Julie [name] is a 75 y.o. Caucasian female with medical history significant for diastolic CHF with EF more than 55% and, stage II-III chronic kidney disease, chronic respiratory failure on home O2 at 2 L/min, type 2 diabetes mellitus, hypertension, dyslipidemia and osteoporosis, who presented to the ER with acute onset of altered mental status.  Her son stated that she fell a couple of days ago and was prescribed oxycodone and Zofran for pain when she came to the ER.  She may have had a syncope and fell face forward going up the stairs per his report.  Given her confusion is not able to tell if she has been having chest pain.  He admitted to exertional dyspnea as well as lower extremity edema and orthopnea.  He thinks that she may have been more sleepy after that but today she was much worse.  EMS reports that the patient's son  tried to call her and was talking to her on the phone and she was not acting her normal self.  He stated she was talking out of her mind.  When EMS arrived she was not on her home oxygen and pulse oximetry was down to 68% and did not improve much after 6 L of O2.  She was therefore placed on her percent nonrebreather.  ED Course: When she came to the ER respiratory was 26 and pulse ox was 97% on 6 L O2 by nasal cannula with otherwise normal vital signs.  Labs revealed a VBG with pH 7.25 and HCO3 25.4 with PCO2 58.  CMP was remarkable for hyponatremia of 134 hyperkalemia 5.7 and hypochloremia of 97.  Blood glucose was 182.  BUN was 77 and creatinine 4.868 compared to 42 and  1.86 on 09/17/19/2022 and 39/1.51 on 07/27/2021.  AST was 874 up from 18 3 days ago and ALT 1399 up from 19, 3 days ago.  High-sensitivity troponin I came back 903 up from 9, 3 days ago and CK was 487.  Lactic acid was 0.9.  CBC showed WBC of 11.1 and anemia at baseline with thrombocytopenia of 145 compared to 170 recently.  Influenza antigens and COVID-19 PCR came back negative.  Blood cultures were drawn. EKG as reviewed by me : Pending Imaging: Chest x-ray showed cardiomegaly with low lung volumes causing central bronchovascular crowding with bronchitic changes without definite focal pulmonary opacity.  Chest CTA showed the following, 1. Partial consolidative changes of the left lower lobe may represent atelectasis or pneumonia. 2. Mosaic attenuation of the lung parenchyma with areas of air trapping likely representing underlying small airways versus small vessel disease. 3. Cholelithiasis. 4. Aortic Atherosclerosis. Head and C-spine CT showed the following: CT of the head: No acute intracranial abnormality noted.   CT of cervical spine: Mild loss of the normal cervical lordosis likely related to muscular spasm. No acute bony abnormality is noted.  The patient was given IV heparin bolus and drip, 40 mg IV Lasix, IV Rocephin and Zithromax.  She will be admitted to a progressive unit bed for further evaluation and management.  PAST MEDICAL HISTORY:   Past Medical History:  Diagnosis Date   Arthritis    Cataract    CHF (congestive heart failure) (HCC)    CKD (chronic kidney disease) stage 2, GFR 60-89 ml/min    Diabetes mellitus without complication ([place])    Hyperlipidemia    Hypertension    Morbid obesity ([place])    Osteoporosis 12/13/2013   Proteinuria 10/15/2013   referred to Nephrology    PAST SURGICAL HISTORY:   Past Surgical History:  Procedure Laterality Date   COLONOSCOPY WITH PROPOFOL N/A 07/16/2017   Procedure: COLONOSCOPY WITH PROPOFOL;  Surgeon: Robert Bellow,  MD;  Location: [facility] ENDOSCOPY;  Service: Endoscopy;  Laterality: N/A;   [place] OF UTERUS  2007   ESOPHAGOGASTRODUODENOSCOPY (EGD) WITH PROPOFOL N/A 07/16/2017   Procedure: ESOPHAGOGASTRODUODENOSCOPY (EGD) WITH PROPOFOL;  Surgeon: Robert Bellow, MD;  Location: [facility] ENDOSCOPY;  Service: Endoscopy;  Laterality: N/A;   MOUTH SURGERY  2007   cyst   TONSILLECTOMY      SOCIAL HISTORY:   Social History   Tobacco Use   Smoking status: Never   Smokeless tobacco: Never   Tobacco comments:    smoking cessation materials not required  Substance Use Topics   Alcohol use: No    FAMILY HISTORY:   Family History  Problem Relation Age of Onset   Glaucoma Mother    Cancer Father        liver   Cancer Sister        lung, bone   COPD Sister    Heart disease Maternal Grandmother        chf   Hypertension Brother    Hypertension Maternal Uncle    Ovarian cancer Paternal Grandmother    Cancer Paternal Grandmother        ovarian cancer   Breast cancer Neg Hx     DRUG ALLERGIES:   Allergies  Allergen Reactions   Nsaids Other (See Comments)    CKD   Penicillins Other (See Comments)    Not sure of reaction     REVIEW OF SYSTEMS:   ROS As per history of present illness. All pertinent systems were reviewed above. Constitutional, HEENT, cardiovascular, respiratory, GI, GU, musculoskeletal, neuro, psychiatric, endocrine, integumentary and hematologic systems were reviewed and are otherwise negative/unremarkable except for positive findings mentioned above in the HPI.   MEDICATIONS AT HOME:   Prior to Admission medications   Medication Sig Start Date End Date Taking? Authorizing Provider  alendronate (FOSAMAX) 70 MG tablet Take 1 tablet (70 mg total) by mouth once a week. 05/13/20   Delsa Grana, PA-C  amLODipine (NORVASC) 10 MG tablet TAKE 1 TABLET BY MOUTH  DAILY 09/12/20   Delsa Grana, PA-C  atorvastatin (LIPITOR) 40 MG tablet Take 0.5 tablets (20 mg total) by  mouth at bedtime. 06/20/20   Delsa Grana, PA-C  carvedilol (COREG) 12.5 MG tablet Take 12.5 mg by mouth 2 (two) times daily with a meal. 04/13/21 04/13/22  [provider]  Cholecalciferol 25 MCG (1000 UT) tablet Take 1,000 Units by mouth daily.    [provider]  ferrous sulfate 325 (65 FE) MG tablet Take 1 tablet (325 mg total) by mouth daily. 07/27/21 08/26/21  Loletha Grayer, MD  furosemide (LASIX) 40 MG tablet Take 1 tablet (40 mg total) by mouth 2 (two) times daily. 07/27/21   Loletha Grayer, MD  glipiZIDE (GLUCOTROL XL) 5 MG 24 hr tablet Take 1 tablet (5 mg total) by mouth daily  with breakfast. 07/27/21   Loletha Grayer, MD  lisinopril (ZESTRIL) 40 MG tablet Take 1 tablet (40 mg total) by mouth daily. 05/17/20   Delsa Grana, PA-C  Multiple Vitamin (MULTI-VITAMINS) TABS Take by mouth daily.     [provider]  Omega-3 Fatty Acids (FISH OIL) 1000 MG CAPS Take 1,000 mg by mouth 2 (two) times daily.    [provider]  ondansetron (ZOFRAN) 4 MG tablet Take 1 tablet (4 mg total) by mouth every 8 (eight) hours as needed for up to 5 days for nausea or vomiting. 09/16/21 09/21/21  Vallarie Mare M, PA-C  ONETOUCH VERIO test strip 1 each daily. 03/26/20   [provider]  oxyCODONE-acetaminophen (PERCOCET/ROXICET) 5-325 MG tablet Take 1 tablet by mouth every 6 (six) hours as needed for up to 3 days. 09/16/21 09/19/21  Lannie Fields, PA-C      VITAL SIGNS:  Blood pressure (!) 133/41, pulse 78, temperature 97.7 F (36.5 C), temperature source Oral, resp. rate (!) 25, height 5\' 5"  (1.651 m), weight 136.1 kg, SpO2 92 %.  PHYSICAL EXAMINATION:  Physical Exam  GENERAL:  75 y.o.-year-old patient lying in the bed with mild respiratory distress with conversational dyspnea.  She was globally confused.  She was alert and cooperative though. EYES: Pupils equal, round, reactive to light and accommodation. No scleral icterus. Extraocular muscles intact.  HEENT:  Head atraumatic, normocephalic. Oropharynx and nasopharynx clear.  NECK:  Supple, no jugular venous distention. No thyroid enlargement, no tenderness.  LUNGS: Diminished bibasilar breath sounds with bibasal rales.  No use of accessory muscles of respiration.  CARDIOVASCULAR: Regular rate and rhythm, S1, S2 normal. No murmurs, rubs, or gallops.  ABDOMEN: Soft, nondistended, nontender. Bowel sounds present. No organomegaly or mass.  EXTREMITIES: 1+ bilateral lower extremity pitting edema with no cyanosis, or clubbing.  NEUROLOGIC: Cranial nerves II through XII are intact. Muscle strength 5/5 in all extremities. Sensation intact. Gait not checked.  PSYCHIATRIC: The patient is alert and oriented x 3.  Normal affect and good eye contact. SKIN: No obvious rash, lesion, or ulcer.   LABORATORY PANEL:   CBC Recent Labs  Lab 09/19/21 1859  WBC 11.1*  HGB 10.6*  HCT 34.9*  PLT 145*   ------------------------------------------------------------------------------------------------------------------  Chemistries  Recent Labs  Lab 09/19/21 1859  NA 134*  K 5.7*  CL 97*  CO2 24  GLUCOSE 182*  BUN 77*  CREATININE 4.68*  CALCIUM 8.8*  AST 874*  ALT 1,399*  ALKPHOS 77  BILITOT 1.1   ------------------------------------------------------------------------------------------------------------------  Cardiac Enzymes No results for input(s): TROPONINI in the last 168 hours. ------------------------------------------------------------------------------------------------------------------  RADIOLOGY:  CT HEAD WO CONTRAST (5MM)  Result Date: 09/19/2021 CLINICAL DATA:  Altered mental status, initial encounter EXAM: CT HEAD WITHOUT CONTRAST CT CERVICAL SPINE WITHOUT CONTRAST TECHNIQUE: Multidetector CT imaging of the head and cervical spine was performed following the standard protocol without intravenous contrast. Multiplanar CT image reconstructions of the cervical spine were also generated.  COMPARISON:  07/03/2018 FINDINGS: CT HEAD FINDINGS Brain: No evidence of acute infarction, hemorrhage, hydrocephalus, extra-axial collection or mass lesion/mass effect. Vascular: No hyperdense vessel or unexpected calcification. Skull: Normal. Negative for fracture or focal lesion. Sinuses/Orbits: No acute finding. Other: None. CT CERVICAL SPINE FINDINGS Alignment: Loss of normal cervical lordosis is noted likely related to muscular spasm. Skull base and vertebrae: 7 cervical segments are well visualized. Vertebral body height is well maintained. No acute fracture or acute facet abnormality is noted. The odontoid is within normal limits. Soft  tissues and spinal canal: Surrounding soft tissue structures show vascular calcifications. No focal hematoma is noted. Upper chest: Visualized lung apices are within normal limits. Other: None IMPRESSION: CT of the head: No acute intracranial abnormality noted. CT of cervical spine: Mild loss of the normal cervical lordosis likely related to muscular spasm. No acute bony abnormality is noted. Electronically Signed   By: Inez Catalina M.D.   On: 09/19/2021 19:50   CT Chest Wo Contrast  Result Date: 09/19/2021 CLINICAL DATA:  Persistent cough. EXAM: CT CHEST WITHOUT CONTRAST TECHNIQUE: Multidetector CT imaging of the chest was performed following the standard protocol without IV contrast. COMPARISON:  Chest radiograph dated 09/19/2021 and CT dated 09/16/2021. FINDINGS: Evaluation of this exam is limited in the absence of intravenous contrast as well as due to respiratory motion. Cardiovascular: Mild cardiomegaly. No pericardial effusion. Advanced 3 vessel coronary vascular calcification. Mild atherosclerotic calcification of the thoracic aorta. No aneurysmal dilatation. The central pulmonary arteries are grossly unremarkable. Mediastinum/Nodes: No hilar or mediastinal adenopathy. The esophagus is grossly unremarkable. No mediastinal fluid collection. Lungs/Pleura: There is  eventration of the left hemidiaphragm. There is partial consolidative changes of the left lower lobe which may represent atelectasis or pneumonia. Right lung base subsegmental densities may represent atelectasis. There is diffuse mosaic attenuation of the lung with areas of air trapping which may represent a underlying small airways versus small vessel disease. No pleural effusion or pneumothorax. The central airways are patent. Upper Abdomen: Multiple gallstones. Musculoskeletal: Degenerative changes of the spine. No acute osseous pathology the IMPRESSION: 1. Partial consolidative changes of the left lower lobe may represent atelectasis or pneumonia. 2. Mosaic attenuation of the lung parenchyma with areas of air trapping likely representing underlying small airways versus small vessel disease. 3. Cholelithiasis. 4. Aortic Atherosclerosis (ICD10-I70.0). Electronically Signed   By: Anner Crete M.D.   On: 09/19/2021 19:57   CT Cervical Spine Wo Contrast  Result Date: 09/19/2021 CLINICAL DATA:  Altered mental status, initial encounter EXAM: CT HEAD WITHOUT CONTRAST CT CERVICAL SPINE WITHOUT CONTRAST TECHNIQUE: Multidetector CT imaging of the head and cervical spine was performed following the standard protocol without intravenous contrast. Multiplanar CT image reconstructions of the cervical spine were also generated. COMPARISON:  07/03/2018 FINDINGS: CT HEAD FINDINGS Brain: No evidence of acute infarction, hemorrhage, hydrocephalus, extra-axial collection or mass lesion/mass effect. Vascular: No hyperdense vessel or unexpected calcification. Skull: Normal. Negative for fracture or focal lesion. Sinuses/Orbits: No acute finding. Other: None. CT CERVICAL SPINE FINDINGS Alignment: Loss of normal cervical lordosis is noted likely related to muscular spasm. Skull base and vertebrae: 7 cervical segments are well visualized. Vertebral body height is well maintained. No acute fracture or acute facet abnormality is  noted. The odontoid is within normal limits. Soft tissues and spinal canal: Surrounding soft tissue structures show vascular calcifications. No focal hematoma is noted. Upper chest: Visualized lung apices are within normal limits. Other: None IMPRESSION: CT of the head: No acute intracranial abnormality noted. CT of cervical spine: Mild loss of the normal cervical lordosis likely related to muscular spasm. No acute bony abnormality is noted. Electronically Signed   By: Inez Catalina M.D.   On: 09/19/2021 19:50   DG Chest Portable 1 View  Result Date: 09/19/2021 CLINICAL DATA:  Shortness of breath EXAM: PORTABLE CHEST 1 VIEW COMPARISON:  07/24/2021 FINDINGS: Low lung volumes. Cardiomegaly with central bronchovascular crowding. Bilateral bronchitic changes without focal opacity, pleural effusion or pneumothorax. IMPRESSION: Cardiomegaly with low lung volumes causing central bronchovascular crowding. Bronchitic  changes without definitive focal pulmonary opacity. Electronically Signed   By: Donavan Foil M.D.   On: 09/19/2021 19:06      IMPRESSION AND PLAN:  Principal Problem:   NSTEMI (non-ST elevated myocardial infarction) ([place])  1.  Acute on chronic diastolic CHF likely secondary to non-STEMI with associated acute on chronic hypoxic respiratory failure.  The patient had a fall and her CK was elevated but not consistent with  rhabdomyolysis that is proportionate to her troponin. - The patient will be admitted to a progressive unit bed. - She will be continued on IV heparin. - We will place her on aspirin, as needed sublingual nitroglycerin and morphine sulfate should she have any pain. - We will continue beta-blocker therapy with Coreg. - Statin therapy is contraindicated given her elevated CK and troponin and recent fall. - She will be diuresed with IV Lasix. - 2D echo and cardiology consult to be obtained. - I notified Dr. Nehemiah Massed who is aware about the patient.  2.  Elevated LFTs with acute  hepatitis, possibly secondary to congestive hepatopathy. - We will monitor LFTs with diuresis. - We will obtain acute hepatitis panel. - We will hold off her statin therapy.  3.  Acute kidney injury with mild hyperkalemia.  This is likely prerenal, secondary to her acute CHF. - We will monitor renal functions with diuresis. - We will avoid nephrotoxins.  4.  Essential hypertension. - We will continue her Norvasc and Coreg.  5.  Type II diabetes mellitus. - The patient will be placed on supplement coverage with NovoLog. - We will continue Glucotrol XL while monitoring for hypoglycemia.  DVT prophylaxis: IV heparin. Code Status: Full CODE STATUS was discussed with the patient and her family and she is DNR/DNI. Family Communication:  The plan of care was discussed in details with the patient (and family).  Given her guarded prognosis and multisystem organ failure the patient and the family decided to have the patient on DNR/DNR status. I answered all questions. The patient agreed to proceed with the above mentioned plan. Further management will depend upon hospital course. Disposition Plan: Back to previous home environment Consults called: Cardiology.   All the records are reviewed and case discussed with ED provider.  Status is: Inpatient  Remains inpatient appropriate because:Ongoing diagnostic testing needed not appropriate for outpatient work up, Unsafe d/c plan, IV treatments appropriate due to intensity of illness or inability to take PO, and Inpatient level of care appropriate due to severity of illness   Dispo: The patient is from: Home              Anticipated d/c is to: Home              Patient currently is not medically stable to d/c.              Difficult to place patient: No  TOTAL TIME TAKING CARE OF THIS PATIENT: 60 minutes.     Christel Mormon M.D on 09/19/2021 at 9:42 PM  [facility]   From 7 PM-7 AM, contact night-coverage [email or web address]  CC: Primary care  physician; Gladstone Lighter, MD

## 2021-09-19 NOTE — ED Provider Notes (Signed)
[name] Emergency Department Provider Note  ____________________________________________   Event Date/Time   First MD Initiated Contact with Patient 09/19/21 1837     (approximate)  I have reviewed the triage vital signs and the nursing notes.   HISTORY  Chief Complaint Shortness of Breath    HPI Julie Julie [name] is a 75 y.o. female with CHF, CKD, diabetes, hypertension, hyperlipidemia, on 2 L of oxygen unclear exactly why who comes in with concerns for altered mental status.  EMS reports patient's son tried to call and was talking to her on the phone and she was not acting her normal self.  They went to do a well check and when they were there she is now on her normal 2 L of oxygen.  Her oxygen level was 68% and did not improve much on 6 L so patient was placed on a nonrebreather.  Patient looks comfortable now with sats in the 100's.  She is alert and oriented x2.  She denies any concerns.  She denies any falls but EMS stated that they were told that she had a fall a week ago.  Unable to get full HPI due to patient's altered mental status          Past Medical History:  Diagnosis Date   Arthritis    Cataract    CHF (congestive heart failure) (HCC)    CKD (chronic kidney disease) stage 2, GFR 60-89 ml/min    Diabetes mellitus without complication ([place])    Hyperlipidemia    Hypertension    Morbid obesity ([place])    Osteoporosis 12/13/2013   Proteinuria 10/15/2013   referred to Nephrology    Patient Active Problem List   Diagnosis Date Noted   Acute on chronic diastolic CHF (congestive heart failure) ([place])    Anemia    Acute exacerbation of CHF (congestive heart failure) ([place]) 07/25/2021   Shortness of breath 07/24/2021   Acute kidney injury superimposed on CKD ([place]) 07/24/2021   Acute on chronic respiratory failure with hypoxia ([name]) 07/24/2021   Mixed hyperlipidemia 09/29/2019   Benign essential hypertension 09/29/2019   Bilateral  lower extremity edema 07/08/2019   Essential hypertension 07/29/2018   Type 2 diabetes mellitus with hyperlipidemia ([place]) 07/29/2018   Age-related osteoporosis without current pathological fracture 07/29/2018   Hyperlipidemia 07/29/2018   Stool guaiac positive 06/03/2017   Dermatitis 01/04/2017   Low back pain 06/06/2016   Medication monitoring encounter 11/30/2015   Varicose vein of leg 11/30/2015   Abnormal mammogram 08/04/2015   Cardiac murmur 07/28/2015   CKD (chronic kidney disease) stage 2, GFR 60-89 ml/min    Morbid obesity ([place])    Osteoporosis    Proteinuria 10/15/2013    Past Surgical History:  Procedure Laterality Date   COLONOSCOPY WITH PROPOFOL N/A 07/16/2017   Procedure: COLONOSCOPY WITH PROPOFOL;  Surgeon: Robert Bellow, MD;  Location: [facility] ENDOSCOPY;  Service: Endoscopy;  Laterality: N/A;   [place] OF UTERUS  2007   ESOPHAGOGASTRODUODENOSCOPY (EGD) WITH PROPOFOL N/A 07/16/2017   Procedure: ESOPHAGOGASTRODUODENOSCOPY (EGD) WITH PROPOFOL;  Surgeon: Robert Bellow, MD;  Location: [facility] ENDOSCOPY;  Service: Endoscopy;  Laterality: N/A;   MOUTH SURGERY  2007   cyst   TONSILLECTOMY      Prior to Admission medications   Medication Sig Start Date End Date Taking? Authorizing Provider  alendronate (FOSAMAX) 70 MG tablet Take 1 tablet (70 mg total) by mouth once a week. 05/13/20   Delsa Grana, PA-C  amLODipine (  NORVASC) 10 MG tablet TAKE 1 TABLET BY MOUTH  DAILY 09/12/20   Delsa Grana, PA-C  atorvastatin (LIPITOR) 40 MG tablet Take 0.5 tablets (20 mg total) by mouth at bedtime. 06/20/20   Delsa Grana, PA-C  carvedilol (COREG) 12.5 MG tablet Take 12.5 mg by mouth 2 (two) times daily with a meal. 04/13/21 04/13/22  [provider]  Cholecalciferol 25 MCG (1000 UT) tablet Take 1,000 Units by mouth daily.    [provider]  ferrous sulfate 325 (65 FE) MG tablet Take 1 tablet (325 mg total) by mouth daily. 07/27/21 08/26/21  Loletha Grayer, MD  furosemide (LASIX) 40 MG tablet Take 1 tablet (40 mg total) by mouth 2 (two) times daily. 07/27/21   Loletha Grayer, MD  glipiZIDE (GLUCOTROL XL) 5 MG 24 hr tablet Take 1 tablet (5 mg total) by mouth daily with breakfast. 07/27/21   Loletha Grayer, MD  lisinopril (ZESTRIL) 40 MG tablet Take 1 tablet (40 mg total) by mouth daily. 05/17/20   Delsa Grana, PA-C  Multiple Vitamin (MULTI-VITAMINS) TABS Take by mouth daily.     [provider]  Omega-3 Fatty Acids (FISH OIL) 1000 MG CAPS Take 1,000 mg by mouth 2 (two) times daily.    [provider]  ondansetron (ZOFRAN) 4 MG tablet Take 1 tablet (4 mg total) by mouth every 8 (eight) hours as needed for up to 5 days for nausea or vomiting. 09/16/21 09/21/21  Vallarie Mare M, PA-C  ONETOUCH VERIO test strip 1 each daily. 03/26/20   [provider]  oxyCODONE-acetaminophen (PERCOCET/ROXICET) 5-325 MG tablet Take 1 tablet by mouth every 6 (six) hours as needed for up to 3 days. 09/16/21 09/19/21  Lannie Fields, PA-C    Allergies Nsaids and Penicillins  Family History  Problem Relation Age of Onset   Glaucoma Mother    Cancer Father        liver   Cancer Sister        lung, bone   COPD Sister    Heart disease Maternal Grandmother        chf   Hypertension Brother    Hypertension Maternal Uncle    Ovarian cancer Paternal Grandmother    Cancer Paternal Grandmother        ovarian cancer   Breast cancer Neg Hx     Social History Social History   Tobacco Use   Smoking status: Never   Smokeless tobacco: Never   Tobacco comments:    smoking cessation materials not required  Vaping Use   Vaping Use: Never used  Substance Use Topics   Alcohol use: No   Drug use: No      Review of Systems Unable to get full review of systems due to patient's altered mental status ____________________________________________   PHYSICAL EXAM:  VITAL SIGNS: ED Triage Vitals  Enc Vitals Group     BP       Pulse      Resp      Temp      Temp src      SpO2      Weight      Height      Head Circumference      Peak Flow      Pain Score      Pain Loc      Pain Edu?      Excl. in [place]?     Constitutional: Alert and oriented x2.  Appears confused, unwell elevated BMI Eyes:  Conjunctivae are normal. EOMI. Head: Atraumatic. Nose: No congestion/rhinnorhea. Mouth/Throat: Mucous membranes are moist.   Neck: No stridor. Trachea Midline. FROM Cardiovascular: Normal rate, regular rhythm. Grossly normal heart sounds.  Good peripheral circulation. Respiratory: Mild increased work of breathing and will place on 6 L Gastrointestinal: Soft and nontender. No distention. No abdominal bruits.  Musculoskeletal: Edema noted bilaterally.  No joint effusions. Neurologic:  Normal speech and language. No gross focal neurologic deficits are appreciated.  Skin:  Skin is warm, dry and intact. No rash noted. Psychiatric: Mood and affect are normal. Speech and behavior are normal. GU: Deferred   ____________________________________________   LABS (all labs ordered are listed, but only abnormal results are displayed)  Labs Reviewed  CBC WITH DIFFERENTIAL/PLATELET - Abnormal; Notable for the following components:      Result Value   WBC 11.1 (*)    Hemoglobin 10.6 (*)    HCT 34.9 (*)    MCH 24.9 (*)    RDW 18.3 (*)    Platelets 145 (*)    nRBC 0.4 (*)    Neutro Abs 9.7 (*)    Lymphs Abs 0.5 (*)    Abs Immature Granulocytes 0.13 (*)    All other components within normal limits  COMPREHENSIVE METABOLIC PANEL - Abnormal; Notable for the following components:   Sodium 134 (*)    Potassium 5.7 (*)    Chloride 97 (*)    Glucose, Bld 182 (*)    BUN 77 (*)    Creatinine, Ser 4.68 (*)    Calcium 8.8 (*)    AST 874 (*)    ALT 1,399 (*)    GFR, Estimated 9 (*)    All other components within normal limits  BLOOD GAS, VENOUS - Abnormal; Notable for the following components:   pO2, Ven 54.0 (*)    Acid-base  deficit 2.5 (*)    All other components within normal limits  BRAIN NATRIURETIC PEPTIDE - Abnormal; Notable for the following components:   B Natriuretic Peptide 848.5 (*)    All other components within normal limits  URINALYSIS, ROUTINE W REFLEX MICROSCOPIC - Abnormal; Notable for the following components:   Specific Gravity, Urine >1.030 (*)    Bilirubin Urine MODERATE (*)    Ketones, ur TRACE (*)    Protein, ur 100 (*)    All other components within normal limits  PROTIME-INR - Abnormal; Notable for the following components:   Prothrombin Time 15.9 (*)    INR 1.3 (*)    All other components within normal limits  CK - Abnormal; Notable for the following components:   Total CK 487 (*)    All other components within normal limits  URINALYSIS, MICROSCOPIC (REFLEX) - Abnormal; Notable for the following components:   Bacteria, UA RARE (*)    All other components within normal limits  CBG MONITORING, ED - Abnormal; Notable for the following components:   Glucose-Capillary 174 (*)    All other components within normal limits  TROPONIN I (HIGH SENSITIVITY) - Abnormal; Notable for the following components:   Troponin I (High Sensitivity) 903 (*)    All other components within normal limits  TROPONIN I (HIGH SENSITIVITY) - Abnormal; Notable for the following components:   Troponin I (High Sensitivity) 832 (*)    All other components within normal limits  RESP PANEL BY RT-PCR (FLU A&B, COVID) ARPGX2  CULTURE, BLOOD (ROUTINE X 2)  CULTURE, BLOOD (ROUTINE X 2)  LACTIC ACID, PLASMA  APTT  BASIC METABOLIC PANEL  CBC  LACTIC ACID, PLASMA  HEPARIN LEVEL (UNFRACTIONATED)   ____________________________________________   ED ECG REPORT I, Vanessa Sullivan, the attending physician, personally viewed and interpreted this ECG.  Normal sinus rate 76, no ST elevation, no T wave versions, normal intervals ____________________________________________  RADIOLOGY Robert Bellow, personally viewed  and evaluated these images (plain radiographs) as part of my medical decision making, as well as reviewing the written report by the radiologist.  ED MD interpretation: No pulm edema on chest x-ray  Official radiology report(s): CT HEAD WO CONTRAST (5MM)  Result Date: 09/19/2021 CLINICAL DATA:  Altered mental status, initial encounter EXAM: CT HEAD WITHOUT CONTRAST CT CERVICAL SPINE WITHOUT CONTRAST TECHNIQUE: Multidetector CT imaging of the head and cervical spine was performed following the standard protocol without intravenous contrast. Multiplanar CT image reconstructions of the cervical spine were also generated. COMPARISON:  07/03/2018 FINDINGS: CT HEAD FINDINGS Brain: No evidence of acute infarction, hemorrhage, hydrocephalus, extra-axial collection or mass lesion/mass effect. Vascular: No hyperdense vessel or unexpected calcification. Skull: Normal. Negative for fracture or focal lesion. Sinuses/Orbits: No acute finding. Other: None. CT CERVICAL SPINE FINDINGS Alignment: Loss of normal cervical lordosis is noted likely related to muscular spasm. Skull base and vertebrae: 7 cervical segments are well visualized. Vertebral body height is well maintained. No acute fracture or acute facet abnormality is noted. The odontoid is within normal limits. Soft tissues and spinal canal: Surrounding soft tissue structures show vascular calcifications. No focal hematoma is noted. Upper chest: Visualized lung apices are within normal limits. Other: None IMPRESSION: CT of the head: No acute intracranial abnormality noted. CT of cervical spine: Mild loss of the normal cervical lordosis likely related to muscular spasm. No acute bony abnormality is noted. Electronically Signed   By: Inez Catalina M.D.   On: 09/19/2021 19:50   CT Chest Wo Contrast  Result Date: 09/19/2021 CLINICAL DATA:  Persistent cough. EXAM: CT CHEST WITHOUT CONTRAST TECHNIQUE: Multidetector CT imaging of the chest was performed following the  standard protocol without IV contrast. COMPARISON:  Chest radiograph dated 09/19/2021 and CT dated 09/16/2021. FINDINGS: Evaluation of this exam is limited in the absence of intravenous contrast as well as due to respiratory motion. Cardiovascular: Mild cardiomegaly. No pericardial effusion. Advanced 3 vessel coronary vascular calcification. Mild atherosclerotic calcification of the thoracic aorta. No aneurysmal dilatation. The central pulmonary arteries are grossly unremarkable. Mediastinum/Nodes: No hilar or mediastinal adenopathy. The esophagus is grossly unremarkable. No mediastinal fluid collection. Lungs/Pleura: There is eventration of the left hemidiaphragm. There is partial consolidative changes of the left lower lobe which may represent atelectasis or pneumonia. Right lung base subsegmental densities may represent atelectasis. There is diffuse mosaic attenuation of the lung with areas of air trapping which may represent a underlying small airways versus small vessel disease. No pleural effusion or pneumothorax. The central airways are patent. Upper Abdomen: Multiple gallstones. Musculoskeletal: Degenerative changes of the spine. No acute osseous pathology the IMPRESSION: 1. Partial consolidative changes of the left lower lobe may represent atelectasis or pneumonia. 2. Mosaic attenuation of the lung parenchyma with areas of air trapping likely representing underlying small airways versus small vessel disease. 3. Cholelithiasis. 4. Aortic Atherosclerosis (ICD10-I70.0). Electronically Signed   By: Anner Crete M.D.   On: 09/19/2021 19:57   CT Cervical Spine Wo Contrast  Result Date: 09/19/2021 CLINICAL DATA:  Altered mental status, initial encounter EXAM: CT HEAD WITHOUT CONTRAST CT CERVICAL SPINE WITHOUT CONTRAST TECHNIQUE: Multidetector CT imaging of the head and cervical spine was  performed following the standard protocol without intravenous contrast. Multiplanar CT image reconstructions of the  cervical spine were also generated. COMPARISON:  07/03/2018 FINDINGS: CT HEAD FINDINGS Brain: No evidence of acute infarction, hemorrhage, hydrocephalus, extra-axial collection or mass lesion/mass effect. Vascular: No hyperdense vessel or unexpected calcification. Skull: Normal. Negative for fracture or focal lesion. Sinuses/Orbits: No acute finding. Other: None. CT CERVICAL SPINE FINDINGS Alignment: Loss of normal cervical lordosis is noted likely related to muscular spasm. Skull base and vertebrae: 7 cervical segments are well visualized. Vertebral body height is well maintained. No acute fracture or acute facet abnormality is noted. The odontoid is within normal limits. Soft tissues and spinal canal: Surrounding soft tissue structures show vascular calcifications. No focal hematoma is noted. Upper chest: Visualized lung apices are within normal limits. Other: None IMPRESSION: CT of the head: No acute intracranial abnormality noted. CT of cervical spine: Mild loss of the normal cervical lordosis likely related to muscular spasm. No acute bony abnormality is noted. Electronically Signed   By: Inez Catalina M.D.   On: 09/19/2021 19:50   DG Chest Portable 1 View  Result Date: 09/19/2021 CLINICAL DATA:  Shortness of breath EXAM: PORTABLE CHEST 1 VIEW COMPARISON:  07/24/2021 FINDINGS: Low lung volumes. Cardiomegaly with central bronchovascular crowding. Bilateral bronchitic changes without focal opacity, pleural effusion or pneumothorax. IMPRESSION: Cardiomegaly with low lung volumes causing central bronchovascular crowding. Bronchitic changes without definitive focal pulmonary opacity. Electronically Signed   By: Donavan Foil M.D.   On: 09/19/2021 19:06    ____________________________________________   PROCEDURES  Procedure(s) performed (including Critical Care):  .1-3 Lead EKG Interpretation Performed by: Vanessa [place], MD Authorized by: Vanessa [place], MD     Interpretation: normal     ECG rate:   70s   ECG rate assessment: normal     Rhythm: sinus rhythm     Ectopy: none     Conduction: normal   .Critical Care Performed by: Vanessa [place], MD Authorized by: Vanessa , MD   Critical care provider statement:    Critical care time (minutes):  45   Critical care was necessary to treat or prevent imminent or life-threatening deterioration of the following conditions:  Cardiac failure   Critical care was time spent personally by me on the following activities:  Development of treatment plan with patient or surrogate, discussions with consultants, evaluation of patient's response to treatment, examination of patient, ordering and review of laboratory studies, ordering and review of radiographic studies, ordering and performing treatments and interventions, pulse oximetry, re-evaluation of patient's condition and review of old charts   ____________________________________________   INITIAL IMPRESSION / ASSESSMENT AND PLAN / ED COURSE   Julie Julie [name] was evaluated in Emergency Department on 09/19/2021 for the symptoms described in the history of present illness. She was evaluated in the context of the global COVID-19 pandemic, which necessitated consideration that the patient might be at risk for infection with the SARS-CoV-2 virus that causes COVID-19. Institutional protocols and algorithms that pertain to the evaluation of patients at risk for COVID-19 are in a state of rapid change based on information released by regulatory bodies including the CDC and federal and state organizations. These policies and algorithms were followed during the patient's care in the ED.     Pt presents with SOB.  Given patient is hypoxic patient placed on additional oxygen.  We will get a VBG to evaluate for hypercapnia given some confusion.    Differential includes: PNA-will get xray  to evaluation Anemia-CBC to evaluate ACS- will get trops Arrhythmia-Will get EKG and keep on monitor.  COVID- will  get testing per algorithm. PE-lower suspicion given no risk factors and other cause more likely  \Chest x-ray was negative and given the shortness of breath I added on a CT chest without to further evaluate her lungs.  Also added on a CT head and CT cervical given concern for possible fall.  Labs are very concerning with elevated troponin in the 900s and significant elevated BNP from her baseline.  her CT scan does not show any evidence of pulmonary edema.  They are calling a possible pneumonia.  She is afebrile does not sound like pneumonia but I have started some antibiotics just in case.  I am concerned that with her elevated troponin and evidence of endorgan damage that she had an ischemic cardiac event and is now having worsening heart failure given her leg swelling.  I suspect that we will need to trial some diuresis to see if that improves.  She has significant AKI, liver function elevation.  On repeat evaluation her abdomen is soft and nontender and low suspicion for ascending cholangitis.  Her VBG is normal therefore not hypercapnic.  CT imaging without evidence of intercranial hemorrhage.  I did consult cardiology to have them evaluate patient.  Will admit to the hospital team\   I had asked the conversation with the grandson the POA about my concern about her liver function and kidney test that this can be very serious.  He states that she would want to be DNR.  Patient's husband and another family were in the room to witness this conversation              ____________________________________________   FINAL CLINICAL IMPRESSION(S) / ED DIAGNOSES   Final diagnoses:  NSTEMI (non-ST elevated myocardial infarction) ([place])  AKI (acute kidney injury) ([place])  Elevated liver function tests     MEDICATIONS GIVEN DURING THIS VISIT:  Medications  cefTRIAXone (ROCEPHIN) 2 g in sodium chloride 0.9 % 100 mL IVPB (0 g Intravenous Stopped 09/19/21 2122)  azithromycin (ZITHROMAX) 500 mg  in sodium chloride 0.9 % 250 mL IVPB (0 mg Intravenous Stopped 09/19/21 2213)  heparin ADULT infusion 100 units/mL (25000 units/239mL) (1,200 Units/hr Intravenous New Bag/Given 09/19/21 2121)  amLODipine (NORVASC) tablet 10 mg (10 mg Oral Given 09/19/21 2343)  atorvastatin (LIPITOR) tablet 20 mg (20 mg Oral Given 09/19/21 2344)  carvedilol (COREG) tablet 12.5 mg (has no administration in time range)  glipiZIDE (GLUCOTROL XL) 24 hr tablet 5 mg (has no administration in time range)  ondansetron (ZOFRAN) tablet 4 mg (has no administration in time range)  cholecalciferol (VITAMIN D3) tablet 1,000 Units (1,000 Units Oral Given 09/19/21 2343)  multivitamin with minerals tablet 1 tablet (1 tablet Oral Given 09/19/21 2344)  omega-3 acid ethyl esters (LOVAZA) capsule 1 g (has no administration in time range)  furosemide (LASIX) injection 60 mg (has no administration in time range)  acetaminophen (TYLENOL) tablet 650 mg (has no administration in time range)    Or  acetaminophen (TYLENOL) suppository 650 mg (has no administration in time range)  traZODone (DESYREL) tablet 25 mg (has no administration in time range)  magnesium hydroxide (MILK OF MAGNESIA) suspension 30 mL (has no administration in time range)  morphine 2 MG/ML injection 2 mg (has no administration in time range)  nitroGLYCERIN (NITROSTAT) SL tablet 0.4 mg (has no administration in time range)  insulin aspart (novoLOG) injection 0-9 Units (has no  administration in time range)  heparin bolus via infusion 4,000 Units (4,000 Units Intravenous Bolus from Bag 09/19/21 2124)  furosemide (LASIX) injection 40 mg (40 mg Intravenous Given 09/19/21 2112)     ED Discharge Orders     None        Note:  This document was prepared using Dragon voice recognition software and may include unintentional dictation errors.   Vanessa Nettie, MD 09/19/21 [phone]

## 2021-09-19 NOTE — ED Triage Notes (Signed)
Pt to er room number 11, per ems son called and pt wasn't acting right so he called 911, upon arrival pt had an O2 sat of 68 and didn't improve much on 6L.  Pt arrives on 15 nrb.  Pt satting 100% on NRB

## 2021-09-19 NOTE — Consult Note (Signed)
ANTICOAGULATION CONSULT NOTE - Initial Consult  Pharmacy Consult for Heparin Indication:  NSTEMI  Allergies  Allergen Reactions   Nsaids Other (See Comments)    CKD   Penicillins Other (See Comments)    Not sure of reaction     Patient Measurements: Height: 5\' 5"  (165.1 cm) Weight: 136.1 kg (300 lb) IBW/kg (Calculated) : 57 Heparin Dosing Weight: 90.7 kg  Vital Signs: Temp: 97.7 F (36.5 C) (12/06 1844) Temp Source: Oral (12/06 1844) BP: 133/41 (12/06 2100) Pulse Rate: 78 (12/06 2100)  Labs: Recent Labs    09/19/21 1859  HGB 10.6*  HCT 34.9*  PLT 145*  APTT 29  LABPROT 15.9*  INR 1.3*  CREATININE 4.68*  TROPONINIHS 903*    Estimated Creatinine Clearance: 15 mL/min (A) (by C-G formula based on SCr of 4.68 mg/dL (H)).   Medical History: Past Medical History:  Diagnosis Date   Arthritis    Cataract    CHF (congestive heart failure) (HCC)    CKD (chronic kidney disease) stage 2, GFR 60-89 ml/min    Diabetes mellitus without complication ([place])    Hyperlipidemia    Hypertension    Morbid obesity ([place])    Osteoporosis 12/13/2013   Proteinuria 10/15/2013   referred to Nephrology    Medications:  (Not in a hospital admission)  Scheduled:   heparin  4,000 Units Intravenous Once   Infusions:   azithromycin     cefTRIAXone (ROCEPHIN)  IV 2 g (09/19/21 2052)   heparin      Assessment: Pharmacy has been consulted to initiate heparin infusion on 75yo patient with shortness of breath. Patient's troponin level was 903. No history of chronic anticoagulant use. Baseline labs: aPTT 29 sec, INR 1.3, Hgb 11.6, Plts 145  Goal of Therapy:  Heparin level 0.3-0.7 units/ml Monitor platelets by anticoagulation protocol: Yes   Plan:  Give 4000 units bolus x 1 Start heparin infusion at 1200 units/hr Check anti-Xa level in 8 hours and daily while on heparin Continue to monitor H&H and platelets  Isahia Hollerbach A Roanne Haye 09/19/2021,9:14 PM

## 2021-09-20 ENCOUNTER — Inpatient Hospital Stay: Payer: Medicare Other

## 2021-09-20 ENCOUNTER — Inpatient Hospital Stay
Admit: 2021-09-20 | Discharge: 2021-09-20 | Disposition: A | Discharge status: Home / Self Care | Payer: Medicare Other | Attending: Family Medicine | Admitting: Family Medicine

## 2021-09-20 ENCOUNTER — Encounter: Payer: Self-pay | Admitting: Family Medicine

## 2021-09-20 DIAGNOSIS — I248 Other forms of acute ischemic heart disease: Secondary | ICD-10-CM

## 2021-09-20 LAB — BLOOD CULTURE ID PANEL (REFLEXED) - BCID2

## 2021-09-20 LAB — HEPATITIS PANEL, ACUTE
HCV Ab: NONREACTIVE
Hep A IgM: NONREACTIVE
Hep B C IgM: NONREACTIVE
Hepatitis B Surface Ag: NONREACTIVE

## 2021-09-20 LAB — CBC
HCT: 31.5 % — ABNORMAL LOW (ref 36.0–46.0)
Hemoglobin: 9.7 g/dL — ABNORMAL LOW (ref 12.0–15.0)
MCH: 25.1 pg — ABNORMAL LOW (ref 26.0–34.0)
MCHC: 30.8 g/dL (ref 30.0–36.0)
MCV: 81.4 fL (ref 80.0–100.0)
Platelets: 143 10*3/uL — ABNORMAL LOW (ref 150–400)
RBC: 3.87 MIL/uL (ref 3.87–5.11)
RDW: 18.5 % — ABNORMAL HIGH (ref 11.5–15.5)
WBC: 9.8 10*3/uL (ref 4.0–10.5)
nRBC: 0.5 % — ABNORMAL HIGH (ref 0.0–0.2)

## 2021-09-20 LAB — ECHOCARDIOGRAM COMPLETE
AR max vel: 1.47 cm2
AV Area VTI: 1.55 cm2
AV Area mean vel: 1.38 cm2
AV Mean grad: 8 mmHg
AV Peak grad: 12.5 mmHg
Ao pk vel: 1.77 m/s
Area-P 1/2: 2.48 cm2
Height: 65 in
MV VTI: 1.41 cm2
S' Lateral: 3.38 cm
Weight: 4800 oz

## 2021-09-20 LAB — HEPATIC FUNCTION PANEL
ALT: 1046 U/L — ABNORMAL HIGH (ref 0–44)
AST: 485 U/L — ABNORMAL HIGH (ref 15–41)
Albumin: 3.1 g/dL — ABNORMAL LOW (ref 3.5–5.0)
Alkaline Phosphatase: 65 U/L (ref 38–126)
Bilirubin, Direct: 0.2 mg/dL (ref 0.0–0.2)
Indirect Bilirubin: 0.7 mg/dL (ref 0.3–0.9)
Total Bilirubin: 0.9 mg/dL (ref 0.3–1.2)
Total Protein: 5.9 g/dL — ABNORMAL LOW (ref 6.5–8.1)

## 2021-09-20 LAB — BASIC METABOLIC PANEL
Anion gap: 10 (ref 5–15)
BUN: 82 mg/dL — ABNORMAL HIGH (ref 8–23)
CO2: 24 mmol/L (ref 22–32)
Calcium: 8.4 mg/dL — ABNORMAL LOW (ref 8.9–10.3)
Chloride: 100 mmol/L (ref 98–111)
Creatinine, Ser: 4.41 mg/dL — ABNORMAL HIGH (ref 0.44–1.00)
GFR, Estimated: 10 mL/min — ABNORMAL LOW (ref >60)
Glucose, Bld: 143 mg/dL — ABNORMAL HIGH (ref 70–99)
Potassium: 5.1 mmol/L (ref 3.5–5.1)
Sodium: 134 mmol/L — ABNORMAL LOW (ref 135–145)

## 2021-09-20 LAB — CBG MONITORING, ED
Glucose-Capillary: 140 mg/dL — ABNORMAL HIGH (ref 70–99)
Glucose-Capillary: 120 mg/dL — ABNORMAL HIGH (ref 70–99)
Glucose-Capillary: 141 mg/dL — ABNORMAL HIGH (ref 70–99)

## 2021-09-20 LAB — HEPARIN LEVEL (UNFRACTIONATED): Heparin Unfractionated: 0.1 IU/mL — ABNORMAL LOW (ref 0.30–0.70)

## 2021-09-20 LAB — GLUCOSE, CAPILLARY: Glucose-Capillary: 192 mg/dL — ABNORMAL HIGH (ref 70–99)

## 2021-09-20 LAB — LACTIC ACID, PLASMA: Lactic Acid, Venous: 0.7 mmol/L (ref 0.5–1.9)

## 2021-09-20 MED ORDER — PERFLUTREN LIPID MICROSPHERE
1.0000 mL | INTRAVENOUS | Status: AC | PRN
Start: 2021-09-20 — End: 2021-09-20
  Administered 2021-09-20: 2 mL via INTRAVENOUS
  Filled 2021-09-20: qty 10

## 2021-09-20 MED ORDER — HEPARIN BOLUS VIA INFUSION
2700.0000 [IU] | Freq: Once | INTRAVENOUS | Status: AC
  Administered 2021-09-20: 2700 [IU] via INTRAVENOUS
  Filled 2021-09-20: qty 2700

## 2021-09-20 MED ORDER — CHLORHEXIDINE GLUCONATE CLOTH 2 % EX PADS
6.0000 | MEDICATED_PAD | Freq: Every day | CUTANEOUS | Status: DC
  Administered 2021-09-20 – 2021-09-25 (×6): 6 via TOPICAL

## 2021-09-20 NOTE — Progress Notes (Addendum)
Progress Note    NIL BOLSER  RSW:[id number] DOB: 11/24/1945  DOA: 09/19/2021 PCP: Gladstone Lighter, MD      Brief Narrative:    Medical records reviewed and are as summarized below:  Julie [name] is a 75 y.o. female with medical history significant for chronic diastolic CHF, stage IIIb CKD, chronic hypoxemic respiratory failure on 2 L/min oxygen at home, type II DM, hypertension, dyslipidemia, osteoporosis.  She presented to the hospital because of altered mental status shortness of breath that is worse with exertion, orthopnea and lower extremity edema.  History is significant for a fall at home about 2 days prior to admission.  Reportedly oxygen saturation was 68% on room air when EMS picked her up.  Her troponins evaluated ([phone]).  She was admitted to the hospital for acute on chronic diastolic CHF, acute on chronic hypoxemic respiratory failure and acute metabolic encephalopathy.  Elevated troponins were attributed to demand ischemia per cardiologist.  She was also found to have AKI and elevated liver enzymes probably from acute CHF.    Assessment/Plan:   Principal Problem:   Demand ischemia (HCC) Active Problems:   Acute on chronic respiratory failure with hypoxia (HCC)   Acute on chronic diastolic CHF (congestive heart failure) (HCC)   Body mass index is 49.92 kg/m.  (Morbid obesity)   Acute on chronic diastolic CHF: Continue IV Lasix.  Monitor BMP, daily weight and urine output.  2D echo showed EF estimated at 65 to 70%, normal LV diastolic parameters.  BNP was 848.5.  Elevated troponins: Probably from demand ischemia.  No chest pain. No plan for left heart cath.  Discontinue IV heparin.  Acute on chronic hypoxemic respiratory failure: Continue 4 L/min oxygen and taper down oxygen as able.  She uses 2 L/min oxygen at home although she is not medically adherent with oxygen therapy according to her grandson.  Left lower lobe consolidation  concerning for atelectasis or pneumonia: Continue empiric IV antibiotics for now.  Follow-up blood cultures.  AKI on CKD stage IIIb, hyperkalemia: Probably from cardiorenal syndrome.  Hyperkalemia has improved.  Consulted nephrologist to assist with management.  Elevated liver enzymes: This is probably due to acute CHF.  She has cholelithiasis noted on CT chest.  Obtain liver ultrasound for further evaluation.  Acute metabolic encephalopathy: Improving.  Type II DM: Hold glipizide.  Use NovoLog as needed for hyperglycemia.    Diet Order             Diet NPO time specified  Diet effective midnight                      Consultants: Cardiologist Nephrologist  Procedures: None    Medications:    amLODipine  10 mg Oral Daily   carvedilol  12.5 mg Oral BID WC   cholecalciferol  1,000 Units Oral Daily   furosemide  60 mg Intravenous BID   glipiZIDE  5 mg Oral Q breakfast   insulin aspart  0-9 Units Subcutaneous TID PC & HS   multivitamin with minerals  1 tablet Oral Daily   omega-3 acid ethyl esters  1 g Oral Daily   Continuous Infusions:  azithromycin Stopped (09/19/21 2213)   cefTRIAXone (ROCEPHIN)  IV Stopped (09/19/21 2122)   heparin 1,500 Units/hr (09/20/21 0831)     Anti-infectives (From admission, onward)    Start     Dose/Rate Route Frequency Ordered Stop   09/19/21 2030  cefTRIAXone (ROCEPHIN) 2 g  in sodium chloride 0.9 % 100 mL IVPB        2 g 200 mL/hr over 30 Minutes Intravenous Every 24 hours 09/19/21 2028 09/24/21 2029   09/19/21 2030  azithromycin (ZITHROMAX) 500 mg in sodium chloride 0.9 % 250 mL IVPB        500 mg 250 mL/hr over 60 Minutes Intravenous Every 24 hours 09/19/21 2028 09/24/21 2029              Family Communication/Anticipated D/C date and plan/Code Status   DVT prophylaxis:      Code Status: DNR  Family Communication: Her husband and her grandson, Harrell Gave Disposition Plan: Possible discharge to home in 2 to 3  days   Status is: Inpatient  Remains inpatient appropriate because: Acute heart failure           Subjective:   Interval events noted.  Her husband and, her grandson, Harrell Gave, were at the bedside.  Harrell Gave said patient usually has trouble breathing at night.  Objective:    Vitals:   09/20/21 0930 09/20/21 1000 09/20/21 1030 09/20/21 1130  BP: (!) 97/52 (!) 108/49 (!) 100/58 (!) 107/51  Pulse: (!) 59 65 (!) 58 66  Resp: (!) 22 (!) 24 (!) 23 (!) 22  Temp:      TempSrc:      SpO2: 95% 92% 92% 90%  Weight:      Height:       No data found.   Intake/Output Summary (Last 24 hours) at 09/20/2021 1223 Last data filed at 09/19/2021 2213 Gross per 24 hour  Intake 315.87 ml  Output --  Net 315.87 ml   Filed Weights   09/19/21 1849  Weight: 136.1 kg    Exam:  GEN: NAD SKIN: No rash EYES: EOMI ENT: MMM CV: RRR PULM: CTA B ABD: soft, obese, NT, +BS CNS: AAO x 3, non focal EXT: B/l leg edema (2+), no tenderness        Data Reviewed:   I have personally reviewed following labs and imaging studies:  Labs: Labs show the following:   Basic Metabolic Panel: Recent Labs  Lab 09/16/21 1737 09/19/21 1859 09/20/21 0451  NA 137 134* 134*  K 4.2 5.7* 5.1  CL 100 97* 100  CO2 29 24 24   GLUCOSE 164* 182* 143*  BUN 42* 77* 82*  CREATININE 1.86* 4.68* 4.41*  CALCIUM 8.8* 8.8* 8.4*   GFR Estimated Creatinine Clearance: 15.9 mL/min (A) (by C-G formula based on SCr of 4.41 mg/dL (H)). Liver Function Tests: Recent Labs  Lab 09/16/21 1737 09/19/21 1859 09/20/21 0451  AST 18 874* 485*  ALT 19 1,399* 1,046*  ALKPHOS 69 77 65  BILITOT 0.9 1.1 0.9  PROT 6.7 6.7 5.9*  ALBUMIN 3.6 3.6 3.1*   No results for input(s): LIPASE, AMYLASE in the last 168 hours. No results for input(s): AMMONIA in the last 168 hours. Coagulation profile Recent Labs  Lab 09/19/21 1859  INR 1.3*    CBC: Recent Labs  Lab 09/16/21 1737 09/19/21 1859 09/20/21 0451   WBC 5.6 11.1* 9.8  NEUTROABS 4.4 9.7*  --   HGB 10.2* 10.6* 9.7*  HCT 33.9* 34.9* 31.5*  MCV 82.5 82.1 81.4  PLT 170 145* 143*   Cardiac Enzymes: Recent Labs  Lab 09/19/21 1859  CKTOTAL 487*   BNP (last 3 results) No results for input(s): PROBNP in the last 8760 hours. CBG: Recent Labs  Lab 09/19/21 1906 09/20/21 0800 09/20/21 1131  GLUCAP 174* 140* 120*  D-Dimer: No results for input(s): DDIMER in the last 72 hours. Hgb A1c: No results for input(s): HGBA1C in the last 72 hours. Lipid Profile: No results for input(s): CHOL, HDL, LDLCALC, TRIG, CHOLHDL, LDLDIRECT in the last 72 hours. Thyroid function studies: No results for input(s): TSH, T4TOTAL, T3FREE, THYROIDAB in the last 72 hours.  Invalid input(s): FREET3 Anemia work up: No results for input(s): VITAMINB12, FOLATE, FERRITIN, TIBC, IRON, RETICCTPCT in the last 72 hours. Sepsis Labs: Recent Labs  Lab 09/16/21 1737 09/19/21 1859 09/19/21 2043 09/20/21 0451  WBC 5.6 11.1*  --  9.8  LATICACIDVEN  --   --  0.9 0.7    Microbiology Recent Results (from the past 240 hour(s))  Resp Panel by RT-PCR (Flu A&B, Covid) Nasopharyngeal Swab     Status: None   Collection Time: 09/16/21  7:45 PM   Specimen: Nasopharyngeal Swab; Nasopharyngeal(NP) swabs in vial transport medium  Result Value Ref Range Status   SARS Coronavirus 2 by RT PCR NEGATIVE NEGATIVE Final    Comment: (NOTE) SARS-CoV-2 target nucleic acids are NOT DETECTED.  The SARS-CoV-2 RNA is generally detectable in upper respiratory specimens during the acute phase of infection. The lowest concentration of SARS-CoV-2 viral copies this assay can detect is 138 copies/mL. A negative result does not preclude SARS-Cov-2 infection and should not be used as the sole basis for treatment or other patient management decisions. A negative result may occur with  improper specimen collection/handling, submission of specimen other than nasopharyngeal swab, presence  of viral mutation(s) within the areas targeted by this assay, and inadequate number of viral copies(<138 copies/mL). A negative result must be combined with clinical observations, patient history, and epidemiological information. The expected result is Negative.  Fact Sheet for Patients:  EntrepreneurPulse.com.au  Fact Sheet for Healthcare Providers:  IncredibleEmployment.be  This test is no t yet approved or cleared by the [place] FDA and  has been authorized for detection and/or diagnosis of SARS-CoV-2 by FDA under an Emergency Use Authorization (EUA). This EUA will remain  in effect (meaning this test can be used) for the duration of the COVID-19 declaration under Section 564(b)(1) of the Act, 21 U.S.C.section 360bbb-3(b)(1), unless the authorization is terminated  or revoked sooner.       Influenza A by PCR NEGATIVE NEGATIVE Final   Influenza B by PCR NEGATIVE NEGATIVE Final    Comment: (NOTE) The Xpert Xpress SARS-CoV-2/FLU/RSV plus assay is intended as an aid in the diagnosis of influenza from Nasopharyngeal swab specimens and should not be used as a sole basis for treatment. Nasal washings and aspirates are unacceptable for Xpert Xpress SARS-CoV-2/FLU/RSV testing.  Fact Sheet for Patients: EntrepreneurPulse.com.au  Fact Sheet for Healthcare Providers: IncredibleEmployment.be  This test is not yet approved or cleared by the [place] FDA and has been authorized for detection and/or diagnosis of SARS-CoV-2 by FDA under an Emergency Use Authorization (EUA). This EUA will remain in effect (meaning this test can be used) for the duration of the COVID-19 declaration under Section 564(b)(1) of the Act, 21 U.S.C. section 360bbb-3(b)(1), unless the authorization is terminated or revoked.  Performed at [facility], [address] [place], [place] [address]   Resp Panel by RT-PCR  (Flu A&B, Covid) Nasopharyngeal Swab     Status: None   Collection Time: 09/19/21  6:59 PM   Specimen: Nasopharyngeal Swab; Nasopharyngeal(NP) swabs in vial transport medium  Result Value Ref Range Status   SARS Coronavirus 2 by RT PCR NEGATIVE NEGATIVE Final  Comment: (NOTE) SARS-CoV-2 target nucleic acids are NOT DETECTED.  The SARS-CoV-2 RNA is generally detectable in upper respiratory specimens during the acute phase of infection. The lowest concentration of SARS-CoV-2 viral copies this assay can detect is 138 copies/mL. A negative result does not preclude SARS-Cov-2 infection and should not be used as the sole basis for treatment or other patient management decisions. A negative result may occur with  improper specimen collection/handling, submission of specimen other than nasopharyngeal swab, presence of viral mutation(s) within the areas targeted by this assay, and inadequate number of viral copies(<138 copies/mL). A negative result must be combined with clinical observations, patient history, and epidemiological information. The expected result is Negative.  Fact Sheet for Patients:  EntrepreneurPulse.com.au  Fact Sheet for Healthcare Providers:  IncredibleEmployment.be  This test is no t yet approved or cleared by the [place] FDA and  has been authorized for detection and/or diagnosis of SARS-CoV-2 by FDA under an Emergency Use Authorization (EUA). This EUA will remain  in effect (meaning this test can be used) for the duration of the COVID-19 declaration under Section 564(b)(1) of the Act, 21 U.S.C.section 360bbb-3(b)(1), unless the authorization is terminated  or revoked sooner.       Influenza A by PCR NEGATIVE NEGATIVE Final   Influenza B by PCR NEGATIVE NEGATIVE Final    Comment: (NOTE) The Xpert Xpress SARS-CoV-2/FLU/RSV plus assay is intended as an aid in the diagnosis of influenza from Nasopharyngeal swab specimens  and should not be used as a sole basis for treatment. Nasal washings and aspirates are unacceptable for Xpert Xpress SARS-CoV-2/FLU/RSV testing.  Fact Sheet for Patients: EntrepreneurPulse.com.au  Fact Sheet for Healthcare Providers: IncredibleEmployment.be  This test is not yet approved or cleared by the [place] FDA and has been authorized for detection and/or diagnosis of SARS-CoV-2 by FDA under an Emergency Use Authorization (EUA). This EUA will remain in effect (meaning this test can be used) for the duration of the COVID-19 declaration under Section 564(b)(1) of the Act, 21 U.S.C. section 360bbb-3(b)(1), unless the authorization is terminated or revoked.  Performed at [facility], [address] [place], [place] [address]   Blood culture (routine x 2)     Status: None (Preliminary result)   Collection Time: 09/19/21  8:43 PM   Specimen: BLOOD  Result Value Ref Range Status   Specimen Description BLOOD RAC  Final   Special Requests   Final    BOTTLES DRAWN AEROBIC AND ANAEROBIC Blood Culture adequate volume   Culture   Final    NO GROWTH < 12 HOURS Performed at [facility], [address], [place], [place] [address]    Report Status PENDING  Incomplete  Blood culture (routine x 2)     Status: None (Preliminary result)   Collection Time: 09/19/21  8:43 PM   Specimen: BLOOD  Result Value Ref Range Status   Specimen Description BLOOD LAC  Final   Special Requests   Final    BOTTLES DRAWN AEROBIC AND ANAEROBIC Blood Culture adequate volume   Culture   Final    NO GROWTH < 12 HOURS Performed at [facility], [address] [place], [place] [address]    Report Status PENDING  Incomplete    Procedures and diagnostic studies:  CT HEAD WO CONTRAST (5MM)  Result Date: 09/19/2021 CLINICAL DATA:  Altered mental status, initial encounter EXAM: CT HEAD WITHOUT CONTRAST CT CERVICAL SPINE WITHOUT  CONTRAST TECHNIQUE: Multidetector CT imaging of the head and  cervical spine was performed following the standard protocol without intravenous contrast. Multiplanar CT image reconstructions of the cervical spine were also generated. COMPARISON:  07/03/2018 FINDINGS: CT HEAD FINDINGS Brain: No evidence of acute infarction, hemorrhage, hydrocephalus, extra-axial collection or mass lesion/mass effect. Vascular: No hyperdense vessel or unexpected calcification. Skull: Normal. Negative for fracture or focal lesion. Sinuses/Orbits: No acute finding. Other: None. CT CERVICAL SPINE FINDINGS Alignment: Loss of normal cervical lordosis is noted likely related to muscular spasm. Skull base and vertebrae: 7 cervical segments are well visualized. Vertebral body height is well maintained. No acute fracture or acute facet abnormality is noted. The odontoid is within normal limits. Soft tissues and spinal canal: Surrounding soft tissue structures show vascular calcifications. No focal hematoma is noted. Upper chest: Visualized lung apices are within normal limits. Other: None IMPRESSION: CT of the head: No acute intracranial abnormality noted. CT of cervical spine: Mild loss of the normal cervical lordosis likely related to muscular spasm. No acute bony abnormality is noted. Electronically Signed   By: Inez Catalina M.D.   On: 09/19/2021 19:50   CT Chest Wo Contrast  Result Date: 09/19/2021 CLINICAL DATA:  Persistent cough. EXAM: CT CHEST WITHOUT CONTRAST TECHNIQUE: Multidetector CT imaging of the chest was performed following the standard protocol without IV contrast. COMPARISON:  Chest radiograph dated 09/19/2021 and CT dated 09/16/2021. FINDINGS: Evaluation of this exam is limited in the absence of intravenous contrast as well as due to respiratory motion. Cardiovascular: Mild cardiomegaly. No pericardial effusion. Advanced 3 vessel coronary vascular calcification. Mild atherosclerotic calcification of the thoracic aorta. No  aneurysmal dilatation. The central pulmonary arteries are grossly unremarkable. Mediastinum/Nodes: No hilar or mediastinal adenopathy. The esophagus is grossly unremarkable. No mediastinal fluid collection. Lungs/Pleura: There is eventration of the left hemidiaphragm. There is partial consolidative changes of the left lower lobe which may represent atelectasis or pneumonia. Right lung base subsegmental densities may represent atelectasis. There is diffuse mosaic attenuation of the lung with areas of air trapping which may represent a underlying small airways versus small vessel disease. No pleural effusion or pneumothorax. The central airways are patent. Upper Abdomen: Multiple gallstones. Musculoskeletal: Degenerative changes of the spine. No acute osseous pathology the IMPRESSION: 1. Partial consolidative changes of the left lower lobe may represent atelectasis or pneumonia. 2. Mosaic attenuation of the lung parenchyma with areas of air trapping likely representing underlying small airways versus small vessel disease. 3. Cholelithiasis. 4. Aortic Atherosclerosis (ICD10-I70.0). Electronically Signed   By: Anner Crete M.D.   On: 09/19/2021 19:57   CT Cervical Spine Wo Contrast  Result Date: 09/19/2021 CLINICAL DATA:  Altered mental status, initial encounter EXAM: CT HEAD WITHOUT CONTRAST CT CERVICAL SPINE WITHOUT CONTRAST TECHNIQUE: Multidetector CT imaging of the head and cervical spine was performed following the standard protocol without intravenous contrast. Multiplanar CT image reconstructions of the cervical spine were also generated. COMPARISON:  07/03/2018 FINDINGS: CT HEAD FINDINGS Brain: No evidence of acute infarction, hemorrhage, hydrocephalus, extra-axial collection or mass lesion/mass effect. Vascular: No hyperdense vessel or unexpected calcification. Skull: Normal. Negative for fracture or focal lesion. Sinuses/Orbits: No acute finding. Other: None. CT CERVICAL SPINE FINDINGS Alignment: Loss  of normal cervical lordosis is noted likely related to muscular spasm. Skull base and vertebrae: 7 cervical segments are well visualized. Vertebral body height is well maintained. No acute fracture or acute facet abnormality is noted. The odontoid is within normal limits. Soft tissues and spinal canal: Surrounding soft tissue structures show vascular calcifications. No focal hematoma is  noted. Upper chest: Visualized lung apices are within normal limits. Other: None IMPRESSION: CT of the head: No acute intracranial abnormality noted. CT of cervical spine: Mild loss of the normal cervical lordosis likely related to muscular spasm. No acute bony abnormality is noted. Electronically Signed   By: Inez Catalina M.D.   On: 09/19/2021 19:50   DG Chest Portable 1 View  Result Date: 09/19/2021 CLINICAL DATA:  Shortness of breath EXAM: PORTABLE CHEST 1 VIEW COMPARISON:  07/24/2021 FINDINGS: Low lung volumes. Cardiomegaly with central bronchovascular crowding. Bilateral bronchitic changes without focal opacity, pleural effusion or pneumothorax. IMPRESSION: Cardiomegaly with low lung volumes causing central bronchovascular crowding. Bronchitic changes without definitive focal pulmonary opacity. Electronically Signed   By: Donavan Foil M.D.   On: 09/19/2021 19:06   ECHOCARDIOGRAM COMPLETE  Result Date: 09/20/2021    ECHOCARDIOGRAM REPORT   Patient Name:   CLOA BUSHONG Date of Exam: 09/20/2021 Medical Rec #:  [id number]         Height:       65.0 in Accession #:    [id number]        Weight:       300.0 lb Date of Birth:  [birth year]         BSA:          2.350 m Patient Age:    89 years          BP:           116/52 mmHg Patient Gender: F                 HR:           64 bpm. Exam Location:  [facility] Procedure: 2D Echo, Color Doppler, Cardiac Doppler and Intracardiac            Opacification Agent Indications:     I21.4 NSTEMI  History:         Patient has no prior history of Echocardiogram examinations.                   CHF, CKD; Risk Factors:Hypertension, Dyslipidemia and Diabetes.  Sonographer:     Charmayne Sheer Referring Phys:  [id number] [place] Diagnosing Phys: Serafina Royals MD  Sonographer Comments: Technically difficult study due to poor echo windows. Image acquisition challenging due to patient body habitus. IMPRESSIONS  1. Left ventricular ejection fraction, by estimation, is 65 to 70%. The left ventricle has normal function. The left ventricle has no regional wall motion abnormalities. Left ventricular diastolic parameters were normal.  2. Right ventricular systolic function is normal. The right ventricular size is normal.  3. The mitral valve is normal in structure. Mild mitral valve regurgitation.  4. The aortic valve is normal in structure. Aortic valve regurgitation is not visualized. FINDINGS  Left Ventricle: Left ventricular ejection fraction, by estimation, is 65 to 70%. The left ventricle has normal function. The left ventricle has no regional wall motion abnormalities. Definity contrast agent was given IV to delineate the left ventricular  endocardial borders. The left ventricular internal cavity size was small. There is no left ventricular hypertrophy. Left ventricular diastolic parameters were normal. Right Ventricle: The right ventricular size is normal. No increase in right ventricular wall thickness. Right ventricular systolic function is normal. Left Atrium: Left atrial size was normal in size. Right Atrium: Right atrial size was normal in size. Pericardium: There is no evidence of pericardial effusion. Mitral Valve: The mitral valve is normal in structure. Mild  mitral valve regurgitation. MV peak gradient, 6.0 mmHg. The mean mitral valve gradient is 2.0 mmHg. Tricuspid Valve: The tricuspid valve is normal in structure. Tricuspid valve regurgitation is trivial. Aortic Valve: The aortic valve is normal in structure. Aortic valve regurgitation is not visualized. Aortic valve mean gradient measures 8.0 mmHg.  Aortic valve peak gradient measures 12.5 mmHg. Aortic valve area, by VTI measures 1.55 cm. Pulmonic Valve: The pulmonic valve was normal in structure. Pulmonic valve regurgitation is not visualized. Aorta: The aortic root and ascending aorta are structurally normal, with no evidence of dilitation. IAS/Shunts: No atrial level shunt detected by color flow Doppler.  LEFT VENTRICLE PLAX 2D LVIDd:         4.55 cm   Diastology LVIDs:         3.38 cm   LV e' medial:    9.79 cm/s LV PW:         1.27 cm   LV E/e' medial:  10.3 LV IVS:        0.81 cm   LV e' lateral:   7.29 cm/s LVOT diam:     1.90 cm   LV E/e' lateral: 13.9 LV SV:         55 LV SV Index:   23 LVOT Area:     2.84 cm  RIGHT VENTRICLE RV Basal diam:  4.94 cm RV Mid diam:    5.31 cm LEFT ATRIUM             Index        RIGHT ATRIUM           Index LA diam:        4.30 cm 1.83 cm/m   RA Area:     23.70 cm LA Vol (A2C):   72.9 ml 31.02 ml/m  RA Volume:   78.10 ml  33.23 ml/m LA Vol (A4C):   65.1 ml 27.70 ml/m LA Biplane Vol: 70.7 ml 30.08 ml/m  AORTIC VALVE                     PULMONIC VALVE AV Area (Vmax):    1.47 cm      PV Vmax:       0.81 m/s AV Area (Vmean):   1.38 cm      PV Vmean:      60.700 cm/s AV Area (VTI):     1.55 cm      PV VTI:        0.176 m AV Vmax:           177.00 cm/s   PV Peak grad:  2.6 mmHg AV Vmean:          130.000 cm/s  PV Mean grad:  2.0 mmHg AV VTI:            0.355 m AV Peak Grad:      12.5 mmHg AV Mean Grad:      8.0 mmHg LVOT Vmax:         92.00 cm/s LVOT Vmean:        63.400 cm/s LVOT VTI:          0.194 m LVOT/AV VTI ratio: 0.55  AORTA Ao Root diam: 2.80 cm MITRAL VALVE MV Area (PHT): 2.48 cm     SHUNTS MV Area VTI:   1.41 cm     Systemic VTI:  0.19 m MV Peak grad:  6.0 mmHg     Systemic Diam: 1.90 cm MV Mean grad:  2.0 mmHg MV Vmax:       1.22 m/s MV Vmean:      69.2 cm/s MV Decel Time: 306 msec MV E velocity: 101.00 cm/s MV A velocity: 115.00 cm/s MV E/A ratio:  0.88 Serafina Royals MD Electronically signed by Serafina Royals MD Signature Date/Time: 09/20/2021/12:07:16 PM    Final                LOS: 1 day   Kennadee Walthour  [facility]   Pager on [email or web address]. If 7PM-7AM, please contact night-coverage at [email or web address]     09/20/2021, 12:23 PM

## 2021-09-20 NOTE — Consult Note (Signed)
ANTICOAGULATION CONSULT NOTE - Initial Consult  Pharmacy Consult for Heparin Indication:  NSTEMI  Allergies  Allergen Reactions   Nsaids Other (See Comments)    CKD   Penicillins Other (See Comments)    Not sure of reaction     Patient Measurements: Height: 5\' 5"  (165.1 cm) Weight: 136.1 kg (300 lb) IBW/kg (Calculated) : 57 Heparin Dosing Weight: 90.7 kg  Vital Signs: BP: 116/52 (12/07 0730) Pulse Rate: 71 (12/07 0730)  Labs: Recent Labs    09/19/21 1859 09/19/21 2043 09/20/21 0451  HGB 10.6*  --  9.7*  HCT 34.9*  --  31.5*  PLT 145*  --  143*  APTT 29  --   --   LABPROT 15.9*  --   --   INR 1.3*  --   --   HEPARINUNFRC  --   --  0.10*  CREATININE 4.68*  --  4.41*  CKTOTAL 487*  --   --   TROPONINIHS 903* 832*  --      Estimated Creatinine Clearance: 15.9 mL/min (A) (by C-G formula based on SCr of 4.41 mg/dL (H)).   Medical History: Past Medical History:  Diagnosis Date   Arthritis    Cataract    CHF (congestive heart failure) (HCC)    CKD (chronic kidney disease) stage 2, GFR 60-89 ml/min    Diabetes mellitus without complication ([place])    Hyperlipidemia    Hypertension    Morbid obesity ([place])    Osteoporosis 12/13/2013   Proteinuria 10/15/2013   referred to Nephrology    Medications:  (Not in a hospital admission) Scheduled:   amLODipine  10 mg Oral Daily   atorvastatin  20 mg Oral QHS   carvedilol  12.5 mg Oral BID WC   cholecalciferol  1,000 Units Oral Daily   furosemide  60 mg Intravenous BID   glipiZIDE  5 mg Oral Q breakfast   insulin aspart  0-9 Units Subcutaneous TID PC & HS   multivitamin with minerals  1 tablet Oral Daily   omega-3 acid ethyl esters  1 g Oral Daily   Infusions:   azithromycin Stopped (09/19/21 2213)   cefTRIAXone (ROCEPHIN)  IV Stopped (09/19/21 2122)   heparin 1,200 Units/hr (09/20/21 0530)    Assessment: Pharmacy has been consulted to initiate heparin infusion on 75yo patient with shortness of breath.  Patient's troponin level was 903. No history of chronic anticoagulant use. Baseline labs: aPTT 29 sec, INR 1.3, Hgb 11.6, Plts 145  Goal of Therapy:  Heparin level 0.3-0.7 units/ml Monitor platelets by anticoagulation protocol: Yes   Plan:  HL subtherapeutic. Give 2700 units bolus and increase heparin infusion to 1500 units/hr Check anti-Xa level in 8 hours and daily while on heparin Continue to monitor H&H and platelets  Fredderick Swanger O Adon Gehlhausen 09/20/2021,8:03 AM

## 2021-09-20 NOTE — ED Notes (Signed)
Patient sleeping in stretcher. NAD of distress. Family at bedside.

## 2021-09-20 NOTE — Consult Note (Signed)
[facility] Consultation Note  Patient ID: Julie [name], MRN: [id number], DOB/AGE: 04-10-1946 75 y.o. Admit date: 09/19/2021   Date of Consult: 09/20/2021 Primary Physician: Gladstone Lighter, MD Primary Cardiologist: Nehemiah Massed  Chief Complaint:  Chief Complaint  Patient presents with  . Shortness of Breath   Reason for Consult:  Shortness of breath heart failure  HPI: 75 y.o. female with known diastolic dysfunction congestive heart failure with previous echocardiogram this year showing normal LV systolic function with no evidence of valvular heart disease as well as a normal stress test.  She does have diabetes hypertension hyperlipidemia and chronic kidney disease stage II which has been medically managed appropriately.  She has had significant apparent low oxygen level for which she has been on oxygen at home and when she had some difficulty using her oxygen she had hypoxia and was not improving.  Apparently she was at home not getting the care she needed and arrived with severe hypoxia shortness of breath and the possibility of congestive heart failure.  CT scan suggested left lower lobe consolidation possibly pneumonia but no evidence of significant edema.  Patient does have 1+ lower extremity edema which is stable.  In addition to that the patient also has a troponin level of 832 and 903 consistent with demand ischemia more than than acute coronary syndrome.  EKG has been unchanged and normal at this time.  She is anemic with a hemoglobin of 9.7 and her glomerular filtration is 10 which is significantly worse than before.  It is unclear the primary etiology of her concerns although currently will need further medical management  Past Medical History:  Diagnosis Date  . Arthritis   . Cataract   . CHF (congestive heart failure) ([place])   . CKD (chronic kidney disease) stage 2, GFR 60-89 ml/min   . Diabetes mellitus without complication ([place])   . Hyperlipidemia   .  Hypertension   . Morbid obesity ([place])   . Osteoporosis 12/13/2013  . Proteinuria 10/15/2013   referred to Nephrology      Surgical History:  Past Surgical History:  Procedure Laterality Date  . COLONOSCOPY WITH PROPOFOL N/A 07/16/2017   Procedure: COLONOSCOPY WITH PROPOFOL;  Surgeon: Robert Bellow, MD;  Location: [facility] ENDOSCOPY;  Service: Endoscopy;  Laterality: N/A;  . DILATION AND CURETTAGE OF UTERUS  2007  . ESOPHAGOGASTRODUODENOSCOPY (EGD) WITH PROPOFOL N/A 07/16/2017   Procedure: ESOPHAGOGASTRODUODENOSCOPY (EGD) WITH PROPOFOL;  Surgeon: Robert Bellow, MD;  Location: [facility] ENDOSCOPY;  Service: Endoscopy;  Laterality: N/A;  . MOUTH SURGERY  2007   cyst  . TONSILLECTOMY       Home Meds: Prior to Admission medications   Medication Sig Start Date End Date Taking? Authorizing Provider  alendronate (FOSAMAX) 70 MG tablet Take 1 tablet (70 mg total) by mouth once a week. 05/13/20  Yes Delsa Grana, PA-C  amLODipine (NORVASC) 10 MG tablet TAKE 1 TABLET BY MOUTH  DAILY 09/12/20  Yes Delsa Grana, PA-C  atorvastatin (LIPITOR) 40 MG tablet Take 0.5 tablets (20 mg total) by mouth at bedtime. 06/20/20  Yes Delsa Grana, PA-C  carvedilol (COREG) 12.5 MG tablet Take 12.5 mg by mouth 2 (two) times daily with a meal. 04/13/21 04/13/22 Yes [provider]  Cholecalciferol 25 MCG (1000 UT) tablet Take 1,000 Units by mouth daily.   Yes [provider]  cyclobenzaprine (FLEXERIL) 5 MG tablet Take 5 mg by mouth 4 (four) times daily as needed. 09/08/21  Yes [provider]  ferrous  sulfate 325 (65 FE) MG tablet Take 1 tablet (325 mg total) by mouth daily. 07/27/21 09/19/21 Yes Wieting, Richard, MD  furosemide (LASIX) 40 MG tablet Take 1 tablet (40 mg total) by mouth 2 (two) times daily. 07/27/21  Yes Wieting, Richard, MD  glipiZIDE (GLUCOTROL XL) 5 MG 24 hr tablet Take 1 tablet (5 mg total) by mouth daily with breakfast. 07/27/21  Yes Wieting, Richard, MD  lisinopril  (ZESTRIL) 40 MG tablet Take 1 tablet (40 mg total) by mouth daily. 05/17/20  Yes Delsa Grana, PA-C  metolazone (ZAROXOLYN) 2.5 MG tablet Take 1 tablet by mouth once a week. 08/02/21  Yes [provider]  Multiple Vitamin (MULTI-VITAMINS) TABS Take by mouth daily.    Yes [provider]  Omega-3 Fatty Acids (FISH OIL) 1000 MG CAPS Take 1,000 mg by mouth 2 (two) times daily.   Yes [provider]  ondansetron (ZOFRAN) 4 MG tablet Take 1 tablet (4 mg total) by mouth every 8 (eight) hours as needed for up to 5 days for nausea or vomiting. 09/16/21 09/21/21 Yes Vallarie Mare M, PA-C  ONETOUCH VERIO test strip 1 each daily. 03/26/20  Yes [provider]    Inpatient Medications:  . amLODipine  10 mg Oral Daily  . carvedilol  12.5 mg Oral BID WC  . cholecalciferol  1,000 Units Oral Daily  . furosemide  60 mg Intravenous BID  . glipiZIDE  5 mg Oral Q breakfast  . insulin aspart  0-9 Units Subcutaneous TID PC & HS  . multivitamin with minerals  1 tablet Oral Daily  . omega-3 acid ethyl esters  1 g Oral Daily   . azithromycin Stopped (09/19/21 2213)  . cefTRIAXone (ROCEPHIN)  IV Stopped (09/19/21 2122)  . heparin 1,500 Units/hr (09/20/21 0831)    Allergies:  Allergies  Allergen Reactions  . Nsaids Other (See Comments)    CKD  . Penicillins Other (See Comments)    Not sure of reaction     Social History   Socioeconomic History  . Marital status: Married    Spouse name: Rosita Fire  . Number of children: 1  . Years of education: some college  . Highest education level: 12th grade  Occupational History  . Occupation: Retired  Tobacco Use  . Smoking status: Never  . Smokeless tobacco: Never  . Tobacco comments:    smoking cessation materials not required  Vaping Use  . Vaping Use: Never used  Substance and Sexual Activity  . Alcohol use: No  . Drug use: No  . Sexual activity: Not Currently  Other Topics Concern  . Not on file  Social History  Narrative  . Not on file   Social Determinants of Health   Financial Resource Strain: Not on file  Food Insecurity: Not on file  Transportation Needs: Not on file  Physical Activity: Not on file  Stress: Not on file  Social Connections: Not on file  Intimate Partner Violence: Not on file     Family History  Problem Relation Age of Onset  . Glaucoma Mother   . Cancer Father        liver  . Cancer Sister        lung, bone  . COPD Sister   . Heart disease Maternal Grandmother        chf  . Hypertension Brother   . Hypertension Maternal Uncle   . Ovarian cancer Paternal Grandmother   . Cancer Paternal Grandmother  ovarian cancer  . Breast cancer Neg Hx      Review of Systems Positive for shortness of breath Negative for: General:  chills, fever, night sweats or weight changes.  Cardiovascular: PND orthopnea syncope dizziness  Dermatological skin lesions rashes Respiratory: Cough congestion Urologic: Frequent urination urination at night and hematuria Abdominal: negative for nausea, vomiting, diarrhea, bright red blood per rectum, melena, or hematemesis Neurologic: negative for visual changes, and/or hearing changes  All other systems reviewed and are otherwise negative except as noted above.  Labs: Recent Labs    09/19/21 1859  CKTOTAL 487*   Lab Results  Component Value Date   WBC 9.8 09/20/2021   HGB 9.7 (L) 09/20/2021   HCT 31.5 (L) 09/20/2021   MCV 81.4 09/20/2021   PLT 143 (L) 09/20/2021    Recent Labs  Lab 09/20/21 0451  NA 134*  K 5.1  CL 100  CO2 24  BUN 82*  CREATININE 4.41*  CALCIUM 8.4*  PROT 5.9*  BILITOT 0.9  ALKPHOS 65  ALT 1,046*  AST 485*  GLUCOSE 143*   Lab Results  Component Value Date   CHOL 135 11/10/2019   HDL 42 (L) 11/10/2019   LDLCALC 75 11/10/2019   TRIG 98 11/10/2019   No results found for: DDIMER  Radiology/Studies:  CT HEAD WO CONTRAST (5MM)  Result Date: 09/19/2021 CLINICAL DATA:  Altered mental  status, initial encounter EXAM: CT HEAD WITHOUT CONTRAST CT CERVICAL SPINE WITHOUT CONTRAST TECHNIQUE: Multidetector CT imaging of the head and cervical spine was performed following the standard protocol without intravenous contrast. Multiplanar CT image reconstructions of the cervical spine were also generated. COMPARISON:  07/03/2018 FINDINGS: CT HEAD FINDINGS Brain: No evidence of acute infarction, hemorrhage, hydrocephalus, extra-axial collection or mass lesion/mass effect. Vascular: No hyperdense vessel or unexpected calcification. Skull: Normal. Negative for fracture or focal lesion. Sinuses/Orbits: No acute finding. Other: None. CT CERVICAL SPINE FINDINGS Alignment: Loss of normal cervical lordosis is noted likely related to muscular spasm. Skull base and vertebrae: 7 cervical segments are well visualized. Vertebral body height is well maintained. No acute fracture or acute facet abnormality is noted. The odontoid is within normal limits. Soft tissues and spinal canal: Surrounding soft tissue structures show vascular calcifications. No focal hematoma is noted. Upper chest: Visualized lung apices are within normal limits. Other: None IMPRESSION: CT of the head: No acute intracranial abnormality noted. CT of cervical spine: Mild loss of the normal cervical lordosis likely related to muscular spasm. No acute bony abnormality is noted. Electronically Signed   By: Inez Catalina M.D.   On: 09/19/2021 19:50   CT Chest Wo Contrast  Result Date: 09/19/2021 CLINICAL DATA:  Persistent cough. EXAM: CT CHEST WITHOUT CONTRAST TECHNIQUE: Multidetector CT imaging of the chest was performed following the standard protocol without IV contrast. COMPARISON:  Chest radiograph dated 09/19/2021 and CT dated 09/16/2021. FINDINGS: Evaluation of this exam is limited in the absence of intravenous contrast as well as due to respiratory motion. Cardiovascular: Mild cardiomegaly. No pericardial effusion. Advanced 3 vessel coronary  vascular calcification. Mild atherosclerotic calcification of the thoracic aorta. No aneurysmal dilatation. The central pulmonary arteries are grossly unremarkable. Mediastinum/Nodes: No hilar or mediastinal adenopathy. The esophagus is grossly unremarkable. No mediastinal fluid collection. Lungs/Pleura: There is eventration of the left hemidiaphragm. There is partial consolidative changes of the left lower lobe which may represent atelectasis or pneumonia. Right lung base subsegmental densities may represent atelectasis. There is diffuse mosaic attenuation of the lung with areas of  air trapping which may represent a underlying small airways versus small vessel disease. No pleural effusion or pneumothorax. The central airways are patent. Upper Abdomen: Multiple gallstones. Musculoskeletal: Degenerative changes of the spine. No acute osseous pathology the IMPRESSION: 1. Partial consolidative changes of the left lower lobe may represent atelectasis or pneumonia. 2. Mosaic attenuation of the lung parenchyma with areas of air trapping likely representing underlying small airways versus small vessel disease. 3. Cholelithiasis. 4. Aortic Atherosclerosis (ICD10-I70.0). Electronically Signed   By: Anner Crete M.D.   On: 09/19/2021 19:57   CT CHEST WO CONTRAST  Result Date: 09/16/2021 CLINICAL DATA:  Back pain following coughing,, history of recent fall, initial encounter initial encounter EXAM: CT CHEST WITHOUT CONTRAST TECHNIQUE: Multidetector CT imaging of the chest was performed following the standard protocol without IV contrast. COMPARISON:  07/24/2021 FINDINGS: Cardiovascular: Limited due to lack of IV contrast. Atherosclerotic calcifications of the aorta are noted without aneurysmal dilatation. The heart is at the upper limits of normal in size. Coronary calcifications are seen. No enlargement of pulmonary artery is noted. Mediastinum/Nodes: Thoracic inlet is within normal limits. Scattered small mediastinal  lymph nodes are noted stable from a prior CT from 07/07/2021. These are likely reactive in nature but not significant by size criteria. The esophagus as visualized is within normal limits. Lungs/Pleura: Bibasilar atelectatic changes are noted. No sizable effusion is seen. Some mosaic attenuation is noted throughout both lungs stable in appearance from the prior exam. No focal nodule is seen. No confluent infiltrate is noted. Upper Abdomen: Multiple gallstones are noted in the right upper quadrant. Remainder of the upper abdomen appears within normal limits. Musculoskeletal: No acute rib abnormality is noted. No acute compression deformity is seen. No sternal fracture is noted. IMPRESSION: Cholelithiasis without complicating factors. No compression deformity or rib fracture is noted. Stable changes of mosaic attenuation within the lungs likely representing some air trapping. No focal confluent infiltrate is seen. Mild bibasilar atelectasis is noted. Aortic Atherosclerosis (ICD10-I70.0). Electronically Signed   By: Inez Catalina M.D.   On: 09/16/2021 19:18   CT Cervical Spine Wo Contrast  Result Date: 09/19/2021 CLINICAL DATA:  Altered mental status, initial encounter EXAM: CT HEAD WITHOUT CONTRAST CT CERVICAL SPINE WITHOUT CONTRAST TECHNIQUE: Multidetector CT imaging of the head and cervical spine was performed following the standard protocol without intravenous contrast. Multiplanar CT image reconstructions of the cervical spine were also generated. COMPARISON:  07/03/2018 FINDINGS: CT HEAD FINDINGS Brain: No evidence of acute infarction, hemorrhage, hydrocephalus, extra-axial collection or mass lesion/mass effect. Vascular: No hyperdense vessel or unexpected calcification. Skull: Normal. Negative for fracture or focal lesion. Sinuses/Orbits: No acute finding. Other: None. CT CERVICAL SPINE FINDINGS Alignment: Loss of normal cervical lordosis is noted likely related to muscular spasm. Skull base and vertebrae: 7  cervical segments are well visualized. Vertebral body height is well maintained. No acute fracture or acute facet abnormality is noted. The odontoid is within normal limits. Soft tissues and spinal canal: Surrounding soft tissue structures show vascular calcifications. No focal hematoma is noted. Upper chest: Visualized lung apices are within normal limits. Other: None IMPRESSION: CT of the head: No acute intracranial abnormality noted. CT of cervical spine: Mild loss of the normal cervical lordosis likely related to muscular spasm. No acute bony abnormality is noted. Electronically Signed   By: Inez Catalina M.D.   On: 09/19/2021 19:50   DG Chest Portable 1 View  Result Date: 09/19/2021 CLINICAL DATA:  Shortness of breath EXAM: PORTABLE CHEST 1 VIEW  COMPARISON:  07/24/2021 FINDINGS: Low lung volumes. Cardiomegaly with central bronchovascular crowding. Bilateral bronchitic changes without focal opacity, pleural effusion or pneumothorax. IMPRESSION: Cardiomegaly with low lung volumes causing central bronchovascular crowding. Bronchitic changes without definitive focal pulmonary opacity. Electronically Signed   By: Donavan Foil M.D.   On: 09/19/2021 19:06    EKG: Normal sinus rhythm otherwise normal EKG  Weights: Filed Weights   09/19/21 1849  Weight: 136.1 kg     Physical Exam: Blood pressure (!) 100/58, pulse (!) 58, temperature 97.7 F (36.5 C), temperature source Oral, resp. rate (!) 23, height 5\' 5"  (1.651 m), weight 136.1 kg, SpO2 92 %. Body mass index is 49.92 kg/m. General: Well developed, well nourished, in no acute distress. Head eyes ears nose throat: Normocephalic, atraumatic, sclera non-icteric, no xanthomas, nares are without discharge. No apparent thyromegaly and/or mass  Lungs: Normal respiratory effort.  no wheezes, bibasilar rales, some 1+ rhonchi.  Heart: RRR with normal S1 S2. no murmur gallop, no rub, PMI is normal size and placement, carotid upstroke normal without bruit,  jugular venous pressure is normal Abdomen: Soft, non-tender, non-distended with normoactive bowel sounds. No hepatomegaly. No rebound/guarding. No obvious abdominal masses. Abdominal aorta is normal size without bruit Extremities: 1+ edema. no cyanosis, no clubbing, no ulcers  Peripheral : 2+ bilateral upper extremity pulses, 2+ bilateral femoral pulses, 2+ bilateral dorsal pedal pulse Neuro: Alert and oriented. No facial asymmetry. No focal deficit. Moves all extremities spontaneously. Musculoskeletal: Normal muscle tone without kyphosis Psych:  Responds to questions appropriately with a normal affect.    Assessment: 75 year old female with acute on chronic diastolic dysfunction congestive heart failure elevated troponin consistent with demand ischemia rather than acute coronary syndrome anemia and acute renal disease with left lower lobe consolidation possibly pneumonia and no current evidence of new EKG changes.  Plan: 1.  Continue supportive care possible left lower lobe consolidation pneumonia and/or hypoxia 2.  Further consideration of gentle hydration with oxygenation for acute kidney injury 3.  Continue oxygenation for hypoxia likely contributing to some of her current concerns 4.  Echocardiogram for reevaluation of LV dysfunction and because of potential for congestive heart failure 5.  No interventional cardiac diagnostics necessary at this time due to no evidence of acute coronary syndrome and concerns of acute kidney injury 6.  Further treatment options after above  Signed, Corey Skains M.D. [facility] 09/20/2021, 11:07 AM

## 2021-09-20 NOTE — TOC Initial Note (Signed)
Transition of Care [facility]) - Initial/Assessment Note    Patient Details  Name: Julie [name] MRN: [id number] Date of Birth: 10-09-1946  Transition of [facility]) CM/SW Contact:    Shelbie Hutching, RN Phone Number: 09/20/2021, 10:15 AM  Clinical Narrative:                 Patient admitted to the hospital with NSTEMI.  RNCM met with patient and her husband at the bedside and then spoke with her grandson, Harrell Gave, right outside the room.  Patient is from home with her husband who has dementia.  She is independent at baseline and walks with a cane.  She has oxygen through [employer] at 2L since October.  Harrell Gave reports that he is the patient's and her husbands POA.  He has been talking with them about moving in with him so he can better look after them.  He does not want either the patient or husband to go to a nursing home.  He would agree to some short term rehab with the goal to get back home as soon as possible.   Grandson would like home health services and his plan is that when patient is discharged she and her husband will be going to live with him.     TOC will cont to follow and assist with disposition.   Expected Discharge Plan: [place] Barriers to Discharge: Continued Medical Work up   Patient Goals and CMS Choice Patient states their goals for this hospitalization and ongoing recovery are:: Patient wants to go home      Expected Discharge Plan and Services Expected Discharge Plan: [place]   Discharge Planning Services: CM Consult   Living arrangements for the past 2 months: Single Family Home                 DME Arranged: N/A DME Agency: NA                  Prior Living Arrangements/Services Living arrangements for the past 2 months: Single Family Home Lives with:: Spouse Patient language and need for interpreter reviewed:: Yes Do you feel safe going back to the place where you live?: Yes      Need for Family  Participation in Patient Care: Yes (Comment) Care giver support system in place?: Yes (comment) (grandson) Current home services: DME (oxygen and cane) Criminal Activity/Legal Involvement Pertinent to Current Situation/Hospitalization: No - Comment as needed  Activities of Daily Living      Permission Sought/Granted Permission sought to share information with : Case Manager, Family Supports Permission granted to share information with : Yes, Verbal Permission Granted  Share Information with NAME: Harrell Gave [place]     Permission granted to share info w Relationship: grandson  Permission granted to share info w Contact Information: [phone]  Emotional Assessment Appearance:: Appears stated age Attitude/Demeanor/Rapport: Lethargic Affect (typically observed): Accepting Orientation: : Oriented to Self, Oriented to Place, Oriented to Situation Alcohol / Substance Use: Not Applicable Psych Involvement: No (comment)  Admission diagnosis:  NSTEMI (non-ST elevated myocardial infarction) [facility]) [I21.4] Patient Active Problem List   Diagnosis Date Noted   NSTEMI (non-ST elevated myocardial infarction) ([place]) 09/19/2021   Acute on chronic diastolic CHF (congestive heart failure) (HCC)    Anemia    Acute exacerbation of CHF (congestive heart failure) ([place]) 07/25/2021   Shortness of breath 07/24/2021   Acute kidney injury superimposed on CKD ([place]) 07/24/2021   Acute on chronic  respiratory failure with hypoxia ([place]) 07/24/2021   Mixed hyperlipidemia 09/29/2019   Benign essential hypertension 09/29/2019   Bilateral lower extremity edema 07/08/2019   Essential hypertension 07/29/2018   Type 2 diabetes mellitus with hyperlipidemia ([place]) 07/29/2018   Age-related osteoporosis without current pathological fracture 07/29/2018   Hyperlipidemia 07/29/2018   Stool guaiac positive 06/03/2017   Dermatitis 01/04/2017   Low back pain 06/06/2016   Medication monitoring encounter 11/30/2015    Varicose vein of leg 11/30/2015   Abnormal mammogram 08/04/2015   Cardiac murmur 07/28/2015   CKD (chronic kidney disease) stage 2, GFR 60-89 ml/min    Morbid obesity ([place])    Osteoporosis    Proteinuria 10/15/2013   PCP:  Gladstone Lighter, MD Pharmacy:   [facility] ([place], [place] [facility] [address] [place] [address] [place] [address] Phone: [phone] Fax: [place] [address]), [place] [address] Phone: [phone] Fax: [place] [facility] ([facility] ) - [place], [place] [address] Phone: [phone] Fax: [phone]     Social Determinants of Health (SDOH) Interventions    Readmission Risk Interventions Readmission Risk Prevention Plan 09/20/2021  Transportation Screening Complete  PCP or Specialist Appt within 3-5 Days Complete  HRI or [place] Complete  Social Work Consult for [place] Planning/Counseling Complete  Palliative Care Screening Not Applicable  Medication Review Press photographer) Complete  Some recent data might be hidden

## 2021-09-20 NOTE — Progress Notes (Signed)
*  PRELIMINARY RESULTS* Echocardiogram 2D Echocardiogram has been performed.  Julie [name] 09/20/2021, 10:40 AM

## 2021-09-20 NOTE — Progress Notes (Signed)
[facility]  ROUNDING NOTE   Subjective:   Julie [name] is a 75 year old female with past medical history of chronic diastolic heart failure, chronic respiratory failure on 2 L oxygen at home, diabetes, hypertension, dyslipidemia, osteoporosis, and CKD stage IIIb.  Patient presents to the emergency room with altered mental status and worsening shortness of breath.  She will be admitted for NSTEMI (non-ST elevated myocardial infarction) [facility]) [I21.4]  Patient is known to our clinic and sees Dr. Zollie Scale.  Last appointment with Dr. Holley Raring was on 08/15/2021.  Patient seen lying in bed, son at bedside.  States approximately 2 days ago she suffered a fall going up the steps.  States she lost her balance and fell face forward.  Son, who she lives with, says her health went down from there.  She currently denies loss of appetite, nausea, vomiting, and diarrhea and constipation.  Currently on 5 L nasal cannula, baseline home requirement of 2 L.  Denies pain and discomfort.  Denies recent history of fever and chills.  Labs on arrival include sodium 134, potassium 5.7, glucose 182, BUN 77, creatinine 4.68 with GFR 9, troponin 903 with CK4 87 CT chest shows possible pneumonia.  CT head negative for acute changes.Marland Kitchen  Respiratory panel negative.  Echo completed today shows no heart failure.  We have been consulted to evaluate acute kidney injury.    Objective:  Vital signs in last 24 hours:  Temp:  [97.7 F (36.5 C)] 97.7 F (36.5 C) (12/06 1844) Pulse Rate:  [58-83] 66 (12/07 1300) Resp:  [17-26] 20 (12/07 1300) BP: (97-141)/(41-84) 124/48 (12/07 1300) SpO2:  [90 %-100 %] 93 % (12/07 1300) Weight:  [136.1 kg] 136.1 kg (12/06 1849)  Weight change:  Filed Weights   09/19/21 1849  Weight: 136.1 kg    Intake/Output: I/O last 3 completed shifts: In: 315.9 [IV Piggyback:315.9] Out: -    Intake/Output this shift:  No intake/output data recorded.  Physical Exam: General: NAD,  resting in bed  Head: Normocephalic, atraumatic. Moist oral mucosal membranes  Eyes: Anicteric  Lungs:  Crackles and wheeze throughout, normal effort, O2 5 L  Heart: Regular rate and rhythm  Abdomen:  Soft, nontender, nondistended  Extremities:  ++ peripheral edema.  Neurologic: Nonfocal, moving all four extremities  Skin: No lesions       Basic Metabolic Panel: Recent Labs  Lab 09/16/21 1737 09/19/21 1859 09/20/21 0451  NA 137 134* 134*  K 4.2 5.7* 5.1  CL 100 97* 100  CO2 29 24 24   GLUCOSE 164* 182* 143*  BUN 42* 77* 82*  CREATININE 1.86* 4.68* 4.41*  CALCIUM 8.8* 8.8* 8.4*    Liver Function Tests: Recent Labs  Lab 09/16/21 1737 09/19/21 1859 09/20/21 0451  AST 18 874* 485*  ALT 19 1,399* 1,046*  ALKPHOS 69 77 65  BILITOT 0.9 1.1 0.9  PROT 6.7 6.7 5.9*  ALBUMIN 3.6 3.6 3.1*   No results for input(s): LIPASE, AMYLASE in the last 168 hours. No results for input(s): AMMONIA in the last 168 hours.  CBC: Recent Labs  Lab 09/16/21 1737 09/19/21 1859 09/20/21 0451  WBC 5.6 11.1* 9.8  NEUTROABS 4.4 9.7*  --   HGB 10.2* 10.6* 9.7*  HCT 33.9* 34.9* 31.5*  MCV 82.5 82.1 81.4  PLT 170 145* 143*    Cardiac Enzymes: Recent Labs  Lab 09/19/21 1859  CKTOTAL 487*    BNP: Invalid input(s): POCBNP  CBG: Recent Labs  Lab 09/19/21 1906 09/20/21 0800 09/20/21 1131  GLUCAP 174* 140* 120*    Microbiology: Results for orders placed or performed during the hospital encounter of 09/19/21  Resp Panel by RT-PCR (Flu A&B, Covid) Nasopharyngeal Swab     Status: None   Collection Time: 09/19/21  6:59 PM   Specimen: Nasopharyngeal Swab; Nasopharyngeal(NP) swabs in vial transport medium  Result Value Ref Range Status   SARS Coronavirus 2 by RT PCR NEGATIVE NEGATIVE Final    Comment: (NOTE) SARS-CoV-2 target nucleic acids are NOT DETECTED.  The SARS-CoV-2 RNA is generally detectable in upper respiratory specimens during the acute phase of infection. The  lowest concentration of SARS-CoV-2 viral copies this assay can detect is 138 copies/mL. A negative result does not preclude SARS-Cov-2 infection and should not be used as the sole basis for treatment or other patient management decisions. A negative result may occur with  improper specimen collection/handling, submission of specimen other than nasopharyngeal swab, presence of viral mutation(s) within the areas targeted by this assay, and inadequate number of viral copies(<138 copies/mL). A negative result must be combined with clinical observations, patient history, and epidemiological information. The expected result is Negative.  Fact Sheet for Patients:  EntrepreneurPulse.com.au  Fact Sheet for Healthcare Providers:  IncredibleEmployment.be  This test is no t yet approved or cleared by the [place] FDA and  has been authorized for detection and/or diagnosis of SARS-CoV-2 by FDA under an Emergency Use Authorization (EUA). This EUA will remain  in effect (meaning this test can be used) for the duration of the COVID-19 declaration under Section 564(b)(1) of the Act, 21 U.S.C.section 360bbb-3(b)(1), unless the authorization is terminated  or revoked sooner.       Influenza A by PCR NEGATIVE NEGATIVE Final   Influenza B by PCR NEGATIVE NEGATIVE Final    Comment: (NOTE) The Xpert Xpress SARS-CoV-2/FLU/RSV plus assay is intended as an aid in the diagnosis of influenza from Nasopharyngeal swab specimens and should not be used as a sole basis for treatment. Nasal washings and aspirates are unacceptable for Xpert Xpress SARS-CoV-2/FLU/RSV testing.  Fact Sheet for Patients: EntrepreneurPulse.com.au  Fact Sheet for Healthcare Providers: IncredibleEmployment.be  This test is not yet approved or cleared by the [place] FDA and has been authorized for detection and/or diagnosis of SARS-CoV-2 by FDA under  an Emergency Use Authorization (EUA). This EUA will remain in effect (meaning this test can be used) for the duration of the COVID-19 declaration under Section 564(b)(1) of the Act, 21 U.S.C. section 360bbb-3(b)(1), unless the authorization is terminated or revoked.  Performed at [facility], [address] [place], [place] [address]   Blood culture (routine x 2)     Status: None (Preliminary result)   Collection Time: 09/19/21  8:43 PM   Specimen: BLOOD  Result Value Ref Range Status   Specimen Description BLOOD RAC  Final   Special Requests   Final    BOTTLES DRAWN AEROBIC AND ANAEROBIC Blood Culture adequate volume   Culture   Final    NO GROWTH < 12 HOURS Performed at [facility], [address] [place], [place] [address]    Report Status PENDING  Incomplete  Blood culture (routine x 2)     Status: None (Preliminary result)   Collection Time: 09/19/21  8:43 PM   Specimen: BLOOD  Result Value Ref Range Status   Specimen Description BLOOD LAC  Final   Special Requests   Final    BOTTLES DRAWN AEROBIC AND ANAEROBIC Blood Culture adequate volume  Culture  Setup Time   Final    Organism ID to follow GRAM POSITIVE COCCI AEROBIC BOTTLE ONLY Performed at [facility], [address] [place], [place] [address]    Culture [facility] POSITIVE COCCI  Final   Report Status PENDING  Incomplete    Coagulation Studies: Recent Labs    09/19/21 1859  LABPROT 15.9*  INR 1.3*    Urinalysis: Recent Labs    09/19/21 2245  COLORURINE YELLOW  LABSPEC >1.030*  PHURINE 5.0  GLUCOSEU NEGATIVE  HGBUR NEGATIVE  BILIRUBINUR MODERATE*  KETONESUR TRACE*  PROTEINUR 100*  NITRITE NEGATIVE  LEUKOCYTESUR NEGATIVE      Imaging: CT HEAD WO CONTRAST (5MM)  Result Date: 09/19/2021 CLINICAL DATA:  Altered mental status, initial encounter EXAM: CT HEAD WITHOUT CONTRAST CT CERVICAL SPINE WITHOUT CONTRAST TECHNIQUE: Multidetector CT imaging of the head and  cervical spine was performed following the standard protocol without intravenous contrast. Multiplanar CT image reconstructions of the cervical spine were also generated. COMPARISON:  07/03/2018 FINDINGS: CT HEAD FINDINGS Brain: No evidence of acute infarction, hemorrhage, hydrocephalus, extra-axial collection or mass lesion/mass effect. Vascular: No hyperdense vessel or unexpected calcification. Skull: Normal. Negative for fracture or focal lesion. Sinuses/Orbits: No acute finding. Other: None. CT CERVICAL SPINE FINDINGS Alignment: Loss of normal cervical lordosis is noted likely related to muscular spasm. Skull base and vertebrae: 7 cervical segments are well visualized. Vertebral body height is well maintained. No acute fracture or acute facet abnormality is noted. The odontoid is within normal limits. Soft tissues and spinal canal: Surrounding soft tissue structures show vascular calcifications. No focal hematoma is noted. Upper chest: Visualized lung apices are within normal limits. Other: None IMPRESSION: CT of the head: No acute intracranial abnormality noted. CT of cervical spine: Mild loss of the normal cervical lordosis likely related to muscular spasm. No acute bony abnormality is noted. Electronically Signed   By: Inez Catalina M.D.   On: 09/19/2021 19:50   CT Chest Wo Contrast  Result Date: 09/19/2021 CLINICAL DATA:  Persistent cough. EXAM: CT CHEST WITHOUT CONTRAST TECHNIQUE: Multidetector CT imaging of the chest was performed following the standard protocol without IV contrast. COMPARISON:  Chest radiograph dated 09/19/2021 and CT dated 09/16/2021. FINDINGS: Evaluation of this exam is limited in the absence of intravenous contrast as well as due to respiratory motion. Cardiovascular: Mild cardiomegaly. No pericardial effusion. Advanced 3 vessel coronary vascular calcification. Mild atherosclerotic calcification of the thoracic aorta. No aneurysmal dilatation. The central pulmonary arteries are  grossly unremarkable. Mediastinum/Nodes: No hilar or mediastinal adenopathy. The esophagus is grossly unremarkable. No mediastinal fluid collection. Lungs/Pleura: There is eventration of the left hemidiaphragm. There is partial consolidative changes of the left lower lobe which may represent atelectasis or pneumonia. Right lung base subsegmental densities may represent atelectasis. There is diffuse mosaic attenuation of the lung with areas of air trapping which may represent a underlying small airways versus small vessel disease. No pleural effusion or pneumothorax. The central airways are patent. Upper Abdomen: Multiple gallstones. Musculoskeletal: Degenerative changes of the spine. No acute osseous pathology the IMPRESSION: 1. Partial consolidative changes of the left lower lobe may represent atelectasis or pneumonia. 2. Mosaic attenuation of the lung parenchyma with areas of air trapping likely representing underlying small airways versus small vessel disease. 3. Cholelithiasis. 4. Aortic Atherosclerosis (ICD10-I70.0). Electronically Signed   By: Anner Crete M.D.   On: 09/19/2021 19:57   CT Cervical Spine Wo Contrast  Result Date: 09/19/2021 CLINICAL DATA:  Altered mental status,  initial encounter EXAM: CT HEAD WITHOUT CONTRAST CT CERVICAL SPINE WITHOUT CONTRAST TECHNIQUE: Multidetector CT imaging of the head and cervical spine was performed following the standard protocol without intravenous contrast. Multiplanar CT image reconstructions of the cervical spine were also generated. COMPARISON:  07/03/2018 FINDINGS: CT HEAD FINDINGS Brain: No evidence of acute infarction, hemorrhage, hydrocephalus, extra-axial collection or mass lesion/mass effect. Vascular: No hyperdense vessel or unexpected calcification. Skull: Normal. Negative for fracture or focal lesion. Sinuses/Orbits: No acute finding. Other: None. CT CERVICAL SPINE FINDINGS Alignment: Loss of normal cervical lordosis is noted likely related to  muscular spasm. Skull base and vertebrae: 7 cervical segments are well visualized. Vertebral body height is well maintained. No acute fracture or acute facet abnormality is noted. The odontoid is within normal limits. Soft tissues and spinal canal: Surrounding soft tissue structures show vascular calcifications. No focal hematoma is noted. Upper chest: Visualized lung apices are within normal limits. Other: None IMPRESSION: CT of the head: No acute intracranial abnormality noted. CT of cervical spine: Mild loss of the normal cervical lordosis likely related to muscular spasm. No acute bony abnormality is noted. Electronically Signed   By: Inez Catalina M.D.   On: 09/19/2021 19:50   DG Chest Portable 1 View  Result Date: 09/19/2021 CLINICAL DATA:  Shortness of breath EXAM: PORTABLE CHEST 1 VIEW COMPARISON:  07/24/2021 FINDINGS: Low lung volumes. Cardiomegaly with central bronchovascular crowding. Bilateral bronchitic changes without focal opacity, pleural effusion or pneumothorax. IMPRESSION: Cardiomegaly with low lung volumes causing central bronchovascular crowding. Bronchitic changes without definitive focal pulmonary opacity. Electronically Signed   By: Donavan Foil M.D.   On: 09/19/2021 19:06   ECHOCARDIOGRAM COMPLETE  Result Date: 09/20/2021    ECHOCARDIOGRAM REPORT   Patient Name:   Julie [name] Date of Exam: 09/20/2021 Medical Rec #:  [id number]         Height:       65.0 in Accession #:    [id number]        Weight:       300.0 lb Date of Birth:  10-24-1945         BSA:          2.350 m Patient Age:    82 years          BP:           116/52 mmHg Patient Gender: F                 HR:           64 bpm. Exam Location:  [facility] Procedure: 2D Echo, Color Doppler, Cardiac Doppler and Intracardiac            Opacification Agent Indications:     I21.4 NSTEMI  History:         Patient has no prior history of Echocardiogram examinations.                  CHF, CKD; Risk Factors:Hypertension, Dyslipidemia and  Diabetes.  Sonographer:     Charmayne Sheer Referring Phys:  [id number] [place] Diagnosing Phys: Serafina Royals MD  Sonographer Comments: Technically difficult study due to poor echo windows. Image acquisition challenging due to patient body habitus. IMPRESSIONS  1. Left ventricular ejection fraction, by estimation, is 65 to 70%. The left ventricle has normal function. The left ventricle has no regional wall motion abnormalities. Left ventricular diastolic parameters were normal.  2. Right ventricular systolic function is normal. The right ventricular size is  normal.  3. The mitral valve is normal in structure. Mild mitral valve regurgitation.  4. The aortic valve is normal in structure. Aortic valve regurgitation is not visualized. FINDINGS  Left Ventricle: Left ventricular ejection fraction, by estimation, is 65 to 70%. The left ventricle has normal function. The left ventricle has no regional wall motion abnormalities. Definity contrast agent was given IV to delineate the left ventricular  endocardial borders. The left ventricular internal cavity size was small. There is no left ventricular hypertrophy. Left ventricular diastolic parameters were normal. Right Ventricle: The right ventricular size is normal. No increase in right ventricular wall thickness. Right ventricular systolic function is normal. Left Atrium: Left atrial size was normal in size. Right Atrium: Right atrial size was normal in size. Pericardium: There is no evidence of pericardial effusion. Mitral Valve: The mitral valve is normal in structure. Mild mitral valve regurgitation. MV peak gradient, 6.0 mmHg. The mean mitral valve gradient is 2.0 mmHg. Tricuspid Valve: The tricuspid valve is normal in structure. Tricuspid valve regurgitation is trivial. Aortic Valve: The aortic valve is normal in structure. Aortic valve regurgitation is not visualized. Aortic valve mean gradient measures 8.0 mmHg. Aortic valve peak gradient measures 12.5 mmHg. Aortic  valve area, by VTI measures 1.55 cm. Pulmonic Valve: The pulmonic valve was normal in structure. Pulmonic valve regurgitation is not visualized. Aorta: The aortic root and ascending aorta are structurally normal, with no evidence of dilitation. IAS/Shunts: No atrial level shunt detected by color flow Doppler.  LEFT VENTRICLE PLAX 2D LVIDd:         4.55 cm   Diastology LVIDs:         3.38 cm   LV e' medial:    9.79 cm/s LV PW:         1.27 cm   LV E/e' medial:  10.3 LV IVS:        0.81 cm   LV e' lateral:   7.29 cm/s LVOT diam:     1.90 cm   LV E/e' lateral: 13.9 LV SV:         55 LV SV Index:   23 LVOT Area:     2.84 cm  RIGHT VENTRICLE RV Basal diam:  4.94 cm RV Mid diam:    5.31 cm LEFT ATRIUM             Index        RIGHT ATRIUM           Index LA diam:        4.30 cm 1.83 cm/m   RA Area:     23.70 cm LA Vol (A2C):   72.9 ml 31.02 ml/m  RA Volume:   78.10 ml  33.23 ml/m LA Vol (A4C):   65.1 ml 27.70 ml/m LA Biplane Vol: 70.7 ml 30.08 ml/m  AORTIC VALVE                     PULMONIC VALVE AV Area (Vmax):    1.47 cm      PV Vmax:       0.81 m/s AV Area (Vmean):   1.38 cm      PV Vmean:      60.700 cm/s AV Area (VTI):     1.55 cm      PV VTI:        0.176 m AV Vmax:           177.00 cm/s   PV Peak grad:  2.6  mmHg AV Vmean:          130.000 cm/s  PV Mean grad:  2.0 mmHg AV VTI:            0.355 m AV Peak Grad:      12.5 mmHg AV Mean Grad:      8.0 mmHg LVOT Vmax:         92.00 cm/s LVOT Vmean:        63.400 cm/s LVOT VTI:          0.194 m LVOT/AV VTI ratio: 0.55  AORTA Ao Root diam: 2.80 cm MITRAL VALVE MV Area (PHT): 2.48 cm     SHUNTS MV Area VTI:   1.41 cm     Systemic VTI:  0.19 m MV Peak grad:  6.0 mmHg     Systemic Diam: 1.90 cm MV Mean grad:  2.0 mmHg MV Vmax:       1.22 m/s MV Vmean:      69.2 cm/s MV Decel Time: 306 msec MV E velocity: 101.00 cm/s MV A velocity: 115.00 cm/s MV E/A ratio:  0.88 Serafina Royals MD Electronically signed by Serafina Royals MD Signature Date/Time: 09/20/2021/12:07:16 PM     Final      Medications:    azithromycin Stopped (09/19/21 2213)   cefTRIAXone (ROCEPHIN)  IV Stopped (09/19/21 2122)   heparin 1,500 Units/hr (09/20/21 0831)    amLODipine  10 mg Oral Daily   carvedilol  12.5 mg Oral BID WC   cholecalciferol  1,000 Units Oral Daily   furosemide  60 mg Intravenous BID   insulin aspart  0-9 Units Subcutaneous TID PC & HS   multivitamin with minerals  1 tablet Oral Daily   omega-3 acid ethyl esters  1 g Oral Daily   acetaminophen **OR** acetaminophen, magnesium hydroxide, morphine injection, nitroGLYCERIN, ondansetron, traZODone  Assessment/ Plan:  Julie [name] is a 75 y.o.  female with past medical history of chronic diastolic heart failure, chronic respiratory failure on 2 L oxygen at home, diabetes, hypertension, dyslipidemia, osteoporosis, and CKD stage IIIb.  Patient presents to the emergency room with altered mental status and worsening shortness of breath.  She will be admitted for NSTEMI (non-ST elevated myocardial infarction) ([place]) [I21.4]  Acute Kidney Injury on chronic kidney disease stage 3b with baseline creatinine 1.74 and GFR of 31 on 08/15/21.  Acute kidney injury likely secondary to cardiorenal syndrome Lisinopril held.  No IV contrast exposure Renal ultrasound ordered to evaluate obstruction No acute indication of dialysis at this time.  Agree with IV Lasix for fluid removal and improved respiratory status.  Lab Results  Component Value Date   CREATININE 4.41 (H) 09/20/2021   CREATININE 4.68 (H) 09/19/2021   CREATININE 1.86 (H) 09/16/2021    Intake/Output Summary (Last 24 hours) at 09/20/2021 1359 Last data filed at 09/19/2021 2213 Gross per 24 hour  Intake 315.87 ml  Output --  Net 315.87 ml   2. Anemia of chronic kidney disease: Normocytic Lab Results  Component Value Date   HGB 9.7 (L) 09/20/2021    Hemoglobin stable at this time  3. Secondary Hyperparathyroidism: with outpatient labs: PTH 84, phosphorus  4.6, calcium 8.9 on 08/15/21.   Lab Results  Component Value Date   CALCIUM 8.4 (L) 09/20/2021  Continue to monitor bone mineral metabolism during this admission.  Will obtain updated phosphorus with a.m. labs  4.  Hypertension with chronic kidney disease.  Home regimen includes amlodipine, carvedilol, furosemide, and lisinopril.  Lisinopril  currently on hold.  Blood pressure stable at this time  5. Diabetes mellitus type II with chronic kidney disease noninsulin dependent. Home regimen includes glipizide. Most recent hemoglobin A1c is 5.4 on 07/24/21.  Glucose stable      LOS: 1 Bryley Kovacevic 12/7/20221:59 PM

## 2021-09-20 NOTE — Progress Notes (Signed)
PHARMACY - PHYSICIAN COMMUNICATION CRITICAL VALUE ALERT - BLOOD CULTURE IDENTIFICATION (BCID)  Julie [name] is an 76 y.o. female who presented to [facility] on 09/19/2021 with a chief complaint of shortness of breath  Assessment:  12/6 blood culture with GPC in 1 of 4 bottles, BCID detects S. Epidermidis and S lugdunesis,  Methicillin resistance gene was detected.  On antibiotics for CAP  Name of physician (or Provider) Contacted: Dr Mal Misty  Current antibiotics: ceftriaxone/azithromycin  Changes to prescribed antibiotics recommended:  Patient is on recommended antibiotics - No changes needed - suspect blood culture is contaminant  Results for orders placed or performed during the hospital encounter of 09/19/21  Blood Culture ID Panel (Reflexed) (Collected: 09/19/2021  8:43 PM)  Result Value Ref Range   Enterococcus faecalis NOT DETECTED NOT DETECTED   Enterococcus Faecium NOT DETECTED NOT DETECTED   Listeria monocytogenes NOT DETECTED NOT DETECTED   Staphylococcus species DETECTED (A) NOT DETECTED   Staphylococcus aureus (BCID) NOT DETECTED NOT DETECTED   Staphylococcus epidermidis DETECTED (A) NOT DETECTED   Staphylococcus lugdunensis DETECTED (A) NOT DETECTED   Streptococcus species NOT DETECTED NOT DETECTED   Streptococcus agalactiae NOT DETECTED NOT DETECTED   Streptococcus pneumoniae NOT DETECTED NOT DETECTED   Streptococcus pyogenes NOT DETECTED NOT DETECTED   A.calcoaceticus-baumannii NOT DETECTED NOT DETECTED   Bacteroides fragilis NOT DETECTED NOT DETECTED   Enterobacterales NOT DETECTED NOT DETECTED   Enterobacter cloacae complex NOT DETECTED NOT DETECTED   Escherichia coli NOT DETECTED NOT DETECTED   Klebsiella aerogenes NOT DETECTED NOT DETECTED   Klebsiella oxytoca NOT DETECTED NOT DETECTED   Klebsiella pneumoniae NOT DETECTED NOT DETECTED   Proteus species NOT DETECTED NOT DETECTED   Salmonella species NOT DETECTED NOT DETECTED   Serratia marcescens NOT DETECTED  NOT DETECTED   Haemophilus influenzae NOT DETECTED NOT DETECTED   Neisseria meningitidis NOT DETECTED NOT DETECTED   Pseudomonas aeruginosa NOT DETECTED NOT DETECTED   Stenotrophomonas maltophilia NOT DETECTED NOT DETECTED   Candida albicans NOT DETECTED NOT DETECTED   Candida auris NOT DETECTED NOT DETECTED   Candida glabrata NOT DETECTED NOT DETECTED   Candida krusei NOT DETECTED NOT DETECTED   Candida parapsilosis NOT DETECTED NOT DETECTED   Candida tropicalis NOT DETECTED NOT DETECTED   Cryptococcus neoformans/gattii NOT DETECTED NOT DETECTED   Methicillin resistance mecA/C DETECTED (A) NOT DETECTED   Doreene Eland, PharmD, BCPS, BCIDP Work Cell: [phone] 09/20/2021 3:18 PM

## 2021-09-21 LAB — GLUCOSE, CAPILLARY
Glucose-Capillary: 117 mg/dL — ABNORMAL HIGH (ref 70–99)
Glucose-Capillary: 249 mg/dL — ABNORMAL HIGH (ref 70–99)
Glucose-Capillary: 203 mg/dL — ABNORMAL HIGH (ref 70–99)
Glucose-Capillary: 209 mg/dL — ABNORMAL HIGH (ref 70–99)

## 2021-09-21 LAB — CBC
HCT: 29.5 % — ABNORMAL LOW (ref 36.0–46.0)
Hemoglobin: 9 g/dL — ABNORMAL LOW (ref 12.0–15.0)
MCH: 24.1 pg — ABNORMAL LOW (ref 26.0–34.0)
MCHC: 30.5 g/dL (ref 30.0–36.0)
MCV: 79.1 fL — ABNORMAL LOW (ref 80.0–100.0)
Platelets: 128 10*3/uL — ABNORMAL LOW (ref 150–400)
RBC: 3.73 MIL/uL — ABNORMAL LOW (ref 3.87–5.11)
RDW: 18.2 % — ABNORMAL HIGH (ref 11.5–15.5)
WBC: 6.5 10*3/uL (ref 4.0–10.5)
nRBC: 0.3 % — ABNORMAL HIGH (ref 0.0–0.2)

## 2021-09-21 LAB — HEPATIC FUNCTION PANEL
ALT: 706 U/L — ABNORMAL HIGH (ref 0–44)
AST: 173 U/L — ABNORMAL HIGH (ref 15–41)
Albumin: 2.8 g/dL — ABNORMAL LOW (ref 3.5–5.0)
Alkaline Phosphatase: 59 U/L (ref 38–126)
Bilirubin, Direct: 0.1 mg/dL (ref 0.0–0.2)
Indirect Bilirubin: 0.4 mg/dL (ref 0.3–0.9)
Total Bilirubin: 0.5 mg/dL (ref 0.3–1.2)
Total Protein: 5.5 g/dL — ABNORMAL LOW (ref 6.5–8.1)

## 2021-09-21 LAB — BASIC METABOLIC PANEL
Anion gap: 8 (ref 5–15)
BUN: 79 mg/dL — ABNORMAL HIGH (ref 8–23)
CO2: 23 mmol/L (ref 22–32)
Calcium: 7.7 mg/dL — ABNORMAL LOW (ref 8.9–10.3)
Chloride: 97 mmol/L — ABNORMAL LOW (ref 98–111)
Creatinine, Ser: 3.95 mg/dL — ABNORMAL HIGH (ref 0.44–1.00)
GFR, Estimated: 11 mL/min — ABNORMAL LOW (ref >60)
Glucose, Bld: 125 mg/dL — ABNORMAL HIGH (ref 70–99)
Potassium: 4.8 mmol/L (ref 3.5–5.1)
Sodium: 128 mmol/L — ABNORMAL LOW (ref 135–145)

## 2021-09-21 MED ORDER — AZITHROMYCIN 250 MG PO TABS
500.0000 mg | ORAL_TABLET | Freq: Every evening | ORAL | Status: DC
  Administered 2021-09-21 – 2021-09-22 (×2): 500 mg via ORAL
  Filled 2021-09-21 (×2): qty 2

## 2021-09-21 MED ORDER — HEPARIN SODIUM (PORCINE) 5000 UNIT/ML IJ SOLN
5000.0000 [IU] | Freq: Three times a day (TID) | INTRAMUSCULAR | Status: DC
  Administered 2021-09-21 – 2021-09-25 (×12): 5000 [IU] via SUBCUTANEOUS
  Filled 2021-09-21 (×12): qty 1

## 2021-09-21 NOTE — Progress Notes (Signed)
[facility] Encounter Note  Patient: Julie [name] / Admit Date: 09/19/2021 / Date of Encounter: 09/21/2021, 2:02 PM   Subjective: Patient is breathing and oxygenation much improved from yesterday.  Patient does have some basilar crackles and also some rhonchi.  Urine output equal at this time despite acute kidney injury which is appears not to have been improving.  Patient remains anemic but not changed from yesterday.  Elevated troponin most consistent with hypoxia and demand ischemia rather than acute coronary syndrome.  No evidence of chest pain or true anginal symptoms at this time.  Encephalopathy has improved  Echocardiogram showing normal LV systolic function with no evidence of significant valvular heart disease and ejection fraction greater than 60%  Review of Systems: Positive for: Shortness of breath Negative for: Vision change, hearing change, syncope, dizziness, nausea, vomiting,diarrhea, bloody stool, stomach pain, cough, congestion, diaphoresis, urinary frequency, urinary pain,skin lesions, skin rashes Others previously listed  Objective: Telemetry: Normal sinus rhythm Physical Exam: Blood pressure (!) 100/45, pulse 61, temperature 98 F (36.7 C), temperature source Oral, resp. rate 20, height 5\' 5"  (1.651 m), weight 136.1 kg, SpO2 91 %. Body mass index is 49.92 kg/m. General: Well developed, well nourished, in no acute distress. Head: Normocephalic, atraumatic, sclera non-icteric, no xanthomas, nares are without discharge. Neck: No apparent masses Lungs: Normal respirations with few wheezes, some rhonchi, no rales , basilar crackles left basilar decreased breath sounds  Heart: Regular rate and rhythm, normal S1 S2, no murmur, no rub, no gallop, PMI is normal size and placement, carotid upstroke normal without bruit, jugular venous pressure normal Abdomen: Soft, non-tender, non-distended with normoactive bowel sounds. No hepatosplenomegaly. Abdominal  aorta is normal size without bruit Extremities: 1+ edema, no clubbing, no cyanosis, no ulcers,  Peripheral: 2+ radial, 2+ femoral, 2+ dorsal pedal pulses Neuro: Alert and oriented. Moves all extremities spontaneously. Psych:  Responds to questions appropriately with a normal affect.   Intake/Output Summary (Last 24 hours) at 09/21/2021 1402 Last data filed at 09/21/2021 1100 Gross per 24 hour  Intake 914.24 ml  Output 1250 ml  Net -335.76 ml    Inpatient Medications:  . amLODipine  10 mg Oral Daily  . carvedilol  12.5 mg Oral BID WC  . Chlorhexidine Gluconate Cloth  6 each Topical Daily  . cholecalciferol  1,000 Units Oral Daily  . furosemide  60 mg Intravenous BID  . insulin aspart  0-9 Units Subcutaneous TID PC & HS  . multivitamin with minerals  1 tablet Oral Daily  . omega-3 acid ethyl esters  1 g Oral Daily   Infusions:  . azithromycin 500 mg (09/20/21 2204)  . cefTRIAXone (ROCEPHIN)  IV 2 g (09/20/21 2308)    Labs: Recent Labs    09/20/21 0451 09/21/21 0452  NA 134* 128*  K 5.1 4.8  CL 100 97*  CO2 24 23  GLUCOSE 143* 125*  BUN 82* 79*  CREATININE 4.41* 3.95*  CALCIUM 8.4* 7.7*   Recent Labs    09/20/21 0451 09/21/21 0452  AST 485* 173*  ALT 1,046* 706*  ALKPHOS 65 59  BILITOT 0.9 0.5  PROT 5.9* 5.5*  ALBUMIN 3.1* 2.8*   Recent Labs    09/19/21 1859 09/20/21 0451 09/21/21 0452  WBC 11.1* 9.8 6.5  NEUTROABS 9.7*  --   --   HGB 10.6* 9.7* 9.0*  HCT 34.9* 31.5* 29.5*  MCV 82.1 81.4 79.1*  PLT 145* 143* 128*   Recent Labs    09/19/21 1859  CKTOTAL 487*   Invalid input(s): POCBNP No results for input(s): HGBA1C in the last 72 hours.   Weights: Filed Weights   09/19/21 1849  Weight: 136.1 kg     Radiology/Studies:  CT HEAD WO CONTRAST (5MM)  Result Date: 09/19/2021 CLINICAL DATA:  Altered mental status, initial encounter EXAM: CT HEAD WITHOUT CONTRAST CT CERVICAL SPINE WITHOUT CONTRAST TECHNIQUE: Multidetector CT imaging of the head  and cervical spine was performed following the standard protocol without intravenous contrast. Multiplanar CT image reconstructions of the cervical spine were also generated. COMPARISON:  07/03/2018 FINDINGS: CT HEAD FINDINGS Brain: No evidence of acute infarction, hemorrhage, hydrocephalus, extra-axial collection or mass lesion/mass effect. Vascular: No hyperdense vessel or unexpected calcification. Skull: Normal. Negative for fracture or focal lesion. Sinuses/Orbits: No acute finding. Other: None. CT CERVICAL SPINE FINDINGS Alignment: Loss of normal cervical lordosis is noted likely related to muscular spasm. Skull base and vertebrae: 7 cervical segments are well visualized. Vertebral body height is well maintained. No acute fracture or acute facet abnormality is noted. The odontoid is within normal limits. Soft tissues and spinal canal: Surrounding soft tissue structures show vascular calcifications. No focal hematoma is noted. Upper chest: Visualized lung apices are within normal limits. Other: None IMPRESSION: CT of the head: No acute intracranial abnormality noted. CT of cervical spine: Mild loss of the normal cervical lordosis likely related to muscular spasm. No acute bony abnormality is noted. Electronically Signed   By: Inez Catalina M.D.   On: 09/19/2021 19:50   CT Chest Wo Contrast  Result Date: 09/19/2021 CLINICAL DATA:  Persistent cough. EXAM: CT CHEST WITHOUT CONTRAST TECHNIQUE: Multidetector CT imaging of the chest was performed following the standard protocol without IV contrast. COMPARISON:  Chest radiograph dated 09/19/2021 and CT dated 09/16/2021. FINDINGS: Evaluation of this exam is limited in the absence of intravenous contrast as well as due to respiratory motion. Cardiovascular: Mild cardiomegaly. No pericardial effusion. Advanced 3 vessel coronary vascular calcification. Mild atherosclerotic calcification of the thoracic aorta. No aneurysmal dilatation. The central pulmonary arteries are  grossly unremarkable. Mediastinum/Nodes: No hilar or mediastinal adenopathy. The esophagus is grossly unremarkable. No mediastinal fluid collection. Lungs/Pleura: There is eventration of the left hemidiaphragm. There is partial consolidative changes of the left lower lobe which may represent atelectasis or pneumonia. Right lung base subsegmental densities may represent atelectasis. There is diffuse mosaic attenuation of the lung with areas of air trapping which may represent a underlying small airways versus small vessel disease. No pleural effusion or pneumothorax. The central airways are patent. Upper Abdomen: Multiple gallstones. Musculoskeletal: Degenerative changes of the spine. No acute osseous pathology the IMPRESSION: 1. Partial consolidative changes of the left lower lobe may represent atelectasis or pneumonia. 2. Mosaic attenuation of the lung parenchyma with areas of air trapping likely representing underlying small airways versus small vessel disease. 3. Cholelithiasis. 4. Aortic Atherosclerosis (ICD10-I70.0). Electronically Signed   By: Anner Crete M.D.   On: 09/19/2021 19:57   CT CHEST WO CONTRAST  Result Date: 09/16/2021 CLINICAL DATA:  Back pain following coughing,, history of recent fall, initial encounter initial encounter EXAM: CT CHEST WITHOUT CONTRAST TECHNIQUE: Multidetector CT imaging of the chest was performed following the standard protocol without IV contrast. COMPARISON:  07/24/2021 FINDINGS: Cardiovascular: Limited due to lack of IV contrast. Atherosclerotic calcifications of the aorta are noted without aneurysmal dilatation. The heart is at the upper limits of normal in size. Coronary calcifications are seen. No enlargement of pulmonary artery is noted. Mediastinum/Nodes: Thoracic inlet is  within normal limits. Scattered small mediastinal lymph nodes are noted stable from a prior CT from 07/07/2021. These are likely reactive in nature but not significant by size criteria. The  esophagus as visualized is within normal limits. Lungs/Pleura: Bibasilar atelectatic changes are noted. No sizable effusion is seen. Some mosaic attenuation is noted throughout both lungs stable in appearance from the prior exam. No focal nodule is seen. No confluent infiltrate is noted. Upper Abdomen: Multiple gallstones are noted in the right upper quadrant. Remainder of the upper abdomen appears within normal limits. Musculoskeletal: No acute rib abnormality is noted. No acute compression deformity is seen. No sternal fracture is noted. IMPRESSION: Cholelithiasis without complicating factors. No compression deformity or rib fracture is noted. Stable changes of mosaic attenuation within the lungs likely representing some air trapping. No focal confluent infiltrate is seen. Mild bibasilar atelectasis is noted. Aortic Atherosclerosis (ICD10-I70.0). Electronically Signed   By: Inez Catalina M.D.   On: 09/16/2021 19:18   CT Cervical Spine Wo Contrast  Result Date: 09/19/2021 CLINICAL DATA:  Altered mental status, initial encounter EXAM: CT HEAD WITHOUT CONTRAST CT CERVICAL SPINE WITHOUT CONTRAST TECHNIQUE: Multidetector CT imaging of the head and cervical spine was performed following the standard protocol without intravenous contrast. Multiplanar CT image reconstructions of the cervical spine were also generated. COMPARISON:  07/03/2018 FINDINGS: CT HEAD FINDINGS Brain: No evidence of acute infarction, hemorrhage, hydrocephalus, extra-axial collection or mass lesion/mass effect. Vascular: No hyperdense vessel or unexpected calcification. Skull: Normal. Negative for fracture or focal lesion. Sinuses/Orbits: No acute finding. Other: None. CT CERVICAL SPINE FINDINGS Alignment: Loss of normal cervical lordosis is noted likely related to muscular spasm. Skull base and vertebrae: 7 cervical segments are well visualized. Vertebral body height is well maintained. No acute fracture or acute facet abnormality is noted. The  odontoid is within normal limits. Soft tissues and spinal canal: Surrounding soft tissue structures show vascular calcifications. No focal hematoma is noted. Upper chest: Visualized lung apices are within normal limits. Other: None IMPRESSION: CT of the head: No acute intracranial abnormality noted. CT of cervical spine: Mild loss of the normal cervical lordosis likely related to muscular spasm. No acute bony abnormality is noted. Electronically Signed   By: Inez Catalina M.D.   On: 09/19/2021 19:50   US RENAL  Result Date: 09/20/2021 CLINICAL DATA:  Acute kidney insufficiency. EXAM: RENAL / URINARY TRACT ULTRASOUND COMPLETE COMPARISON:  None. FINDINGS: Right Kidney: Renal measurements: 11.8 x 6.3 x 5.3 cm = volume: 203 mL. Echogenicity within normal limits. No mass or hydronephrosis visualized. Minimal perinephric free fluid is noted. Left Kidney: Renal measurements: 12.5 x 5.6 x 5.9 cm = volume: 216 mL. Echogenicity within normal limits. No mass or hydronephrosis visualized. Bladder: Appears normal for degree of bladder distention. Other: None. IMPRESSION: Unremarkable bilateral renal ultrasound Electronically Signed   By: San Morelle M.D.   On: 09/20/2021 15:27   DG Chest Portable 1 View  Result Date: 09/19/2021 CLINICAL DATA:  Shortness of breath EXAM: PORTABLE CHEST 1 VIEW COMPARISON:  07/24/2021 FINDINGS: Low lung volumes. Cardiomegaly with central bronchovascular crowding. Bilateral bronchitic changes without focal opacity, pleural effusion or pneumothorax. IMPRESSION: Cardiomegaly with low lung volumes causing central bronchovascular crowding. Bronchitic changes without definitive focal pulmonary opacity. Electronically Signed   By: Donavan Foil M.D.   On: 09/19/2021 19:06   ECHOCARDIOGRAM COMPLETE  Result Date: 09/20/2021    ECHOCARDIOGRAM REPORT   Patient Name:   EMIAH PELLICANO Date of Exam: 09/20/2021 Medical Rec #:  [id number]         Height:       65.0 in Accession #:    [id number]         Weight:       300.0 lb Date of Birth:  Apr 18, 1946         BSA:          2.350 m Patient Age:    50 years          BP:           116/52 mmHg Patient Gender: F                 HR:           64 bpm. Exam Location:  [facility] Procedure: 2D Echo, Color Doppler, Cardiac Doppler and Intracardiac            Opacification Agent Indications:     I21.4 NSTEMI  History:         Patient has no prior history of Echocardiogram examinations.                  CHF, CKD; Risk Factors:Hypertension, Dyslipidemia and Diabetes.  Sonographer:     Charmayne Sheer Referring Phys:  [id number] [place] Diagnosing Phys: Serafina Royals MD  Sonographer Comments: Technically difficult study due to poor echo windows. Image acquisition challenging due to patient body habitus. IMPRESSIONS  1. Left ventricular ejection fraction, by estimation, is 65 to 70%. The left ventricle has normal function. The left ventricle has no regional wall motion abnormalities. Left ventricular diastolic parameters were normal.  2. Right ventricular systolic function is normal. The right ventricular size is normal.  3. The mitral valve is normal in structure. Mild mitral valve regurgitation.  4. The aortic valve is normal in structure. Aortic valve regurgitation is not visualized. FINDINGS  Left Ventricle: Left ventricular ejection fraction, by estimation, is 65 to 70%. The left ventricle has normal function. The left ventricle has no regional wall motion abnormalities. Definity contrast agent was given IV to delineate the left ventricular  endocardial borders. The left ventricular internal cavity size was small. There is no left ventricular hypertrophy. Left ventricular diastolic parameters were normal. Right Ventricle: The right ventricular size is normal. No increase in right ventricular wall thickness. Right ventricular systolic function is normal. Left Atrium: Left atrial size was normal in size. Right Atrium: Right atrial size was normal in size. Pericardium: There is  no evidence of pericardial effusion. Mitral Valve: The mitral valve is normal in structure. Mild mitral valve regurgitation. MV peak gradient, 6.0 mmHg. The mean mitral valve gradient is 2.0 mmHg. Tricuspid Valve: The tricuspid valve is normal in structure. Tricuspid valve regurgitation is trivial. Aortic Valve: The aortic valve is normal in structure. Aortic valve regurgitation is not visualized. Aortic valve mean gradient measures 8.0 mmHg. Aortic valve peak gradient measures 12.5 mmHg. Aortic valve area, by VTI measures 1.55 cm. Pulmonic Valve: The pulmonic valve was normal in structure. Pulmonic valve regurgitation is not visualized. Aorta: The aortic root and ascending aorta are structurally normal, with no evidence of dilitation. IAS/Shunts: No atrial level shunt detected by color flow Doppler.  LEFT VENTRICLE PLAX 2D LVIDd:         4.55 cm   Diastology LVIDs:         3.38 cm   LV e' medial:    9.79 cm/s LV PW:         1.27 cm   LV  E/e' medial:  10.3 LV IVS:        0.81 cm   LV e' lateral:   7.29 cm/s LVOT diam:     1.90 cm   LV E/e' lateral: 13.9 LV SV:         55 LV SV Index:   23 LVOT Area:     2.84 cm  RIGHT VENTRICLE RV Basal diam:  4.94 cm RV Mid diam:    5.31 cm LEFT ATRIUM             Index        RIGHT ATRIUM           Index LA diam:        4.30 cm 1.83 cm/m   RA Area:     23.70 cm LA Vol (A2C):   72.9 ml 31.02 ml/m  RA Volume:   78.10 ml  33.23 ml/m LA Vol (A4C):   65.1 ml 27.70 ml/m LA Biplane Vol: 70.7 ml 30.08 ml/m  AORTIC VALVE                     PULMONIC VALVE AV Area (Vmax):    1.47 cm      PV Vmax:       0.81 m/s AV Area (Vmean):   1.38 cm      PV Vmean:      60.700 cm/s AV Area (VTI):     1.55 cm      PV VTI:        0.176 m AV Vmax:           177.00 cm/s   PV Peak grad:  2.6 mmHg AV Vmean:          130.000 cm/s  PV Mean grad:  2.0 mmHg AV VTI:            0.355 m AV Peak Grad:      12.5 mmHg AV Mean Grad:      8.0 mmHg LVOT Vmax:         92.00 cm/s LVOT Vmean:        63.400 cm/s  LVOT VTI:          0.194 m LVOT/AV VTI ratio: 0.55  AORTA Ao Root diam: 2.80 cm MITRAL VALVE MV Area (PHT): 2.48 cm     SHUNTS MV Area VTI:   1.41 cm     Systemic VTI:  0.19 m MV Peak grad:  6.0 mmHg     Systemic Diam: 1.90 cm MV Mean grad:  2.0 mmHg MV Vmax:       1.22 m/s MV Vmean:      69.2 cm/s MV Decel Time: 306 msec MV E velocity: 101.00 cm/s MV A velocity: 115.00 cm/s MV E/A ratio:  0.88 Serafina Royals MD Electronically signed by Serafina Royals MD Signature Date/Time: 09/20/2021/12:07:16 PM    Final      Assessment and Recommendation  75 y.o. female with known acute on chronic diastolic dysfunction congestive heart failure multifactorial in nature including possible atelectasis, hypoxia, acute kidney injury, and anemia without evidence of acute coronary syndrome slightly improved 1.  Continue supportive care encephalopathy and possible infection with empiric antibiotics and oxygen supplementation 2.  Continue hypertension control with amlodipine and carvedilol.  Will avoid angiotensin receptor blockers and ACE inhibitors 3.  Patient currently receiving intravenous Lasix which may be helping although some concerns for acute kidney injury which may require adjustments and or Lasix drip.  Would consider  the possibility of nephrology consultation for further discussion 4.  No further cardiac diagnostics necessary at this time due to no evidence of acute coronary syndrome or changes in LV function by echocardiogram 5.  Further consideration of rehab facility after above  Signed, Serafina Royals M.D. FACC

## 2021-09-21 NOTE — Progress Notes (Addendum)
Progress Note    Julie [name]  UKG:[id number] DOB: April 08, 1946  DOA: 09/19/2021 PCP: Gladstone Lighter, MD      Brief Narrative:    Medical records reviewed and are as summarized below:  Julie [name] is a 75 y.o. female with medical history significant for chronic diastolic CHF, stage IIIb CKD, chronic hypoxemic respiratory failure on 2 L/min oxygen at home, type II DM, hypertension, dyslipidemia, osteoporosis.  She presented to the hospital because of altered mental status shortness of breath that is worse with exertion, orthopnea and lower extremity edema.  History is significant for a fall at home about 2 days prior to admission.  Reportedly oxygen saturation was 68% on room air when EMS picked her up.  Her troponins evaluated ([phone]).  She was admitted to the hospital for acute on chronic diastolic CHF, acute on chronic hypoxemic respiratory failure and acute metabolic encephalopathy.  Elevated troponins were attributed to demand ischemia per cardiologist.  She was also found to have AKI and elevated liver enzymes probably from acute CHF.    Assessment/Plan:   Principal Problem:   Demand ischemia (HCC) Active Problems:   Acute on chronic respiratory failure with hypoxia (HCC)   Acute on chronic diastolic CHF (congestive heart failure) (HCC)   Body mass index is 49.92 kg/m.  (Morbid obesity)   Acute on chronic diastolic CHF: Continue IV Lasix.  Monitor BMP, daily weight and urine output.   2D echo showed EF estimated at 65 to 70%, normal LV diastolic parameters.  BNP was 848.5.  Elevated troponins: Probably from demand ischemia.  No chest pain. No plan for left heart cath  Acute on chronic hypoxemic respiratory failure: She remains on 4 L/min oxygen.  She uses 2 L/min oxygen at home although she is not medically adherent with oxygen therapy according to her grandson.  Left lower lobe consolidation concerning for atelectasis or pneumonia: Blood culture  showed staph lugdunensis and staph epidermidis which are likely contaminants.  Continue empiric IV antibiotics  AKI on CKD stage IIIb, hyperkalemia: Probably from cardiorenal syndrome.  Urine output has improved and creatinine is trending down.  Continue IV Lasix.  Follow-up with nephrologist.  Hyponatremia: Sodium level is trending down.  She has been placed on fluid restriction up to 1.2 L/day.  Elevated liver enzymes: This is probably due to acute CHF.  She has cholelithiasis noted on CT chest.  Repeat liver enzymes.  Acute metabolic encephalopathy: Improved  Type II DM: Hold glipizide.  Use NovoLog as needed for hyperglycemia.  Generalized weakness/fall at home: Consult PT and OT   Diet Order             Diet Heart Room service appropriate? Yes; Fluid consistency: Thin; Fluid restriction: 1200 mL Fluid  Diet effective now                      Consultants: Cardiologist Nephrologist  Procedures: None    Medications:    amLODipine  10 mg Oral Daily   carvedilol  12.5 mg Oral BID WC   Chlorhexidine Gluconate Cloth  6 each Topical Daily   cholecalciferol  1,000 Units Oral Daily   furosemide  60 mg Intravenous BID   insulin aspart  0-9 Units Subcutaneous TID PC & HS   multivitamin with minerals  1 tablet Oral Daily   omega-3 acid ethyl esters  1 g Oral Daily   Continuous Infusions:  azithromycin 500 mg (09/20/21 2204)   cefTRIAXone (  ROCEPHIN)  IV 2 g (09/20/21 2308)     Anti-infectives (From admission, onward)    Start     Dose/Rate Route Frequency Ordered Stop   09/19/21 2030  cefTRIAXone (ROCEPHIN) 2 g in sodium chloride 0.9 % 100 mL IVPB        2 g 200 mL/hr over 30 Minutes Intravenous Every 24 hours 09/19/21 2028 09/24/21 2029   09/19/21 2030  azithromycin (ZITHROMAX) 500 mg in sodium chloride 0.9 % 250 mL IVPB        500 mg 250 mL/hr over 60 Minutes Intravenous Every 24 hours 09/19/21 2028 09/24/21 2029              Family  Communication/Anticipated D/C date and plan/Code Status   DVT prophylaxis:      Code Status: DNR  Family Communication: None Disposition Plan: Possible discharge to home in 2 to 3 days   Status is: Inpatient  Remains inpatient appropriate because: Acute heart failure           Subjective:   Interval events noted.  She feels a little better today.  Her breathing is getting better.  She still has swelling in her legs.  No chest pain.  Objective:    Vitals:   09/20/21 1900 09/20/21 1930 09/20/21 2106 09/21/21 0735  BP: (!) 114/57 (!) 116/50 (!) 119/54 (!) 117/57  Pulse: 72 63 61 60  Resp: 19 16 20    Temp:   97.7 F (36.5 C) (!) 97.4 F (36.3 C)  TempSrc:   Oral   SpO2: 97% 91% 93% 93%  Weight:      Height:       No data found.   Intake/Output Summary (Last 24 hours) at 09/21/2021 1046 Last data filed at 09/21/2021 0900 Gross per 24 hour  Intake 562.37 ml  Output 550 ml  Net 12.37 ml   Filed Weights   09/19/21 1849  Weight: 136.1 kg    Exam:  GEN: NAD SKIN: Warm and dry EYES: EOMI ENT: MMM CV: RRR PULM: Bibasilar rales.  No wheezing heard ABD: soft, ND, NT, +BS CNS: AAO x 3, non focal EXT: Bilateral leg edema (2+), no tenderness         Data Reviewed:   I have personally reviewed following labs and imaging studies:  Labs: Labs show the following:   Basic Metabolic Panel: Recent Labs  Lab 09/16/21 1737 09/19/21 1859 09/20/21 0451 09/21/21 0452  NA 137 134* 134* 128*  K 4.2 5.7* 5.1 4.8  CL 100 97* 100 97*  CO2 29 24 24 23   GLUCOSE 164* 182* 143* 125*  BUN 42* 77* 82* 79*  CREATININE 1.86* 4.68* 4.41* 3.95*  CALCIUM 8.8* 8.8* 8.4* 7.7*   GFR Estimated Creatinine Clearance: 17.7 mL/min (A) (by C-G formula based on SCr of 3.95 mg/dL (H)). Liver Function Tests: Recent Labs  Lab 09/16/21 1737 09/19/21 1859 09/20/21 0451  AST 18 874* 485*  ALT 19 1,399* 1,046*  ALKPHOS 69 77 65  BILITOT 0.9 1.1 0.9  PROT 6.7 6.7 5.9*   ALBUMIN 3.6 3.6 3.1*   No results for input(s): LIPASE, AMYLASE in the last 168 hours. No results for input(s): AMMONIA in the last 168 hours. Coagulation profile Recent Labs  Lab 09/19/21 1859  INR 1.3*    CBC: Recent Labs  Lab 09/16/21 1737 09/19/21 1859 09/20/21 0451 09/21/21 0452  WBC 5.6 11.1* 9.8 6.5  NEUTROABS 4.4 9.7*  --   --   HGB 10.2* 10.6* 9.7*  9.0*  HCT 33.9* 34.9* 31.5* 29.5*  MCV 82.5 82.1 81.4 79.1*  PLT 170 145* 143* 128*   Cardiac Enzymes: Recent Labs  Lab 09/19/21 1859  CKTOTAL 487*   BNP (last 3 results) No results for input(s): PROBNP in the last 8760 hours. CBG: Recent Labs  Lab 09/20/21 0800 09/20/21 1131 09/20/21 1757 09/20/21 2116 09/21/21 0736  GLUCAP 140* 120* 141* 192* 117*   D-Dimer: No results for input(s): DDIMER in the last 72 hours. Hgb A1c: No results for input(s): HGBA1C in the last 72 hours. Lipid Profile: No results for input(s): CHOL, HDL, LDLCALC, TRIG, CHOLHDL, LDLDIRECT in the last 72 hours. Thyroid function studies: No results for input(s): TSH, T4TOTAL, T3FREE, THYROIDAB in the last 72 hours.  Invalid input(s): FREET3 Anemia work up: No results for input(s): VITAMINB12, FOLATE, FERRITIN, TIBC, IRON, RETICCTPCT in the last 72 hours. Sepsis Labs: Recent Labs  Lab 09/16/21 1737 09/19/21 1859 09/19/21 2043 09/20/21 0451 09/21/21 0452  WBC 5.6 11.1*  --  9.8 6.5  LATICACIDVEN  --   --  0.9 0.7  --     Microbiology Recent Results (from the past 240 hour(s))  Resp Panel by RT-PCR (Flu A&B, Covid) Nasopharyngeal Swab     Status: None   Collection Time: 09/16/21  7:45 PM   Specimen: Nasopharyngeal Swab; Nasopharyngeal(NP) swabs in vial transport medium  Result Value Ref Range Status   SARS Coronavirus 2 by RT PCR NEGATIVE NEGATIVE Final    Comment: (NOTE) SARS-CoV-2 target nucleic acids are NOT DETECTED.  The SARS-CoV-2 RNA is generally detectable in upper respiratory specimens during the acute phase of  infection. The lowest concentration of SARS-CoV-2 viral copies this assay can detect is 138 copies/mL. A negative result does not preclude SARS-Cov-2 infection and should not be used as the sole basis for treatment or other patient management decisions. A negative result may occur with  improper specimen collection/handling, submission of specimen other than nasopharyngeal swab, presence of viral mutation(s) within the areas targeted by this assay, and inadequate number of viral copies(<138 copies/mL). A negative result must be combined with clinical observations, patient history, and epidemiological information. The expected result is Negative.  Fact Sheet for Patients:  EntrepreneurPulse.com.au  Fact Sheet for Healthcare Providers:  IncredibleEmployment.be  This test is no t yet approved or cleared by the [place] FDA and  has been authorized for detection and/or diagnosis of SARS-CoV-2 by FDA under an Emergency Use Authorization (EUA). This EUA will remain  in effect (meaning this test can be used) for the duration of the COVID-19 declaration under Section 564(b)(1) of the Act, 21 U.S.C.section 360bbb-3(b)(1), unless the authorization is terminated  or revoked sooner.       Influenza A by PCR NEGATIVE NEGATIVE Final   Influenza B by PCR NEGATIVE NEGATIVE Final    Comment: (NOTE) The Xpert Xpress SARS-CoV-2/FLU/RSV plus assay is intended as an aid in the diagnosis of influenza from Nasopharyngeal swab specimens and should not be used as a sole basis for treatment. Nasal washings and aspirates are unacceptable for Xpert Xpress SARS-CoV-2/FLU/RSV testing.  Fact Sheet for Patients: EntrepreneurPulse.com.au  Fact Sheet for Healthcare Providers: IncredibleEmployment.be  This test is not yet approved or cleared by the [place] FDA and has been authorized for detection and/or diagnosis of SARS-CoV-2  by FDA under an Emergency Use Authorization (EUA). This EUA will remain in effect (meaning this test can be used) for the duration of the COVID-19 declaration under Section 564(b)(1) of the Act,  21 U.S.C. section 360bbb-3(b)(1), unless the authorization is terminated or revoked.  Performed at [facility], [address] [place], [place] [address]   Resp Panel by RT-PCR (Flu A&B, Covid) Nasopharyngeal Swab     Status: None   Collection Time: 09/19/21  6:59 PM   Specimen: Nasopharyngeal Swab; Nasopharyngeal(NP) swabs in vial transport medium  Result Value Ref Range Status   SARS Coronavirus 2 by RT PCR NEGATIVE NEGATIVE Final    Comment: (NOTE) SARS-CoV-2 target nucleic acids are NOT DETECTED.  The SARS-CoV-2 RNA is generally detectable in upper respiratory specimens during the acute phase of infection. The lowest concentration of SARS-CoV-2 viral copies this assay can detect is 138 copies/mL. A negative result does not preclude SARS-Cov-2 infection and should not be used as the sole basis for treatment or other patient management decisions. A negative result may occur with  improper specimen collection/handling, submission of specimen other than nasopharyngeal swab, presence of viral mutation(s) within the areas targeted by this assay, and inadequate number of viral copies(<138 copies/mL). A negative result must be combined with clinical observations, patient history, and epidemiological information. The expected result is Negative.  Fact Sheet for Patients:  EntrepreneurPulse.com.au  Fact Sheet for Healthcare Providers:  IncredibleEmployment.be  This test is no t yet approved or cleared by the [place] FDA and  has been authorized for detection and/or diagnosis of SARS-CoV-2 by FDA under an Emergency Use Authorization (EUA). This EUA will remain  in effect (meaning this test can be used) for the duration of the COVID-19  declaration under Section 564(b)(1) of the Act, 21 U.S.C.section 360bbb-3(b)(1), unless the authorization is terminated  or revoked sooner.       Influenza A by PCR NEGATIVE NEGATIVE Final   Influenza B by PCR NEGATIVE NEGATIVE Final    Comment: (NOTE) The Xpert Xpress SARS-CoV-2/FLU/RSV plus assay is intended as an aid in the diagnosis of influenza from Nasopharyngeal swab specimens and should not be used as a sole basis for treatment. Nasal washings and aspirates are unacceptable for Xpert Xpress SARS-CoV-2/FLU/RSV testing.  Fact Sheet for Patients: EntrepreneurPulse.com.au  Fact Sheet for Healthcare Providers: IncredibleEmployment.be  This test is not yet approved or cleared by the [place] FDA and has been authorized for detection and/or diagnosis of SARS-CoV-2 by FDA under an Emergency Use Authorization (EUA). This EUA will remain in effect (meaning this test can be used) for the duration of the COVID-19 declaration under Section 564(b)(1) of the Act, 21 U.S.C. section 360bbb-3(b)(1), unless the authorization is terminated or revoked.  Performed at [facility], [address] [place], [place] [address]   Blood culture (routine x 2)     Status: None (Preliminary result)   Collection Time: 09/19/21  8:43 PM   Specimen: BLOOD  Result Value Ref Range Status   Specimen Description BLOOD RAC  Final   Special Requests   Final    BOTTLES DRAWN AEROBIC AND ANAEROBIC Blood Culture adequate volume   Culture   Final    NO GROWTH 2 DAYS Performed at [facility], [address] [place], [place] [address]    Report Status PENDING  Incomplete  Blood culture (routine x 2)     Status: Abnormal (Preliminary result)   Collection Time: 09/19/21  8:43 PM   Specimen: BLOOD  Result Value Ref Range Status   Specimen Description   Final    BLOOD LAC Performed at [facility], [address], [place], [place]  [address]  Special Requests   Final    BOTTLES DRAWN AEROBIC AND ANAEROBIC Blood Culture adequate volume Performed at [facility], [place], [address] [place], [place] [address]    Culture  Setup Time   Final    Organism ID to follow GRAM POSITIVE COCCI IN BOTH AEROBIC AND ANAEROBIC BOTTLES CRITICAL RESULT CALLED TO, READ BACK BY AND VERIFIED WITH: Laqueta Carina 1506 09/20/21 MU Performed at [facility], [address] [place], [place] [address]    Culture (A)  Final    STAPHYLOCOCCUS LUGDUNENSIS CULTURE REINCUBATED FOR BETTER GROWTH Performed at [facility], [place] [address], [place], [place] [address]    Report Status PENDING  Incomplete  Blood Culture ID Panel (Reflexed)     Status: Abnormal   Collection Time: 09/19/21  8:43 PM  Result Value Ref Range Status   Enterococcus faecalis NOT DETECTED NOT DETECTED Final   Enterococcus Faecium NOT DETECTED NOT DETECTED Final   Listeria monocytogenes NOT DETECTED NOT DETECTED Final   Staphylococcus species DETECTED (A) NOT DETECTED Final    Comment: CRITICAL RESULT CALLED TO, READ BACK BY AND VERIFIED WITH: SUSAN WATSON 1506 09/20/21 MU    Staphylococcus aureus (BCID) NOT DETECTED NOT DETECTED Final   Staphylococcus epidermidis DETECTED (A) NOT DETECTED Final    Comment: Methicillin (oxacillin) resistant coagulase negative staphylococcus. Possible blood culture contaminant (unless isolated from more than one blood culture draw or clinical case suggests pathogenicity). No antibiotic treatment is indicated for blood  culture contaminants. CRITICAL RESULT CALLED TO, READ BACK BY AND VERIFIED WITH: SUSAN WATSON 1506 09/20/21 MU    Staphylococcus lugdunensis DETECTED (A) NOT DETECTED Final    Comment: Methicillin (oxacillin) resistant coagulase negative staphylococcus. Possible blood culture contaminant (unless isolated from more than one blood culture draw or clinical case suggests pathogenicity). No antibiotic treatment  is indicated for blood  culture contaminants. CRITICAL RESULT CALLED TO, READ BACK BY AND VERIFIED WITH: Brent [id number] 09/20/21 MU    Streptococcus species NOT DETECTED NOT DETECTED Final   Streptococcus agalactiae NOT DETECTED NOT DETECTED Final   Streptococcus pneumoniae NOT DETECTED NOT DETECTED Final   Streptococcus pyogenes NOT DETECTED NOT DETECTED Final   A.calcoaceticus-baumannii NOT DETECTED NOT DETECTED Final   Bacteroides fragilis NOT DETECTED NOT DETECTED Final   Enterobacterales NOT DETECTED NOT DETECTED Final   Enterobacter cloacae complex NOT DETECTED NOT DETECTED Final   Escherichia coli NOT DETECTED NOT DETECTED Final   Klebsiella aerogenes NOT DETECTED NOT DETECTED Final   Klebsiella oxytoca NOT DETECTED NOT DETECTED Final   Klebsiella pneumoniae NOT DETECTED NOT DETECTED Final   Proteus species NOT DETECTED NOT DETECTED Final   Salmonella species NOT DETECTED NOT DETECTED Final   Serratia marcescens NOT DETECTED NOT DETECTED Final   Haemophilus influenzae NOT DETECTED NOT DETECTED Final   Neisseria meningitidis NOT DETECTED NOT DETECTED Final   Pseudomonas aeruginosa NOT DETECTED NOT DETECTED Final   Stenotrophomonas maltophilia NOT DETECTED NOT DETECTED Final   Candida albicans NOT DETECTED NOT DETECTED Final   Candida auris NOT DETECTED NOT DETECTED Final   Candida glabrata NOT DETECTED NOT DETECTED Final   Candida krusei NOT DETECTED NOT DETECTED Final   Candida parapsilosis NOT DETECTED NOT DETECTED Final   Candida tropicalis NOT DETECTED NOT DETECTED Final   Cryptococcus neoformans/gattii NOT DETECTED NOT DETECTED Final   Methicillin resistance mecA/C DETECTED (A) NOT DETECTED Final    Comment: CRITICAL RESULT CALLED TO, READ BACK BY AND VERIFIED WITH: SUSAN WATSON 1506 09/20/21  MU Performed at [facility], [address] [place], [place] [address]     Procedures and diagnostic studies:  CT HEAD WO CONTRAST (5MM)  Result Date:  09/19/2021 CLINICAL DATA:  Altered mental status, initial encounter EXAM: CT HEAD WITHOUT CONTRAST CT CERVICAL SPINE WITHOUT CONTRAST TECHNIQUE: Multidetector CT imaging of the head and cervical spine was performed following the standard protocol without intravenous contrast. Multiplanar CT image reconstructions of the cervical spine were also generated. COMPARISON:  07/03/2018 FINDINGS: CT HEAD FINDINGS Brain: No evidence of acute infarction, hemorrhage, hydrocephalus, extra-axial collection or mass lesion/mass effect. Vascular: No hyperdense vessel or unexpected calcification. Skull: Normal. Negative for fracture or focal lesion. Sinuses/Orbits: No acute finding. Other: None. CT CERVICAL SPINE FINDINGS Alignment: Loss of normal cervical lordosis is noted likely related to muscular spasm. Skull base and vertebrae: 7 cervical segments are well visualized. Vertebral body height is well maintained. No acute fracture or acute facet abnormality is noted. The odontoid is within normal limits. Soft tissues and spinal canal: Surrounding soft tissue structures show vascular calcifications. No focal hematoma is noted. Upper chest: Visualized lung apices are within normal limits. Other: None IMPRESSION: CT of the head: No acute intracranial abnormality noted. CT of cervical spine: Mild loss of the normal cervical lordosis likely related to muscular spasm. No acute bony abnormality is noted. Electronically Signed   By: Inez Catalina M.D.   On: 09/19/2021 19:50   CT Chest Wo Contrast  Result Date: 09/19/2021 CLINICAL DATA:  Persistent cough. EXAM: CT CHEST WITHOUT CONTRAST TECHNIQUE: Multidetector CT imaging of the chest was performed following the standard protocol without IV contrast. COMPARISON:  Chest radiograph dated 09/19/2021 and CT dated 09/16/2021. FINDINGS: Evaluation of this exam is limited in the absence of intravenous contrast as well as due to respiratory motion. Cardiovascular: Mild cardiomegaly. No  pericardial effusion. Advanced 3 vessel coronary vascular calcification. Mild atherosclerotic calcification of the thoracic aorta. No aneurysmal dilatation. The central pulmonary arteries are grossly unremarkable. Mediastinum/Nodes: No hilar or mediastinal adenopathy. The esophagus is grossly unremarkable. No mediastinal fluid collection. Lungs/Pleura: There is eventration of the left hemidiaphragm. There is partial consolidative changes of the left lower lobe which may represent atelectasis or pneumonia. Right lung base subsegmental densities may represent atelectasis. There is diffuse mosaic attenuation of the lung with areas of air trapping which may represent a underlying small airways versus small vessel disease. No pleural effusion or pneumothorax. The central airways are patent. Upper Abdomen: Multiple gallstones. Musculoskeletal: Degenerative changes of the spine. No acute osseous pathology the IMPRESSION: 1. Partial consolidative changes of the left lower lobe may represent atelectasis or pneumonia. 2. Mosaic attenuation of the lung parenchyma with areas of air trapping likely representing underlying small airways versus small vessel disease. 3. Cholelithiasis. 4. Aortic Atherosclerosis (ICD10-I70.0). Electronically Signed   By: Anner Crete M.D.   On: 09/19/2021 19:57   CT Cervical Spine Wo Contrast  Result Date: 09/19/2021 CLINICAL DATA:  Altered mental status, initial encounter EXAM: CT HEAD WITHOUT CONTRAST CT CERVICAL SPINE WITHOUT CONTRAST TECHNIQUE: Multidetector CT imaging of the head and cervical spine was performed following the standard protocol without intravenous contrast. Multiplanar CT image reconstructions of the cervical spine were also generated. COMPARISON:  07/03/2018 FINDINGS: CT HEAD FINDINGS Brain: No evidence of acute infarction, hemorrhage, hydrocephalus, extra-axial collection or mass lesion/mass effect. Vascular: No hyperdense vessel or unexpected calcification. Skull:  Normal. Negative for fracture or focal lesion. Sinuses/Orbits: No acute finding. Other: None. CT CERVICAL SPINE FINDINGS Alignment: Loss  of normal cervical lordosis is noted likely related to muscular spasm. Skull base and vertebrae: 7 cervical segments are well visualized. Vertebral body height is well maintained. No acute fracture or acute facet abnormality is noted. The odontoid is within normal limits. Soft tissues and spinal canal: Surrounding soft tissue structures show vascular calcifications. No focal hematoma is noted. Upper chest: Visualized lung apices are within normal limits. Other: None IMPRESSION: CT of the head: No acute intracranial abnormality noted. CT of cervical spine: Mild loss of the normal cervical lordosis likely related to muscular spasm. No acute bony abnormality is noted. Electronically Signed   By: Inez Catalina M.D.   On: 09/19/2021 19:50   US RENAL  Result Date: 09/20/2021 CLINICAL DATA:  Acute kidney insufficiency. EXAM: RENAL / URINARY TRACT ULTRASOUND COMPLETE COMPARISON:  None. FINDINGS: Right Kidney: Renal measurements: 11.8 x 6.3 x 5.3 cm = volume: 203 mL. Echogenicity within normal limits. No mass or hydronephrosis visualized. Minimal perinephric free fluid is noted. Left Kidney: Renal measurements: 12.5 x 5.6 x 5.9 cm = volume: 216 mL. Echogenicity within normal limits. No mass or hydronephrosis visualized. Bladder: Appears normal for degree of bladder distention. Other: None. IMPRESSION: Unremarkable bilateral renal ultrasound Electronically Signed   By: San Morelle M.D.   On: 09/20/2021 15:27   DG Chest Portable 1 View  Result Date: 09/19/2021 CLINICAL DATA:  Shortness of breath EXAM: PORTABLE CHEST 1 VIEW COMPARISON:  07/24/2021 FINDINGS: Low lung volumes. Cardiomegaly with central bronchovascular crowding. Bilateral bronchitic changes without focal opacity, pleural effusion or pneumothorax. IMPRESSION: Cardiomegaly with low lung volumes causing central  bronchovascular crowding. Bronchitic changes without definitive focal pulmonary opacity. Electronically Signed   By: Donavan Foil M.D.   On: 09/19/2021 19:06   ECHOCARDIOGRAM COMPLETE  Result Date: 09/20/2021    ECHOCARDIOGRAM REPORT   Patient Name:   Julie [name] Date of Exam: 09/20/2021 Medical Rec #:  [id number]         Height:       65.0 in Accession #:    [id number]        Weight:       300.0 lb Date of Birth:  01-Apr-1946         BSA:          2.350 m Patient Age:    80 years          BP:           116/52 mmHg Patient Gender: F                 HR:           64 bpm. Exam Location:  [facility] Procedure: 2D Echo, Color Doppler, Cardiac Doppler and Intracardiac            Opacification Agent Indications:     I21.4 NSTEMI  History:         Patient has no prior history of Echocardiogram examinations.                  CHF, CKD; Risk Factors:Hypertension, Dyslipidemia and Diabetes.  Sonographer:     Charmayne Sheer Referring Phys:  [id number] Artois Diagnosing Phys: Serafina Royals MD  Sonographer Comments: Technically difficult study due to poor echo windows. Image acquisition challenging due to patient body habitus. IMPRESSIONS  1. Left ventricular ejection fraction, by estimation, is 65 to 70%. The left ventricle has normal function. The left ventricle has no regional wall motion abnormalities. Left ventricular diastolic parameters  were normal.  2. Right ventricular systolic function is normal. The right ventricular size is normal.  3. The mitral valve is normal in structure. Mild mitral valve regurgitation.  4. The aortic valve is normal in structure. Aortic valve regurgitation is not visualized. FINDINGS  Left Ventricle: Left ventricular ejection fraction, by estimation, is 65 to 70%. The left ventricle has normal function. The left ventricle has no regional wall motion abnormalities. Definity contrast agent was given IV to delineate the left ventricular  endocardial borders. The left ventricular internal cavity  size was small. There is no left ventricular hypertrophy. Left ventricular diastolic parameters were normal. Right Ventricle: The right ventricular size is normal. No increase in right ventricular wall thickness. Right ventricular systolic function is normal. Left Atrium: Left atrial size was normal in size. Right Atrium: Right atrial size was normal in size. Pericardium: There is no evidence of pericardial effusion. Mitral Valve: The mitral valve is normal in structure. Mild mitral valve regurgitation. MV peak gradient, 6.0 mmHg. The mean mitral valve gradient is 2.0 mmHg. Tricuspid Valve: The tricuspid valve is normal in structure. Tricuspid valve regurgitation is trivial. Aortic Valve: The aortic valve is normal in structure. Aortic valve regurgitation is not visualized. Aortic valve mean gradient measures 8.0 mmHg. Aortic valve peak gradient measures 12.5 mmHg. Aortic valve area, by VTI measures 1.55 cm. Pulmonic Valve: The pulmonic valve was normal in structure. Pulmonic valve regurgitation is not visualized. Aorta: The aortic root and ascending aorta are structurally normal, with no evidence of dilitation. IAS/Shunts: No atrial level shunt detected by color flow Doppler.  LEFT VENTRICLE PLAX 2D LVIDd:         4.55 cm   Diastology LVIDs:         3.38 cm   LV e' medial:    9.79 cm/s LV PW:         1.27 cm   LV E/e' medial:  10.3 LV IVS:        0.81 cm   LV e' lateral:   7.29 cm/s LVOT diam:     1.90 cm   LV E/e' lateral: 13.9 LV SV:         55 LV SV Index:   23 LVOT Area:     2.84 cm  RIGHT VENTRICLE RV Basal diam:  4.94 cm RV Mid diam:    5.31 cm LEFT ATRIUM             Index        RIGHT ATRIUM           Index LA diam:        4.30 cm 1.83 cm/m   RA Area:     23.70 cm LA Vol (A2C):   72.9 ml 31.02 ml/m  RA Volume:   78.10 ml  33.23 ml/m LA Vol (A4C):   65.1 ml 27.70 ml/m LA Biplane Vol: 70.7 ml 30.08 ml/m  AORTIC VALVE                     PULMONIC VALVE AV Area (Vmax):    1.47 cm      PV Vmax:        0.81 m/s AV Area (Vmean):   1.38 cm      PV Vmean:      60.700 cm/s AV Area (VTI):     1.55 cm      PV VTI:        0.176 m AV Vmax:  177.00 cm/s   PV Peak grad:  2.6 mmHg AV Vmean:          130.000 cm/s  PV Mean grad:  2.0 mmHg AV VTI:            0.355 m AV Peak Grad:      12.5 mmHg AV Mean Grad:      8.0 mmHg LVOT Vmax:         92.00 cm/s LVOT Vmean:        63.400 cm/s LVOT VTI:          0.194 m LVOT/AV VTI ratio: 0.55  AORTA Ao Root diam: 2.80 cm MITRAL VALVE MV Area (PHT): 2.48 cm     SHUNTS MV Area VTI:   1.41 cm     Systemic VTI:  0.19 m MV Peak grad:  6.0 mmHg     Systemic Diam: 1.90 cm MV Mean grad:  2.0 mmHg MV Vmax:       1.22 m/s MV Vmean:      69.2 cm/s MV Decel Time: 306 msec MV E velocity: 101.00 cm/s MV A velocity: 115.00 cm/s MV E/A ratio:  0.88 Serafina Royals MD Electronically signed by Serafina Royals MD Signature Date/Time: 09/20/2021/12:07:16 PM    Final                LOS: 2 days   Liandra Mendia  [facility]   Pager on [email or web address]. If 7PM-7AM, please contact night-coverage at [email or web address]     09/21/2021, 10:46 AM

## 2021-09-21 NOTE — Progress Notes (Addendum)
[facility]  ROUNDING NOTE   Subjective:   Julie [name] is a 75 year old female with past medical history of chronic diastolic heart failure, chronic respiratory failure on 2 L oxygen at home, diabetes, hypertension, dyslipidemia, osteoporosis, and CKD stage IIIb.  Patient presents to the emergency room with altered mental status and worsening shortness of breath.  She will be admitted for Elevated liver function tests [R79.89] NSTEMI (non-ST elevated myocardial infarction) (HCC) [I21.4] AKI (acute kidney injury) ([place]) [N17.9]  Patient is known to our clinic and sees Dr. Holley Raring.  Last appointment with Dr. Holley Raring was on 08/15/2021.    Patient sitting up in bed, competed breakfast tray at bedside  States she feels better Denies shortness of breath, remains on 4L [place] Sodium 128 Creatinine improved to 3.95  Objective:  Vital signs in last 24 hours:  Temp:  [97.4 F (36.3 C)-98 F (36.7 C)] 98 F (36.7 C) (12/08 1104) Pulse Rate:  [60-73] 61 (12/08 1104) Resp:  [16-24] 20 (12/07 2106) BP: (100-144)/(45-87) 100/45 (12/08 1104) SpO2:  [91 %-97 %] 91 % (12/08 1104)  Weight change:  Filed Weights   09/19/21 1849  Weight: 136.1 kg    Intake/Output: I/O last 3 completed shifts: In: 638.2 [I.V.:322.4; IV Piggyback:315.9] Out: 550 [Urine:550]   Intake/Output this shift:  Total I/O In: 591.9 [P.O.:240; IV Piggyback:351.9] Out: 700 [Urine:700]  Physical Exam: General: NAD, resting in bed  Head: Normocephalic, atraumatic. Moist oral mucosal membranes  Eyes: Anicteric  Lungs:  wheeze throughout, normal effort, O2 4 L  Heart: Regular rate and rhythm  Abdomen:  Soft, nontender, nondistended  Extremities:  ++ peripheral edema.  Neurologic: Nonfocal, moving all four extremities  Skin: No lesions       Basic Metabolic Panel: Recent Labs  Lab 09/16/21 1737 09/19/21 1859 09/20/21 0451 09/21/21 0452  NA 137 134* 134* 128*  K 4.2 5.7* 5.1 4.8  CL 100 97* 100  97*  CO2 29 24 24 23   GLUCOSE 164* 182* 143* 125*  BUN 42* 77* 82* 79*  CREATININE 1.86* 4.68* 4.41* 3.95*  CALCIUM 8.8* 8.8* 8.4* 7.7*     Liver Function Tests: Recent Labs  Lab 09/16/21 1737 09/19/21 1859 09/20/21 0451 09/21/21 0452  AST 18 874* 485* 173*  ALT 19 1,399* 1,046* 706*  ALKPHOS 69 77 65 59  BILITOT 0.9 1.1 0.9 0.5  PROT 6.7 6.7 5.9* 5.5*  ALBUMIN 3.6 3.6 3.1* 2.8*    No results for input(s): LIPASE, AMYLASE in the last 168 hours. No results for input(s): AMMONIA in the last 168 hours.  CBC: Recent Labs  Lab 09/16/21 1737 09/19/21 1859 09/20/21 0451 09/21/21 0452  WBC 5.6 11.1* 9.8 6.5  NEUTROABS 4.4 9.7*  --   --   HGB 10.2* 10.6* 9.7* 9.0*  HCT 33.9* 34.9* 31.5* 29.5*  MCV 82.5 82.1 81.4 79.1*  PLT 170 145* 143* 128*     Cardiac Enzymes: Recent Labs  Lab 09/19/21 1859  CKTOTAL 487*     BNP: Invalid input(s): POCBNP  CBG: Recent Labs  Lab 09/20/21 1131 09/20/21 1757 09/20/21 2116 09/21/21 0736 09/21/21 1106  GLUCAP 120* 141* 192* 117* 249*     Microbiology: Results for orders placed or performed during the hospital encounter of 09/19/21  Resp Panel by RT-PCR (Flu A&B, Covid) Nasopharyngeal Swab     Status: None   Collection Time: 09/19/21  6:59 PM   Specimen: Nasopharyngeal Swab; Nasopharyngeal(NP) swabs in vial transport medium  Result Value Ref Range Status  SARS Coronavirus 2 by RT PCR NEGATIVE NEGATIVE Final    Comment: (NOTE) SARS-CoV-2 target nucleic acids are NOT DETECTED.  The SARS-CoV-2 RNA is generally detectable in upper respiratory specimens during the acute phase of infection. The lowest concentration of SARS-CoV-2 viral copies this assay can detect is 138 copies/mL. A negative result does not preclude SARS-Cov-2 infection and should not be used as the sole basis for treatment or other patient management decisions. A negative result may occur with  improper specimen collection/handling, submission of  specimen other than nasopharyngeal swab, presence of viral mutation(s) within the areas targeted by this assay, and inadequate number of viral copies(<138 copies/mL). A negative result must be combined with clinical observations, patient history, and epidemiological information. The expected result is Negative.  Fact Sheet for Patients:  EntrepreneurPulse.com.au  Fact Sheet for Healthcare Providers:  IncredibleEmployment.be  This test is no t yet approved or cleared by the [place] FDA and  has been authorized for detection and/or diagnosis of SARS-CoV-2 by FDA under an Emergency Use Authorization (EUA). This EUA will remain  in effect (meaning this test can be used) for the duration of the COVID-19 declaration under Section 564(b)(1) of the Act, 21 U.S.C.section 360bbb-3(b)(1), unless the authorization is terminated  or revoked sooner.       Influenza A by PCR NEGATIVE NEGATIVE Final   Influenza B by PCR NEGATIVE NEGATIVE Final    Comment: (NOTE) The Xpert Xpress SARS-CoV-2/FLU/RSV plus assay is intended as an aid in the diagnosis of influenza from Nasopharyngeal swab specimens and should not be used as a sole basis for treatment. Nasal washings and aspirates are unacceptable for Xpert Xpress SARS-CoV-2/FLU/RSV testing.  Fact Sheet for Patients: EntrepreneurPulse.com.au  Fact Sheet for Healthcare Providers: IncredibleEmployment.be  This test is not yet approved or cleared by the [place] FDA and has been authorized for detection and/or diagnosis of SARS-CoV-2 by FDA under an Emergency Use Authorization (EUA). This EUA will remain in effect (meaning this test can be used) for the duration of the COVID-19 declaration under Section 564(b)(1) of the Act, 21 U.S.C. section 360bbb-3(b)(1), unless the authorization is terminated or revoked.  Performed at [facility], [address] [place], [place] [address]   Blood culture (routine x 2)     Status: None (Preliminary result)   Collection Time: 09/19/21  8:43 PM   Specimen: BLOOD  Result Value Ref Range Status   Specimen Description BLOOD RAC  Final   Special Requests   Final    BOTTLES DRAWN AEROBIC AND ANAEROBIC Blood Culture adequate volume   Culture   Final    NO GROWTH 2 DAYS Performed at [facility], [address] [place], [place] [address]    Report Status PENDING  Incomplete  Blood culture (routine x 2)     Status: Abnormal (Preliminary result)   Collection Time: 09/19/21  8:43 PM   Specimen: BLOOD  Result Value Ref Range Status   Specimen Description   Final    BLOOD LAC Performed at [facility], [address] [place], [place] [address]    Special Requests   Final    BOTTLES DRAWN AEROBIC AND ANAEROBIC Blood Culture adequate volume Performed at [facility], [address] [place], [place] [address]    Culture  Setup Time   Final    Organism ID to follow GRAM POSITIVE COCCI IN BOTH AEROBIC AND ANAEROBIC BOTTLES CRITICAL RESULT CALLED TO, READ BACK BY AND VERIFIED WITH: SUSAN  WATSON 1506 09/20/21 MU Performed at [facility], [address] [place], [place] [address]    Culture (A)  Final    STAPHYLOCOCCUS LUGDUNENSIS CULTURE REINCUBATED FOR BETTER GROWTH Performed at [facility], [place] [address] [place], [place] [address]    Report Status PENDING  Incomplete  Blood Culture ID Panel (Reflexed)     Status: Abnormal   Collection Time: 09/19/21  8:43 PM  Result Value Ref Range Status   Enterococcus faecalis NOT DETECTED NOT DETECTED Final   Enterococcus Faecium NOT DETECTED NOT DETECTED Final   Listeria monocytogenes NOT DETECTED NOT DETECTED Final   Staphylococcus species DETECTED (A) NOT DETECTED Final    Comment: CRITICAL RESULT CALLED TO, READ BACK BY AND VERIFIED WITH: SUSAN WATSON 1506 09/20/21 MU    Staphylococcus aureus (BCID) NOT  DETECTED NOT DETECTED Final   Staphylococcus epidermidis DETECTED (A) NOT DETECTED Final    Comment: Methicillin (oxacillin) resistant coagulase negative staphylococcus. Possible blood culture contaminant (unless isolated from more than one blood culture draw or clinical case suggests pathogenicity). No antibiotic treatment is indicated for blood  culture contaminants. CRITICAL RESULT CALLED TO, READ BACK BY AND VERIFIED WITH: SUSAN WATSON 1506 09/20/21 MU    Staphylococcus lugdunensis DETECTED (A) NOT DETECTED Final    Comment: Methicillin (oxacillin) resistant coagulase negative staphylococcus. Possible blood culture contaminant (unless isolated from more than one blood culture draw or clinical case suggests pathogenicity). No antibiotic treatment is indicated for blood  culture contaminants. CRITICAL RESULT CALLED TO, READ BACK BY AND VERIFIED WITH: [place] 2952 09/20/21 MU    Streptococcus species NOT DETECTED NOT DETECTED Final   Streptococcus agalactiae NOT DETECTED NOT DETECTED Final   Streptococcus pneumoniae NOT DETECTED NOT DETECTED Final   Streptococcus pyogenes NOT DETECTED NOT DETECTED Final   A.calcoaceticus-baumannii NOT DETECTED NOT DETECTED Final   Bacteroides fragilis NOT DETECTED NOT DETECTED Final   Enterobacterales NOT DETECTED NOT DETECTED Final   Enterobacter cloacae complex NOT DETECTED NOT DETECTED Final   Escherichia coli NOT DETECTED NOT DETECTED Final   Klebsiella aerogenes NOT DETECTED NOT DETECTED Final   Klebsiella oxytoca NOT DETECTED NOT DETECTED Final   Klebsiella pneumoniae NOT DETECTED NOT DETECTED Final   Proteus species NOT DETECTED NOT DETECTED Final   Salmonella species NOT DETECTED NOT DETECTED Final   Serratia marcescens NOT DETECTED NOT DETECTED Final   Haemophilus influenzae NOT DETECTED NOT DETECTED Final   Neisseria meningitidis NOT DETECTED NOT DETECTED Final   Pseudomonas aeruginosa NOT DETECTED NOT DETECTED Final   Stenotrophomonas  maltophilia NOT DETECTED NOT DETECTED Final   Candida albicans NOT DETECTED NOT DETECTED Final   Candida auris NOT DETECTED NOT DETECTED Final   Candida glabrata NOT DETECTED NOT DETECTED Final   Candida krusei NOT DETECTED NOT DETECTED Final   Candida parapsilosis NOT DETECTED NOT DETECTED Final   Candida tropicalis NOT DETECTED NOT DETECTED Final   Cryptococcus neoformans/gattii NOT DETECTED NOT DETECTED Final   Methicillin resistance mecA/C DETECTED (A) NOT DETECTED Final    Comment: CRITICAL RESULT CALLED TO, READ BACK BY AND VERIFIED WITH: SUSAN WATSON 1506 09/20/21 MU Performed at [facility], [address] [place], [place] [address]     Coagulation Studies: Recent Labs    09/19/21 1859  LABPROT 15.9*  INR 1.3*     Urinalysis: Recent Labs    09/19/21 2245  COLORURINE YELLOW  LABSPEC >1.030*  PHURINE 5.0  GLUCOSEU NEGATIVE  HGBUR NEGATIVE  BILIRUBINUR MODERATE*  KETONESUR TRACE*  PROTEINUR 100*  NITRITE NEGATIVE  LEUKOCYTESUR NEGATIVE       Imaging: CT HEAD WO CONTRAST (5MM)  Result Date: 09/19/2021 CLINICAL DATA:  Altered mental status, initial encounter EXAM: CT HEAD WITHOUT CONTRAST CT CERVICAL SPINE WITHOUT CONTRAST TECHNIQUE: Multidetector CT imaging of the head and cervical spine was performed following the standard protocol without intravenous contrast. Multiplanar CT image reconstructions of the cervical spine were also generated. COMPARISON:  07/03/2018 FINDINGS: CT HEAD FINDINGS Brain: No evidence of acute infarction, hemorrhage, hydrocephalus, extra-axial collection or mass lesion/mass effect. Vascular: No hyperdense vessel or unexpected calcification. Skull: Normal. Negative for fracture or focal lesion. Sinuses/Orbits: No acute finding. Other: None. CT CERVICAL SPINE FINDINGS Alignment: Loss of normal cervical lordosis is noted likely related to muscular spasm. Skull base and vertebrae: 7 cervical segments are well visualized. Vertebral body  height is well maintained. No acute fracture or acute facet abnormality is noted. The odontoid is within normal limits. Soft tissues and spinal canal: Surrounding soft tissue structures show vascular calcifications. No focal hematoma is noted. Upper chest: Visualized lung apices are within normal limits. Other: None IMPRESSION: CT of the head: No acute intracranial abnormality noted. CT of cervical spine: Mild loss of the normal cervical lordosis likely related to muscular spasm. No acute bony abnormality is noted. Electronically Signed   By: Inez Catalina M.D.   On: 09/19/2021 19:50   CT Chest Wo Contrast  Result Date: 09/19/2021 CLINICAL DATA:  Persistent cough. EXAM: CT CHEST WITHOUT CONTRAST TECHNIQUE: Multidetector CT imaging of the chest was performed following the standard protocol without IV contrast. COMPARISON:  Chest radiograph dated 09/19/2021 and CT dated 09/16/2021. FINDINGS: Evaluation of this exam is limited in the absence of intravenous contrast as well as due to respiratory motion. Cardiovascular: Mild cardiomegaly. No pericardial effusion. Advanced 3 vessel coronary vascular calcification. Mild atherosclerotic calcification of the thoracic aorta. No aneurysmal dilatation. The central pulmonary arteries are grossly unremarkable. Mediastinum/Nodes: No hilar or mediastinal adenopathy. The esophagus is grossly unremarkable. No mediastinal fluid collection. Lungs/Pleura: There is eventration of the left hemidiaphragm. There is partial consolidative changes of the left lower lobe which may represent atelectasis or pneumonia. Right lung base subsegmental densities may represent atelectasis. There is diffuse mosaic attenuation of the lung with areas of air trapping which may represent a underlying small airways versus small vessel disease. No pleural effusion or pneumothorax. The central airways are patent. Upper Abdomen: Multiple gallstones. Musculoskeletal: Degenerative changes of the spine. No acute  osseous pathology the IMPRESSION: 1. Partial consolidative changes of the left lower lobe may represent atelectasis or pneumonia. 2. Mosaic attenuation of the lung parenchyma with areas of air trapping likely representing underlying small airways versus small vessel disease. 3. Cholelithiasis. 4. Aortic Atherosclerosis (ICD10-I70.0). Electronically Signed   By: Anner Crete M.D.   On: 09/19/2021 19:57   CT Cervical Spine Wo Contrast  Result Date: 09/19/2021 CLINICAL DATA:  Altered mental status, initial encounter EXAM: CT HEAD WITHOUT CONTRAST CT CERVICAL SPINE WITHOUT CONTRAST TECHNIQUE: Multidetector CT imaging of the head and cervical spine was performed following the standard protocol without intravenous contrast. Multiplanar CT image reconstructions of the cervical spine were also generated. COMPARISON:  07/03/2018 FINDINGS: CT HEAD FINDINGS Brain: No evidence of acute infarction, hemorrhage, hydrocephalus, extra-axial collection or mass lesion/mass effect. Vascular: No hyperdense vessel or unexpected calcification. Skull: Normal. Negative for fracture or focal lesion. Sinuses/Orbits: No acute finding. Other: None. CT CERVICAL SPINE FINDINGS Alignment: Loss of normal cervical lordosis is noted likely related  to muscular spasm. Skull base and vertebrae: 7 cervical segments are well visualized. Vertebral body height is well maintained. No acute fracture or acute facet abnormality is noted. The odontoid is within normal limits. Soft tissues and spinal canal: Surrounding soft tissue structures show vascular calcifications. No focal hematoma is noted. Upper chest: Visualized lung apices are within normal limits. Other: None IMPRESSION: CT of the head: No acute intracranial abnormality noted. CT of cervical spine: Mild loss of the normal cervical lordosis likely related to muscular spasm. No acute bony abnormality is noted. Electronically Signed   By: Inez Catalina M.D.   On: 09/19/2021 19:50   US  RENAL  Result Date: 09/20/2021 CLINICAL DATA:  Acute kidney insufficiency. EXAM: RENAL / URINARY TRACT ULTRASOUND COMPLETE COMPARISON:  None. FINDINGS: Right Kidney: Renal measurements: 11.8 x 6.3 x 5.3 cm = volume: 203 mL. Echogenicity within normal limits. No mass or hydronephrosis visualized. Minimal perinephric free fluid is noted. Left Kidney: Renal measurements: 12.5 x 5.6 x 5.9 cm = volume: 216 mL. Echogenicity within normal limits. No mass or hydronephrosis visualized. Bladder: Appears normal for degree of bladder distention. Other: None. IMPRESSION: Unremarkable bilateral renal ultrasound Electronically Signed   By: San Morelle M.D.   On: 09/20/2021 15:27   DG Chest Portable 1 View  Result Date: 09/19/2021 CLINICAL DATA:  Shortness of breath EXAM: PORTABLE CHEST 1 VIEW COMPARISON:  07/24/2021 FINDINGS: Low lung volumes. Cardiomegaly with central bronchovascular crowding. Bilateral bronchitic changes without focal opacity, pleural effusion or pneumothorax. IMPRESSION: Cardiomegaly with low lung volumes causing central bronchovascular crowding. Bronchitic changes without definitive focal pulmonary opacity. Electronically Signed   By: Donavan Foil M.D.   On: 09/19/2021 19:06   ECHOCARDIOGRAM COMPLETE  Result Date: 09/20/2021    ECHOCARDIOGRAM REPORT   Patient Name:   Julie [name] Date of Exam: 09/20/2021 Medical Rec #:  [id number]         Height:       65.0 in Accession #:    [id number]        Weight:       300.0 lb Date of Birth:  [birth year]         BSA:          2.350 m Patient Age:    40 years          BP:           116/52 mmHg Patient Gender: F                 HR:           64 bpm. Exam Location:  [facility] Procedure: 2D Echo, Color Doppler, Cardiac Doppler and Intracardiac            Opacification Agent Indications:     I21.4 NSTEMI  History:         Patient has no prior history of Echocardiogram examinations.                  CHF, CKD; Risk Factors:Hypertension, Dyslipidemia and  Diabetes.  Sonographer:     Charmayne Sheer Referring Phys:  [id number] [place] Diagnosing Phys: Serafina Royals MD  Sonographer Comments: Technically difficult study due to poor echo windows. Image acquisition challenging due to patient body habitus. IMPRESSIONS  1. Left ventricular ejection fraction, by estimation, is 65 to 70%. The left ventricle has normal function. The left ventricle has no regional wall motion abnormalities. Left ventricular diastolic parameters were normal.  2. Right ventricular systolic  function is normal. The right ventricular size is normal.  3. The mitral valve is normal in structure. Mild mitral valve regurgitation.  4. The aortic valve is normal in structure. Aortic valve regurgitation is not visualized. FINDINGS  Left Ventricle: Left ventricular ejection fraction, by estimation, is 65 to 70%. The left ventricle has normal function. The left ventricle has no regional wall motion abnormalities. Definity contrast agent was given IV to delineate the left ventricular  endocardial borders. The left ventricular internal cavity size was small. There is no left ventricular hypertrophy. Left ventricular diastolic parameters were normal. Right Ventricle: The right ventricular size is normal. No increase in right ventricular wall thickness. Right ventricular systolic function is normal. Left Atrium: Left atrial size was normal in size. Right Atrium: Right atrial size was normal in size. Pericardium: There is no evidence of pericardial effusion. Mitral Valve: The mitral valve is normal in structure. Mild mitral valve regurgitation. MV peak gradient, 6.0 mmHg. The mean mitral valve gradient is 2.0 mmHg. Tricuspid Valve: The tricuspid valve is normal in structure. Tricuspid valve regurgitation is trivial. Aortic Valve: The aortic valve is normal in structure. Aortic valve regurgitation is not visualized. Aortic valve mean gradient measures 8.0 mmHg. Aortic valve peak gradient measures 12.5 mmHg. Aortic  valve area, by VTI measures 1.55 cm. Pulmonic Valve: The pulmonic valve was normal in structure. Pulmonic valve regurgitation is not visualized. Aorta: The aortic root and ascending aorta are structurally normal, with no evidence of dilitation. IAS/Shunts: No atrial level shunt detected by color flow Doppler.  LEFT VENTRICLE PLAX 2D LVIDd:         4.55 cm   Diastology LVIDs:         3.38 cm   LV e' medial:    9.79 cm/s LV PW:         1.27 cm   LV E/e' medial:  10.3 LV IVS:        0.81 cm   LV e' lateral:   7.29 cm/s LVOT diam:     1.90 cm   LV E/e' lateral: 13.9 LV SV:         55 LV SV Index:   23 LVOT Area:     2.84 cm  RIGHT VENTRICLE RV Basal diam:  4.94 cm RV Mid diam:    5.31 cm LEFT ATRIUM             Index        RIGHT ATRIUM           Index LA diam:        4.30 cm 1.83 cm/m   RA Area:     23.70 cm LA Vol (A2C):   72.9 ml 31.02 ml/m  RA Volume:   78.10 ml  33.23 ml/m LA Vol (A4C):   65.1 ml 27.70 ml/m LA Biplane Vol: 70.7 ml 30.08 ml/m  AORTIC VALVE                     PULMONIC VALVE AV Area (Vmax):    1.47 cm      PV Vmax:       0.81 m/s AV Area (Vmean):   1.38 cm      PV Vmean:      60.700 cm/s AV Area (VTI):     1.55 cm      PV VTI:        0.176 m AV Vmax:           177.00  cm/s   PV Peak grad:  2.6 mmHg AV Vmean:          130.000 cm/s  PV Mean grad:  2.0 mmHg AV VTI:            0.355 m AV Peak Grad:      12.5 mmHg AV Mean Grad:      8.0 mmHg LVOT Vmax:         92.00 cm/s LVOT Vmean:        63.400 cm/s LVOT VTI:          0.194 m LVOT/AV VTI ratio: 0.55  AORTA Ao Root diam: 2.80 cm MITRAL VALVE MV Area (PHT): 2.48 cm     SHUNTS MV Area VTI:   1.41 cm     Systemic VTI:  0.19 m MV Peak grad:  6.0 mmHg     Systemic Diam: 1.90 cm MV Mean grad:  2.0 mmHg MV Vmax:       1.22 m/s MV Vmean:      69.2 cm/s MV Decel Time: 306 msec MV E velocity: 101.00 cm/s MV A velocity: 115.00 cm/s MV E/A ratio:  0.88 Serafina Royals MD Electronically signed by Serafina Royals MD Signature Date/Time: 09/20/2021/12:07:16 PM     Final      Medications:    azithromycin 500 mg (09/20/21 2204)   cefTRIAXone (ROCEPHIN)  IV 2 g (09/20/21 2308)    amLODipine  10 mg Oral Daily   carvedilol  12.5 mg Oral BID WC   Chlorhexidine Gluconate Cloth  6 each Topical Daily   cholecalciferol  1,000 Units Oral Daily   furosemide  60 mg Intravenous BID   insulin aspart  0-9 Units Subcutaneous TID PC & HS   multivitamin with minerals  1 tablet Oral Daily   omega-3 acid ethyl esters  1 g Oral Daily   acetaminophen **OR** acetaminophen, magnesium hydroxide, morphine injection, nitroGLYCERIN, ondansetron, traZODone  Assessment/ Plan:  Ms. Julie [name] is a 75 y.o.  female with past medical history of chronic diastolic heart failure, chronic respiratory failure on 2 L oxygen at home, diabetes, hypertension, dyslipidemia, osteoporosis, and CKD stage IIIb.  Patient presents to the emergency room with altered mental status and worsening shortness of breath.  She will be admitted for Elevated liver function tests [R79.89] NSTEMI (non-ST elevated myocardial infarction) ([place]) [I21.4] AKI (acute kidney injury) ([place]) [N17.9]  Acute Kidney Injury on chronic kidney disease stage 3b with baseline creatinine 1.74 and GFR of 31 on 08/15/21.  Acute kidney injury likely secondary to cardiorenal syndrome Lisinopril held.  No IV contrast exposure. Renal ultrasound ordered to evaluate obstruction.   Creatinine improved. Edema improved. Continue Furosemide IV with fluid restriction.  Lab Results  Component Value Date   CREATININE 3.95 (H) 09/21/2021   CREATININE 4.41 (H) 09/20/2021   CREATININE 4.68 (H) 09/19/2021    Intake/Output Summary (Last 24 hours) at 09/21/2021 1333 Last data filed at 09/21/2021 1100 Gross per 24 hour  Intake 914.24 ml  Output 1250 ml  Net -335.76 ml    2. Anemia of chronic kidney disease: Normocytic Lab Results  Component Value Date   HGB 9.0 (L) 09/21/2021    Hemoglobin stable  3. Secondary  Hyperparathyroidism: with outpatient labs: PTH 84, phosphorus 4.6, calcium 8.9 on 08/15/21.   Lab Results  Component Value Date   CALCIUM 7.7 (L) 09/21/2021  Will monitor bone mineral metabolism during this admission.    4.  Hypertension with chronic kidney disease.  Home regimen  includes amlodipine, carvedilol, furosemide, and lisinopril.  Lisinopril currently on hold.  Blood pressure stable  5. Diabetes mellitus type II with chronic kidney disease noninsulin dependent. Home regimen includes glipizide. Most recent hemoglobin A1c is 5.4 on 07/24/21.  Glucose stable      LOS: 2   12/8/20221:33 PM

## 2021-09-21 NOTE — Evaluation (Addendum)
Physical Therapy Evaluation Patient Details Name: Julie [name] MRN: [id number] DOB: [birth year] Today's Date: 09/21/2021  History of Present Illness  Pt is a 75 y/o F with PMH: dCHF, stage IIIb CKD, chronic hypoxemic respiratory failure on 2 L/min oxygen at home, type II DM, hypertension, dyslipidemia, and osteoporosis. She presented to ED d/t AMS and SOB. Pt adm for acute on chronic diastolic CHF, acute on chronic hypoxemic respiratory failure and acute metabolic encephalopathy.  Elevated troponins were attributed to demand ischemia per cardiologist.   Clinical Impression  Patient received in bed, husband at bedside. She is agreeable to PT/OT assessment. She wishes to get up and brush teeth. Patient requires min assist with bed mobility. Reports she sleeps in recliner at home. Transfers with min +2 assist. Ambulated with RW and min guard in room. Slow pace and fatigued with mobility.  Patient is requiring higher level of O2 with mobility at this time as well. She will continue to benefit from skilled PT while here to improve strength, activity tolerance, and education regarding safe use of RW.          Recommendations for follow up therapy are one component of a multi-disciplinary discharge planning process, led by the attending physician.  Recommendations may be updated based on patient status, additional functional criteria and insurance authorization.  Follow Up Recommendations Home health PT    Assistance Recommended at Discharge Frequent or constant Supervision/Assistance  Functional Status Assessment Patient has had a recent decline in their functional status and demonstrates the ability to make significant improvements in function in a reasonable and predictable amount of time.  Equipment Recommendations  Rolling walker (2 wheels) (Bariatric)   Recommendations for Other Services       Precautions / Restrictions Precautions Precautions: Fall Restrictions Weight Bearing  Restrictions: No      Mobility  Bed Mobility Overal bed mobility: Needs Assistance Bed Mobility: Supine to Sit;Sit to Supine     Supine to sit: Min assist Sit to supine: Min assist   General bed mobility comments: MIN A for sup to sit to manage trunk, MIN A for sit to sup to manage LEs.    Transfers Overall transfer level: Needs assistance Equipment used: Rolling walker (2 wheels) Transfers: Sit to/from Stand Sit to Stand: Min assist;+2 physical assistance;From elevated surface           General transfer comment: MIN A +2 to boost up and stabilize including posterior support to extend hips to full stand. Cues to use walker safely as pt typically uses cane at baseline    Ambulation/Gait Ambulation/Gait assistance: Min guard Gait Distance (Feet): 25 Feet Assistive device: Rolling walker (2 wheels) Gait Pattern/deviations: Step-through pattern;Decreased step length - right;Decreased step length - left;Decreased stride length Gait velocity: decr     General Gait Details: patient requires min assist to manage walker at times, min guard for safety with ambulation. Performed mobility on 2 liters O2 with sats at 90%. Upon returning to bed O2 sats at 87%, bumped O2 back up to 4 liters and patient at 92%.  Stairs            Wheelchair Mobility    Modified Rankin (Stroke Patients Only)       Balance Overall balance assessment: Needs assistance Sitting-balance support: Feet supported Sitting balance-Leahy Scale: Good     Standing balance support: Bilateral upper extremity supported;During functional activity;Reliant on assistive device for balance Standing balance-Leahy Scale: Fair Standing balance comment: B UE support on RW is more  beneficial than pt's typical unilateral support.  Leaning on B forearms on sink while brushing teeth.                             Pertinent Vitals/Pain Pain Assessment: Faces Faces Pain Scale: Hurts a little bit Pain  Location: back Pain Descriptors / Indicators: Discomfort;Sore Pain Intervention(s): Monitored during session;Limited activity within patient's tolerance;Repositioned    Home Living Family/patient expects to be discharged to:: Private residence Living Arrangements: Spouse/significant other Available Help at Discharge: Family;Available PRN/intermittently Type of Home: House Home Access: Stairs to enter Entrance Stairs-Rails: Right;Left;Can reach both Entrance Stairs-Number of Steps: 5   Home Layout: One level Home Equipment: Cane - single point      Prior Function Prior Level of Function : Independent/Modified Independent;Driving             Mobility Comments: Pt MOD I for fxl mobility primarily with SPC at baseline ADLs Comments: Still driving and running her own errands, able to do some housework including light cleaning. She states that her grandson is preparing to assist her and her spouse more.     Hand Dominance        Extremity/Trunk Assessment   Upper Extremity Assessment Upper Extremity Assessment: Defer to OT evaluation    Lower Extremity Assessment Lower Extremity Assessment: Generalized weakness    Cervical / Trunk Assessment Cervical / Trunk Assessment: Normal  Communication   Communication: No difficulties  Cognition Arousal/Alertness: Awake/alert Behavior During Therapy: WFL for tasks assessed/performed Overall Cognitive Status: Within Functional Limits for tasks assessed                                          General Comments General comments (skin integrity, edema, etc.): Pt tolerates standing and mobilization on 2Lnc, sustaining sats >89%, however, requires being turned back up to 4Lnc when laying in bed as she de-sats to 86%.    Exercises Other Exercises Other Exercises: OT ed re: role   Assessment/Plan    PT Assessment Patient needs continued PT services  PT Problem List Decreased strength;Decreased mobility;Decreased  activity tolerance;Decreased balance;Decreased knowledge of use of DME;Cardiopulmonary status limiting activity;Obesity       PT Treatment Interventions DME instruction;Therapeutic activities;Gait training;Therapeutic exercise;Stair training;Functional mobility training;Patient/family education    PT Goals (Current goals can be found in the Care Plan section)  Acute Rehab PT Goals Patient Stated Goal: to get better and go home PT Goal Formulation: With patient Time For Goal Achievement: 10/05/21 Potential to Achieve Goals: Fair    Frequency Min 2X/week   Barriers to discharge Decreased caregiver support;Inaccessible home environment steps to enter, lives with husband    Co-evaluation PT/OT/SLP Co-Evaluation/Treatment: Yes Reason for Co-Treatment: For patient/therapist safety;To address functional/ADL transfers PT goals addressed during session: Mobility/safety with mobility;Balance;Proper use of DME OT goals addressed during session: ADL's and self-care       AM-PAC PT "6 Clicks" Mobility  Outcome Measure Help needed turning from your back to your side while in a flat bed without using bedrails?: A Lot Help needed moving from lying on your back to sitting on the side of a flat bed without using bedrails?: A Lot Help needed moving to and from a bed to a chair (including a wheelchair)?: A Little Help needed standing up from a chair using your arms (e.g., wheelchair or bedside chair)?:  A Little Help needed to walk in hospital room?: A Little Help needed climbing 3-5 steps with a railing? : A Lot 6 Click Score: 15    End of Session Equipment Utilized During Treatment: Gait belt;Oxygen Activity Tolerance: Patient limited by fatigue Patient left: in bed;with call bell/phone within reach;with bed alarm set;with family/visitor present Nurse Communication: Mobility status PT Visit Diagnosis: Unsteadiness on feet (R26.81);Muscle weakness (generalized) (M62.81);Other abnormalities of  gait and mobility (R26.89);Difficulty in walking, not elsewhere classified (R26.2)    Time: 9733-1250 PT Time Calculation (min) (ACUTE ONLY): 21 min   Charges:   PT Evaluation $PT Eval Moderate Complexity: 1 Mod          Karlena Luebke, PT, GCS 09/21/21,2:32 PM

## 2021-09-21 NOTE — Progress Notes (Signed)
PHARMACIST - PHYSICIAN COMMUNICATION DR:   Mal Misty CONCERNING: Antibiotic IV to Oral Route Change Policy  RECOMMENDATION: This patient is receiving Azithromycin by the intravenous route.  Based on criteria approved by the Pharmacy and Therapeutics Committee, the antibiotic(s) is/are being converted to the equivalent oral dose form(s).   DESCRIPTION: These criteria include: Patient being treated for a respiratory tract infection, urinary tract infection, cellulitis or clostridium difficile associated diarrhea if on metronidazole The patient is not neutropenic and does not exhibit a GI malabsorption state The patient is eating (either orally or via tube) and/or has been taking other orally administered medications for a least 24 hours The patient is improving clinically and has a Tmax < 100.5  If you have questions about this conversion, please contact the Pharmacy Department  []   [phone] )  [facility] [x]   [phone] )  [facility] []   [phone] )  [facility] []   [phone] )  [facility] []   [phone] )  [place] Rodriguez-Guzman PharmD, BCPS 09/21/2021 3:35 PM

## 2021-09-21 NOTE — Evaluation (Signed)
Occupational Therapy Evaluation Patient Details Name: Julie [name] MRN: [id number] DOB: 04/22/1946 Today's Date: 09/21/2021   History of Present Illness Pt is a 75 y/o F with PMH: dCHF, stage IIIb CKD, chronic hypoxemic respiratory failure on 2 L/min oxygen at home, type II DM, hypertension, dyslipidemia, and osteoporosis. She presented to ED d/t AMS and SOB. Pt adm for acute on chronic diastolic CHF, acute on chronic hypoxemic respiratory failure and acute metabolic encephalopathy.  Elevated troponins were attributed to demand ischemia per cardiologist.   Clinical Impression   Pt seen for OT evaluation this date in setting of acute hospitalization with respiratory failure. Pt reports being MOD I for fxl mobility at baseline and able to perform all basic self care and some IADLs including running errands and light housework. Lives with spouse and has supportive grandson who checks on pt and her spouse. Pt presents this date with decreased fxl activity tolerance and strength. She currently requires MIN A for ADL transfers with RW and MIN A for LB ADLs d/t SOB. Pt requires SUPV for static standing ADLs sink-side including oral care. She is engaged in fxl mobility in the room and tolerates being up on her feet for ~8-9 minutes. She is returned to bed end of session and is left with all needs met and in reach. Will continue to follow acutely. Anticipate pt will require HHOT f/u services to improve tolerance for ADLs in the natural environment.     Recommendations for follow up therapy are one component of a multi-disciplinary discharge planning process, led by the attending physician.  Recommendations may be updated based on patient status, additional functional criteria and insurance authorization.   Follow Up Recommendations  Home health OT    Assistance Recommended at Discharge Set up Supervision/Assistance  Functional Status Assessment  Patient has had a recent decline in their functional  status and demonstrates the ability to make significant improvements in function in a reasonable and predictable amount of time.  Equipment Recommendations  BSC/3in1;Other (comment) (bariatric BSC and bariatric front-wheel walker.)    Recommendations for Other Services       Precautions / Restrictions Precautions Precautions: Fall Restrictions Weight Bearing Restrictions: No      Mobility Bed Mobility Overal bed mobility: Needs Assistance Bed Mobility: Supine to Sit;Sit to Supine     Supine to sit: Min assist Sit to supine: Min assist   General bed mobility comments: MIN A for sup to sit to manage trunk, MIN A for sit to sup to manage LEs.    Transfers Overall transfer level: Needs assistance Equipment used: Rolling walker (2 wheels) Transfers: Sit to/from Stand Sit to Stand: Min assist;+2 safety/equipment           General transfer comment: MIN A +2 to stabilize including posterior support to extend hips to full stand. Cues to use walker safely as pt typically uses cane at baseline      Balance Overall balance assessment: Needs assistance   Sitting balance-Leahy Scale: Good       Standing balance-Leahy Scale: Fair Standing balance comment: B UE support on RW is more beneficial than pt's typical unilateral support.                           ADL either performed or assessed with clinical judgement   ADL Overall ADL's : Needs assistance/impaired  General ADL Comments: requires SETUP for seated UB ADLs, MIN A for seated LB ADLs including donning socks, Able to perform static standing self care at sink with SUPV. ADL transfers with RW with MIN A.     Vision Patient Visual Report: No change from baseline       Perception     Praxis      Pertinent Vitals/Pain Pain Assessment: No/denies pain     Hand Dominance     Extremity/Trunk Assessment Upper Extremity Assessment Upper Extremity  Assessment: Generalized weakness (ROM WFL for basic self care, limited strength against resistance, grossly 4-/5 throughout)   Lower Extremity Assessment Lower Extremity Assessment: Defer to PT evaluation;Generalized weakness       Communication Communication Communication: No difficulties   Cognition Arousal/Alertness: Awake/alert Behavior During Therapy: WFL for tasks assessed/performed Overall Cognitive Status: Within Functional Limits for tasks assessed                                       General Comments  Pt tolerates standing and mobilization on 2Lnc, sustaining sats >89%, however, requires being turned back up to 4Lnc when laying in bed as she de-sats to 86%.    Exercises Other Exercises Other Exercises: OT ed re: role   Shoulder Instructions      Home Living Family/patient expects to be discharged to:: Private residence Living Arrangements: Spouse/significant other Available Help at Discharge: Family;Available PRN/intermittently (grandson works, but helpful and supportive) Type of Home: House Home Access: Stairs to enter Technical brewer of Steps: 5 Entrance Stairs-Rails: Right;Left;Can reach both Home Layout: One level               Home Equipment: [place] - single point          Prior Functioning/Environment Prior Level of Function : Independent/Modified Independent;Driving             Mobility Comments: Pt MOD I for fxl mobility primarily with SPC at baseline ADLs Comments: Still driving and running her own errands, able to do some housework including light cleaning. She states that her grandson is preparing to assist her and her spouse more.        OT Problem List: Decreased strength;Decreased activity tolerance;Decreased knowledge of use of DME or AE;Cardiopulmonary status limiting activity      OT Treatment/Interventions: Self-care/ADL training;Therapeutic exercise;DME and/or AE instruction;Therapeutic  activities;Patient/family education;Balance training    OT Goals(Current goals can be found in the care plan section) Acute Rehab OT Goals Patient Stated Goal: to breathe better OT Goal Formulation: With patient Time For Goal Achievement: 10/05/21 Potential to Achieve Goals: Good ADL Goals Pt Will Perform Grooming: with modified independence;standing Pt Will Perform Upper Body Dressing: with modified independence;sitting Pt Will Perform Lower Body Dressing: with supervision;with set-up;with adaptive equipment;sit to/from stand Pt Will Transfer to Toilet: with supervision;ambulating;grab bars Pt Will Perform Toileting - Clothing Manipulation and hygiene: with supervision;sit to/from stand  OT Frequency: Min 2X/week   Barriers to D/C:            Co-evaluation PT/OT/SLP Co-Evaluation/Treatment: Yes Reason for Co-Treatment: Complexity of the patient's impairments (multi-system involvement);To address functional/ADL transfers PT goals addressed during session: Mobility/safety with mobility OT goals addressed during session: ADL's and self-care      AM-PAC OT "6 Clicks" Daily Activity     Outcome Measure Help from another person eating meals?: None Help from another person taking care of personal  grooming?: A Little Help from another person toileting, which includes using toliet, bedpan, or urinal?: A Lot Help from another person bathing (including washing, rinsing, drying)?: A Lot Help from another person to put on and taking off regular upper body clothing?: A Little Help from another person to put on and taking off regular lower body clothing?: A Lot 6 Click Score: 16   End of Session Equipment Utilized During Treatment: Rolling walker (2 wheels);Oxygen Nurse Communication: Mobility status  Activity Tolerance: Patient tolerated treatment well Patient left: in bed;with call bell/phone within reach;with bed alarm set;with family/visitor present  OT Visit Diagnosis: History of  falling (Z91.81);Unsteadiness on feet (R26.81)                Time: 7837-5423 OT Time Calculation (min): 21 min Charges:  OT General Charges $OT Visit: 1 Visit OT Evaluation $OT Eval Moderate Complexity: [place], [place], OTR/L ascom [phone] 09/21/21, 2:02 PM

## 2021-09-21 NOTE — Consult Note (Addendum)
   Heart Failure Nurse Navigator Note  HfpEF 65 to 70%.  Normal right ventricular systolic function.  Mild mitral regurgitation  She presented to the emergency room with complaints of worsening shortness of breath and altered mental status.  BNP on admission was 848.   Comorbidities:  Chronic kidney disease stage II Diabetes Hyperlipidemia Hypertension Morbid obesity Osteoporosis Lymphedema  Medications:  Amlodipine 10 mg daily Carvedilol 12 and half milligrams 2 times a day with meals Furosemide 60 mg IV 2 times a day  Labs:  Sodium 128, potassium 4.8, chloride 97, CO2 23, BUN 79, creatinine 3.95 down from 4.41 of yesterday  Weight documented today as 126.6, admission weight in the ED which unsure if this is accurate or stated was 136.1kg  Blood pressure 100/45  Initial meeting with the patient, also at the bedside was her husband whom she lives with and her grandson.  In talking with them the patient and her husband currently are living independently she does the cooking, she is trying to do a better job of using less salt.  Discussed removing the saltshaker from the table completely.  She had not been weighing herself daily but discussed the importance of daily weight and recording and reporting 2 to 3 pound weight gain overnight or 5 pounds within the week.  Also discussed the importance of fluid restriction taking in no more than 2 L / 64 ounces in a 24-hour period.  Grandson reports that when he was there on Sunday visiting during a period of an hour and a half she had taken in 60 ounces of water.  Recommended using a 2 L container filled with water and as she drinks throughout the day remove that amount from the 2 L bottle so she can see how much of a fluid restriction he has left.  Discussed diet, she does cook at home but she states at least 1 meal a day is eaten at a restaurant.  Discussed ways when eating at a restaurant to decrease sodium intake and also to add  servers if they know how much sodium is contained in the food that they were thinking about ordering.  Gave examples of the chicken salad from [employer] has 1750 mg of sodium.  They do not use canned goods or canned soups.  She has the doctor gourmet low-sodium cookbook.  Grandson also expressed that he wants his grandparents to come and live with him and his fiance so they can enjoy life and not have to worry about cooking and house chores.  He would like to see them just enjoy their great grandkids.  Offered the heart failure videos, they are interested but like would like to review those later this afternoon.  I told him that I would gladly come back and see that they get to view those.  She was made aware that she has an appointment in the outpatient heart failure clinic on December 29 at 11 AM.  She has a 0% history of no-show.  Were given the living with heart failure teaching booklet, zone magnet and information on low-sodium.  Pricilla Riffle RN CHFN

## 2021-09-21 NOTE — Plan of Care (Signed)
  Problem: Education: Goal: Knowledge of General Education information will improve Description: Including pain rating scale, medication(s)/side effects and non-pharmacologic comfort measures Outcome: Progressing   Problem: Health Behavior/Discharge Planning: Goal: Ability to manage health-related needs will improve Outcome: Progressing   Problem: Clinical Measurements: Goal: Respiratory complications will improve Outcome: Not Progressing  Pt continues on 4L, baseline is 2L at home.

## 2021-09-21 NOTE — Progress Notes (Signed)
Pt arrived to unit on bed from ED on 4L supplemental O2 via [place]. Grandsons present and supportive. Pt A/O x4 but forgetful. Foley in place. Pt able to move all extremities. LBM: 09/19/21. No skin issues noted. Pt presents with generalized edema to upper/lower extremities, non pitting. 2 patent PIVs present to bilateral AC, flushed and saline locked. Pt oriented to unit. Call light within reach, bed in low position, and safety measures intact.

## 2021-09-22 LAB — COMPREHENSIVE METABOLIC PANEL
ALT: 515 U/L — ABNORMAL HIGH (ref 0–44)
AST: 66 U/L — ABNORMAL HIGH (ref 15–41)
Albumin: 2.9 g/dL — ABNORMAL LOW (ref 3.5–5.0)
Alkaline Phosphatase: 57 U/L (ref 38–126)
Anion gap: 10 (ref 5–15)
BUN: 93 mg/dL — ABNORMAL HIGH (ref 8–23)
CO2: 26 mmol/L (ref 22–32)
Calcium: 8.1 mg/dL — ABNORMAL LOW (ref 8.9–10.3)
Chloride: 97 mmol/L — ABNORMAL LOW (ref 98–111)
Creatinine, Ser: 3.81 mg/dL — ABNORMAL HIGH (ref 0.44–1.00)
GFR, Estimated: 12 mL/min — ABNORMAL LOW (ref >60)
Glucose, Bld: 163 mg/dL — ABNORMAL HIGH (ref 70–99)
Potassium: 4.9 mmol/L (ref 3.5–5.1)
Sodium: 133 mmol/L — ABNORMAL LOW (ref 135–145)
Total Bilirubin: 0.7 mg/dL (ref 0.3–1.2)
Total Protein: 5.7 g/dL — ABNORMAL LOW (ref 6.5–8.1)

## 2021-09-22 LAB — GLUCOSE, CAPILLARY
Glucose-Capillary: 195 mg/dL — ABNORMAL HIGH (ref 70–99)
Glucose-Capillary: 176 mg/dL — ABNORMAL HIGH (ref 70–99)
Glucose-Capillary: 170 mg/dL — ABNORMAL HIGH (ref 70–99)
Glucose-Capillary: 171 mg/dL — ABNORMAL HIGH (ref 70–99)

## 2021-09-22 MED ORDER — CEFDINIR 300 MG PO CAPS
300.0000 mg | ORAL_CAPSULE | Freq: Every day | ORAL | Status: AC
  Administered 2021-09-22 – 2021-09-23 (×2): 300 mg via ORAL
  Filled 2021-09-22 (×3): qty 1

## 2021-09-22 NOTE — Care Management Important Message (Signed)
Important Message  Patient Details  Name: Julie [name] MRN: [id number] Date of Birth: 05/04/1946   Medicare Important Message Given:  Yes     Dannette Barbara 09/22/2021, 12:43 PM

## 2021-09-22 NOTE — Progress Notes (Signed)
Physical Therapy Treatment Patient Details Name: Julie [name] MRN: [id number] DOB: 05-09-1946 Today's Date: 09/22/2021   History of Present Illness Pt is a 75 y/o F with PMH: dCHF, stage IIIb CKD, chronic hypoxemic respiratory failure on 2 L/min oxygen at home, type II DM, hypertension, dyslipidemia, and osteoporosis. She presented to ED d/t AMS and SOB. Pt adm for acute on chronic diastolic CHF, acute on chronic hypoxemic respiratory failure and acute metabolic encephalopathy.  Elevated troponins were attributed to demand ischemia per cardiologist.    PT Comments    Pt seen for PT tx with pt endorsing fatigue, declining gait, but agreeable to get OOB. Pt requires extra time to complete supine>sit but no physical assistance with reliance on hospital bed features. Pt is unable to transfer sit.stand from low EOB 2/2 BLE & generalized weakness, requiring EOB to be elevated. Pt completes stand pivot with RW & CGA. Pt engages in BLE strengthening exercises while in recliner. Encouraged pt to sit up as much as possible & pt agreeable. Pt reports she plans to d/c to her grandson's house where he has 1 step to enter the home.     Recommendations for follow up therapy are one component of a multi-disciplinary discharge planning process, led by the attending physician.  Recommendations may be updated based on patient status, additional functional criteria and insurance authorization.  Follow Up Recommendations  Home health PT     Assistance Recommended at Discharge Frequent or constant Supervision/Assistance  Equipment Recommendations  Rolling walker (2 wheels) (bariatric)    Recommendations for Other Services       Precautions / Restrictions Precautions Precautions: Fall Restrictions Weight Bearing Restrictions: No     Mobility  Bed Mobility Overal bed mobility: Needs Assistance Bed Mobility: Supine to Sit     Supine to sit: Supervision;HOB elevated Sit to supine: Min assist    General bed mobility comments: extra time, use of bed rails, HOB elevated    Transfers Overall transfer level: Needs assistance Equipment used: Rolling walker (2 wheels) Transfers: Bed to chair/wheelchair/BSC;Sit to/from Stand Sit to Stand: Min assist Stand pivot transfers: Min assist         General transfer comment: Extra time, rocking motion/momentum to assist with sit>stand, requires EOB slightly elevated but able to stand from recliner with B armrests    Ambulation/Gait Ambulation/Gait assistance:  (pt declines at this time 2/2 fatigue)                 Stairs             Wheelchair Mobility    Modified Rankin (Stroke Patients Only)       Balance Overall balance assessment: Needs assistance Sitting-balance support: Feet supported Sitting balance-Leahy Scale: Good     Standing balance support: During functional activity;Bilateral upper extremity supported Standing balance-Leahy Scale: Fair Standing balance comment: support on RW                            Cognition Arousal/Alertness: Awake/alert Behavior During Therapy: WFL for tasks assessed/performed Overall Cognitive Status: Within Functional Limits for tasks assessed                                          Exercises General Exercises - Lower Extremity Long Arc Quad: AROM;Strengthening;10 reps;Seated Hip Flexion/Marching: AROM;Strengthening;Both;10 reps;Seated Other Exercises Other Exercises: Pt educated  re; OT role, DME recs, home routines/modifications Other Exercises: grooming, sit>sup, sit<>stand x3, sitting/standing balance/tolerance    General Comments General comments (skin integrity, edema, etc.): Pt on 4L/min via nasal cannula, SpO2 >/= 90%      Pertinent Vitals/Pain Pain Assessment: Faces Faces Pain Scale: Hurts a little bit Pain Location: back Pain Descriptors / Indicators: Discomfort;Sore Pain Intervention(s): Monitored during  session;Repositioned    Home Living                          Prior Function            PT Goals (current goals can now be found in the care plan section) Acute Rehab PT Goals Patient Stated Goal: to get better and go home PT Goal Formulation: With patient Time For Goal Achievement: 10/05/21 Potential to Achieve Goals: Fair Progress towards PT goals: Progressing toward goals    Frequency    Min 2X/week      PT Plan Current plan remains appropriate    Co-evaluation              AM-PAC PT "6 Clicks" Mobility   Outcome Measure  Help needed turning from your back to your side while in a flat bed without using bedrails?: A Little Help needed moving from lying on your back to sitting on the side of a flat bed without using bedrails?: A Lot Help needed moving to and from a bed to a chair (including a wheelchair)?: A Little Help needed standing up from a chair using your arms (e.g., wheelchair or bedside chair)?: A Little Help needed to walk in hospital room?: A Little Help needed climbing 3-5 steps with a railing? : A Lot 6 Click Score: 16    End of Session Equipment Utilized During Treatment: Gait belt;Oxygen Activity Tolerance: Patient limited by fatigue Patient left: in chair;with family/visitor present;with call bell/phone within reach   PT Visit Diagnosis: Unsteadiness on feet (R26.81);Muscle weakness (generalized) (M62.81);Other abnormalities of gait and mobility (R26.89);Difficulty in walking, not elsewhere classified (R26.2)     Time: 2703-5009 PT Time Calculation (min) (ACUTE ONLY): 12 min  Charges:  $Therapeutic Activity: 8-22 mins                     Lavone Nian, PT, DPT 09/22/21, 3:59 PM    Waunita Schooner 09/22/2021, 3:58 PM

## 2021-09-22 NOTE — Progress Notes (Addendum)
[facility]  ROUNDING NOTE   Subjective:   Julie [name] is a 75 year old female with past medical history of chronic diastolic heart failure, chronic respiratory failure on 2 L oxygen at home, diabetes, hypertension, dyslipidemia, osteoporosis, and CKD stage IIIb.  Patient presents to the emergency room with altered mental status and worsening shortness of breath.  She will be admitted for Elevated liver function tests [R79.89] NSTEMI (non-ST elevated myocardial infarction) (HCC) [I21.4] AKI (acute kidney injury) ([place]) [N17.9]  Patient is known to our clinic and sees Dr. Holley Raring.  Last appointment with Dr. Holley Raring was on 08/15/2021.    Patient seen sitting up in bed, family friend at bedside Alert and oriented States her breathing is at its baseline.  Continues to require 4 L nasal cannula Edema slightly improved  Urine output of 1.4 L recorded in past 24 hours.  Objective:  Vital signs in last 24 hours:  Temp:  [97.7 F (36.5 C)-98.1 F (36.7 C)] 97.7 F (36.5 C) (12/09 1135) Pulse Rate:  [58-67] 66 (12/09 1135) Resp:  [17-18] 17 (12/09 0423) BP: (109-121)/(51-59) 110/55 (12/09 1135) SpO2:  [92 %-97 %] 96 % (12/09 1135) Weight:  [126.6 kg-129 kg] 129 kg (12/09 0600)  Weight change:  Filed Weights   09/19/21 1849 09/21/21 1443 09/22/21 0600  Weight: 136.1 kg 126.6 kg 129 kg    Intake/Output: I/O last 3 completed shifts: In: 1071.9 [P.O.:720; IV Piggyback:351.9] Out: 1950 [Urine:1950]   Intake/Output this shift:  Total I/O In: 240 [P.O.:240] Out: -   Physical Exam: General: NAD, resting in bed  Head: Normocephalic, atraumatic. Moist oral mucosal membranes  Eyes: Anicteric  Lungs:  Mild wheeze throughout, normal effort, O2 4 L  Heart: Regular rate and rhythm  Abdomen:  Soft, nontender, nondistended  Extremities:  + peripheral edema.  Neurologic: Nonfocal, moving all four extremities  Skin: No lesions  GU Foley catheter    Basic Metabolic  Panel: Recent Labs  Lab 09/16/21 1737 09/19/21 1859 09/20/21 0451 09/21/21 0452 09/22/21 0559  NA 137 134* 134* 128* 133*  K 4.2 5.7* 5.1 4.8 4.9  CL 100 97* 100 97* 97*  CO2 29 24 24 23 26   GLUCOSE 164* 182* 143* 125* 163*  BUN 42* 77* 82* 79* 93*  CREATININE 1.86* 4.68* 4.41* 3.95* 3.81*  CALCIUM 8.8* 8.8* 8.4* 7.7* 8.1*     Liver Function Tests: Recent Labs  Lab 09/16/21 1737 09/19/21 1859 09/20/21 0451 09/21/21 0452 09/22/21 0559  AST 18 874* 485* 173* 66*  ALT 19 1,399* 1,046* 706* 515*  ALKPHOS 69 77 65 59 57  BILITOT 0.9 1.1 0.9 0.5 0.7  PROT 6.7 6.7 5.9* 5.5* 5.7*  ALBUMIN 3.6 3.6 3.1* 2.8* 2.9*    No results for input(s): LIPASE, AMYLASE in the last 168 hours. No results for input(s): AMMONIA in the last 168 hours.  CBC: Recent Labs  Lab 09/16/21 1737 09/19/21 1859 09/20/21 0451 09/21/21 0452  WBC 5.6 11.1* 9.8 6.5  NEUTROABS 4.4 9.7*  --   --   HGB 10.2* 10.6* 9.7* 9.0*  HCT 33.9* 34.9* 31.5* 29.5*  MCV 82.5 82.1 81.4 79.1*  PLT 170 145* 143* 128*     Cardiac Enzymes: Recent Labs  Lab 09/19/21 1859  CKTOTAL 487*     BNP: Invalid input(s): POCBNP  CBG: Recent Labs  Lab 09/21/21 1106 09/21/21 1614 09/21/21 2204 09/22/21 0924 09/22/21 1136  GLUCAP 249* 203* 209* 195* 176*     Microbiology: Results for orders placed or  performed during the hospital encounter of 09/19/21  Resp Panel by RT-PCR (Flu A&B, Covid) Nasopharyngeal Swab     Status: None   Collection Time: 09/19/21  6:59 PM   Specimen: Nasopharyngeal Swab; Nasopharyngeal(NP) swabs in vial transport medium  Result Value Ref Range Status   SARS Coronavirus 2 by RT PCR NEGATIVE NEGATIVE Final    Comment: (NOTE) SARS-CoV-2 target nucleic acids are NOT DETECTED.  The SARS-CoV-2 RNA is generally detectable in upper respiratory specimens during the acute phase of infection. The lowest concentration of SARS-CoV-2 viral copies this assay can detect is 138 copies/mL. A  negative result does not preclude SARS-Cov-2 infection and should not be used as the sole basis for treatment or other patient management decisions. A negative result may occur with  improper specimen collection/handling, submission of specimen other than nasopharyngeal swab, presence of viral mutation(s) within the areas targeted by this assay, and inadequate number of viral copies(<138 copies/mL). A negative result must be combined with clinical observations, patient history, and epidemiological information. The expected result is Negative.  Fact Sheet for Patients:  EntrepreneurPulse.com.au  Fact Sheet for Healthcare Providers:  IncredibleEmployment.be  This test is no t yet approved or cleared by the [place] FDA and  has been authorized for detection and/or diagnosis of SARS-CoV-2 by FDA under an Emergency Use Authorization (EUA). This EUA will remain  in effect (meaning this test can be used) for the duration of the COVID-19 declaration under Section 564(b)(1) of the Act, 21 U.S.C.section 360bbb-3(b)(1), unless the authorization is terminated  or revoked sooner.       Influenza A by PCR NEGATIVE NEGATIVE Final   Influenza B by PCR NEGATIVE NEGATIVE Final    Comment: (NOTE) The Xpert Xpress SARS-CoV-2/FLU/RSV plus assay is intended as an aid in the diagnosis of influenza from Nasopharyngeal swab specimens and should not be used as a sole basis for treatment. Nasal washings and aspirates are unacceptable for Xpert Xpress SARS-CoV-2/FLU/RSV testing.  Fact Sheet for Patients: EntrepreneurPulse.com.au  Fact Sheet for Healthcare Providers: IncredibleEmployment.be  This test is not yet approved or cleared by the [place] FDA and has been authorized for detection and/or diagnosis of SARS-CoV-2 by FDA under an Emergency Use Authorization (EUA). This EUA will remain in effect (meaning this test can  be used) for the duration of the COVID-19 declaration under Section 564(b)(1) of the Act, 21 U.S.C. section 360bbb-3(b)(1), unless the authorization is terminated or revoked.  Performed at [facility], [address] [place], [place] [address]   Blood culture (routine x 2)     Status: None (Preliminary result)   Collection Time: 09/19/21  8:43 PM   Specimen: BLOOD  Result Value Ref Range Status   Specimen Description BLOOD RAC  Final   Special Requests   Final    BOTTLES DRAWN AEROBIC AND ANAEROBIC Blood Culture adequate volume   Culture   Final    NO GROWTH 3 DAYS Performed at [facility], [address] [place], [place] [address]    Report Status PENDING  Incomplete  Blood culture (routine x 2)     Status: Abnormal (Preliminary result)   Collection Time: 09/19/21  8:43 PM   Specimen: BLOOD  Result Value Ref Range Status   Specimen Description   Final    BLOOD LAC Performed at [facility], [address] [place], [place] [address]    Special Requests   Final    BOTTLES DRAWN AEROBIC AND ANAEROBIC Blood Culture adequate  volume Performed at [facility], [address] [place], [place] [address]    Culture  Setup Time   Final    Organism ID to follow GRAM POSITIVE COCCI IN BOTH AEROBIC AND ANAEROBIC BOTTLES CRITICAL RESULT CALLED TO, READ BACK BY AND VERIFIED WITH: SUSAN WATSON 1506 09/20/21 MU Performed at [facility], [address] [place], [place] [address]    Culture (A)  Final    STAPHYLOCOCCUS LUGDUNENSIS STAPHYLOCOCCUS EPIDERMIDIS    Report Status PENDING  Incomplete  Blood Culture ID Panel (Reflexed)     Status: Abnormal   Collection Time: 09/19/21  8:43 PM  Result Value Ref Range Status   Enterococcus faecalis NOT DETECTED NOT DETECTED Final   Enterococcus Faecium NOT DETECTED NOT DETECTED Final   Listeria monocytogenes NOT DETECTED NOT DETECTED Final   Staphylococcus species DETECTED (A) NOT DETECTED  Final    Comment: CRITICAL RESULT CALLED TO, READ BACK BY AND VERIFIED WITH: SUSAN WATSON 1506 09/20/21 MU    Staphylococcus aureus (BCID) NOT DETECTED NOT DETECTED Final   Staphylococcus epidermidis DETECTED (A) NOT DETECTED Final    Comment: Methicillin (oxacillin) resistant coagulase negative staphylococcus. Possible blood culture contaminant (unless isolated from more than one blood culture draw or clinical case suggests pathogenicity). No antibiotic treatment is indicated for blood  culture contaminants. CRITICAL RESULT CALLED TO, READ BACK BY AND VERIFIED WITH: SUSAN WATSON 1506 09/20/21 MU    Staphylococcus lugdunensis DETECTED (A) NOT DETECTED Final    Comment: Methicillin (oxacillin) resistant coagulase negative staphylococcus. Possible blood culture contaminant (unless isolated from more than one blood culture draw or clinical case suggests pathogenicity). No antibiotic treatment is indicated for blood  culture contaminants. CRITICAL RESULT CALLED TO, READ BACK BY AND VERIFIED WITH: [place] 7062 09/20/21 MU    Streptococcus species NOT DETECTED NOT DETECTED Final   Streptococcus agalactiae NOT DETECTED NOT DETECTED Final   Streptococcus pneumoniae NOT DETECTED NOT DETECTED Final   Streptococcus pyogenes NOT DETECTED NOT DETECTED Final   A.calcoaceticus-baumannii NOT DETECTED NOT DETECTED Final   Bacteroides fragilis NOT DETECTED NOT DETECTED Final   Enterobacterales NOT DETECTED NOT DETECTED Final   Enterobacter cloacae complex NOT DETECTED NOT DETECTED Final   Escherichia coli NOT DETECTED NOT DETECTED Final   Klebsiella aerogenes NOT DETECTED NOT DETECTED Final   Klebsiella oxytoca NOT DETECTED NOT DETECTED Final   Klebsiella pneumoniae NOT DETECTED NOT DETECTED Final   Proteus species NOT DETECTED NOT DETECTED Final   Salmonella species NOT DETECTED NOT DETECTED Final   Serratia marcescens NOT DETECTED NOT DETECTED Final   Haemophilus influenzae NOT DETECTED NOT  DETECTED Final   Neisseria meningitidis NOT DETECTED NOT DETECTED Final   Pseudomonas aeruginosa NOT DETECTED NOT DETECTED Final   Stenotrophomonas maltophilia NOT DETECTED NOT DETECTED Final   Candida albicans NOT DETECTED NOT DETECTED Final   Candida auris NOT DETECTED NOT DETECTED Final   Candida glabrata NOT DETECTED NOT DETECTED Final   Candida krusei NOT DETECTED NOT DETECTED Final   Candida parapsilosis NOT DETECTED NOT DETECTED Final   Candida tropicalis NOT DETECTED NOT DETECTED Final   Cryptococcus neoformans/gattii NOT DETECTED NOT DETECTED Final   Methicillin resistance mecA/C DETECTED (A) NOT DETECTED Final    Comment: CRITICAL RESULT CALLED TO, READ BACK BY AND VERIFIED WITH: SUSAN WATSON 1506 09/20/21 MU Performed at [facility], [address], [place],  [address]     Coagulation Studies: Recent Labs    09/19/21 1859  LABPROT 15.9*  INR  1.3*     Urinalysis: Recent Labs    09/19/21 2245  COLORURINE YELLOW  LABSPEC >1.030*  PHURINE 5.0  GLUCOSEU NEGATIVE  HGBUR NEGATIVE  BILIRUBINUR MODERATE*  KETONESUR TRACE*  PROTEINUR 100*  NITRITE NEGATIVE  LEUKOCYTESUR NEGATIVE       Imaging: US RENAL  Result Date: 09/20/2021 CLINICAL DATA:  Acute kidney insufficiency. EXAM: RENAL / URINARY TRACT ULTRASOUND COMPLETE COMPARISON:  None. FINDINGS: Right Kidney: Renal measurements: 11.8 x 6.3 x 5.3 cm = volume: 203 mL. Echogenicity within normal limits. No mass or hydronephrosis visualized. Minimal perinephric free fluid is noted. Left Kidney: Renal measurements: 12.5 x 5.6 x 5.9 cm = volume: 216 mL. Echogenicity within normal limits. No mass or hydronephrosis visualized. Bladder: Appears normal for degree of bladder distention. Other: None. IMPRESSION: Unremarkable bilateral renal ultrasound Electronically Signed   By: San Morelle M.D.   On: 09/20/2021 15:27     Medications:    cefTRIAXone (ROCEPHIN)  IV 2 g (09/21/21 2221)    amLODipine   10 mg Oral Daily   azithromycin  500 mg Oral QPM   carvedilol  12.5 mg Oral BID WC   Chlorhexidine Gluconate Cloth  6 each Topical Daily   cholecalciferol  1,000 Units Oral Daily   furosemide  60 mg Intravenous BID   heparin injection (subcutaneous)  5,000 Units Subcutaneous Q8H   insulin aspart  0-9 Units Subcutaneous TID PC & HS   multivitamin with minerals  1 tablet Oral Daily   omega-3 acid ethyl esters  1 g Oral Daily   acetaminophen **OR** acetaminophen, magnesium hydroxide, morphine injection, nitroGLYCERIN, ondansetron, traZODone  Assessment/ Plan:  Ms. MARISOL GIAMBRA is a 75 y.o.  female with past medical history of chronic diastolic heart failure, chronic respiratory failure on 2 L oxygen at home, diabetes, hypertension, dyslipidemia, osteoporosis, and CKD stage IIIb.  Patient presents to the emergency room with altered mental status and worsening shortness of breath.  She will be admitted for Elevated liver function tests [R79.89] NSTEMI (non-ST elevated myocardial infarction) ([place]) [I21.4] AKI (acute kidney injury) ([place]) [N17.9]  Acute Kidney Injury on chronic kidney disease stage 3b with baseline creatinine 1.74 and GFR of 31 on 08/15/21.  Acute kidney injury likely secondary to cardiorenal syndrome Lisinopril held.  No IV contrast exposure. Renal ultrasound ordered to evaluate obstruction.   Creatinine continues to improve slowly along with edema.  Continue furosemide 60 mg IV twice daily.  Patient encouraged to continue fluid restriction.  Lab Results  Component Value Date   CREATININE 3.81 (H) 09/22/2021   CREATININE 3.95 (H) 09/21/2021   CREATININE 4.41 (H) 09/20/2021    Intake/Output Summary (Last 24 hours) at 09/22/2021 1200 Last data filed at 09/22/2021 1008 Gross per 24 hour  Intake 720 ml  Output 700 ml  Net 20 ml    2. Anemia of chronic kidney disease: Normocytic Lab Results  Component Value Date   HGB 9.0 (L) 09/21/2021    Hemoglobin just barely  within target.  We will continue to monitor   3. Secondary Hyperparathyroidism: with outpatient labs: PTH 84, phosphorus 4.6, calcium 8.9 on 08/15/21.   Lab Results  Component Value Date   CALCIUM 8.1 (L) 09/22/2021  Calcium remains low but improving.  We will continue to monitor  4.  Hypertension with chronic kidney disease.  Home regimen includes amlodipine, carvedilol, furosemide, and lisinopril.  Lisinopril currently on hold.  Blood pressure soft this morning at 110/55  5. Diabetes mellitus type II with chronic  kidney disease noninsulin dependent. Home regimen includes glipizide. Most recent hemoglobin A1c is 5.4 on 07/24/21.  Glucose slightly elevated this morning      LOS: 3 Stuart Mirabile 12/9/202212:00 PM

## 2021-09-22 NOTE — Progress Notes (Signed)
Progress Note    Julie [name]  YYT:[id number] DOB: Sep 15, 1946  DOA: 09/19/2021 PCP: Gladstone Lighter, MD      Brief Narrative:    Medical records reviewed and are as summarized below:  Julie [name] is a 75 y.o. female with medical history significant for chronic diastolic CHF, stage IIIb CKD, chronic hypoxemic respiratory failure on 2 L/min oxygen at home, type II DM, hypertension, dyslipidemia, osteoporosis.  She presented to the hospital because of altered mental status shortness of breath that is worse with exertion, orthopnea and lower extremity edema.  History is significant for a fall at home about 2 days prior to admission.  Reportedly oxygen saturation was 68% on room air when EMS picked her up.  Her troponins evaluated ([phone]).  She was admitted to the hospital for acute on chronic diastolic CHF, acute on chronic hypoxemic respiratory failure and acute metabolic encephalopathy.  Elevated troponins were attributed to demand ischemia per cardiologist.  She was also found to have AKI and elevated liver enzymes probably from acute CHF.    Assessment/Plan:   Principal Problem:   Demand ischemia (HCC) Active Problems:   Acute on chronic respiratory failure with hypoxia (HCC)   Acute on chronic diastolic CHF (congestive heart failure) (HCC)   Body mass index is 47.33 kg/m.  (Morbid obesity)   Acute on chronic diastolic CHF: Continue IV Lasix.  Monitor BMP, urine output and daily weight.  Discontinue Foley catheter.  2D echo showed EF estimated at 65 to 70%, normal LV diastolic parameters.  BNP was 848.5.  Elevated troponins: Probably from demand ischemia.  No chest pain. No plan for left heart cath  Acute on chronic hypoxemic respiratory failure: Continue 4 L/min oxygen and taper down oxygen as able.  She uses 2 L/min oxygen at home  Left lower lobe consolidation concerning for atelectasis or pneumonia: Blood culture showed staph lugdunensis and staph  epidermidis which are likely contaminants.  Discontinue IV Rocephin and start [place].  Discontinue azithromycin after today's dose.  AKI on CKD stage IIIb, hyperkalemia: Probably from cardiorenal syndrome.  Urine output has improved and creatinine is trending down.  Continue IV Lasix.  Follow-up with nephrologist.  Hyponatremia: Improving.  She has been placed on fluid restriction up to 1.2 L/day.  Elevated liver enzymes: This is probably due to acute CHF.  She has cholelithiasis noted on CT chest.  Repeat liver enzymes.  Acute metabolic encephalopathy: Improved  Type II DM: Hold glipizide.  Use NovoLog as needed for hyperglycemia.  Generalized weakness/fall at home: PT and OT recommend home health therapy   Diet Order             Diet Heart Room service appropriate? Yes; Fluid consistency: Thin; Fluid restriction: 1200 mL Fluid  Diet effective now                      Consultants: Cardiologist Nephrologist  Procedures: None    Medications:    amLODipine  10 mg Oral Daily   azithromycin  500 mg Oral QPM   carvedilol  12.5 mg Oral BID WC   Chlorhexidine Gluconate Cloth  6 each Topical Daily   cholecalciferol  1,000 Units Oral Daily   furosemide  60 mg Intravenous BID   heparin injection (subcutaneous)  5,000 Units Subcutaneous Q8H   insulin aspart  0-9 Units Subcutaneous TID PC & HS   multivitamin with minerals  1 tablet Oral Daily   omega-3 acid ethyl  esters  1 g Oral Daily   Continuous Infusions:  cefTRIAXone (ROCEPHIN)  IV 2 g (09/21/21 2221)     Anti-infectives (From admission, onward)    Start     Dose/Rate Route Frequency Ordered Stop   09/21/21 1800  azithromycin (ZITHROMAX) tablet 500 mg        500 mg Oral Every evening 09/21/21 1536 09/24/21 1759   09/19/21 2030  cefTRIAXone (ROCEPHIN) 2 g in sodium chloride 0.9 % 100 mL IVPB        2 g 200 mL/hr over 30 Minutes Intravenous Every 24 hours 09/19/21 2028 09/24/21 2029   09/19/21 2030   azithromycin (ZITHROMAX) 500 mg in sodium chloride 0.9 % 250 mL IVPB  Status:  Discontinued        500 mg 250 mL/hr over 60 Minutes Intravenous Every 24 hours 09/19/21 2028 09/21/21 1536              Family Communication/Anticipated D/C date and plan/Code Status   DVT prophylaxis: heparin injection 5,000 Units Start: 09/21/21 1630     Code Status: DNR  Family Communication: [place], family friend, at the bedside (with patient's permission) Disposition Plan: Possible discharge to home in 1 to 2 days   Status is: Inpatient  Remains inpatient appropriate because: Acute heart failure           Subjective:   Interval events noted.  No shortness of breath or chest pain.  She feels better.  Objective:    Vitals:   09/22/21 0423 09/22/21 0600 09/22/21 0820 09/22/21 1135  BP: (!) 121/57  (!) 117/59 (!) 110/55  Pulse: 65  67 66  Resp: 17     Temp: 98 F (36.7 C)   97.7 F (36.5 C)  TempSrc: Oral   Oral  SpO2:   96% 96%  Weight:  129 kg    Height:       No data found.   Intake/Output Summary (Last 24 hours) at 09/22/2021 1353 Last data filed at 09/22/2021 1008 Gross per 24 hour  Intake 480 ml  Output 700 ml  Net -220 ml   Filed Weights   09/19/21 1849 09/21/21 1443 09/22/21 0600  Weight: 136.1 kg 126.6 kg 129 kg    Exam:  GEN: NAD SKIN: Warm and dry EYES: No pallor or icterus ENT: MMM CV: RRR PULM: CTA B ABD: soft, obese, NT, +BS CNS: AAO x 3, non focal EXT: Bilateral leg edema is improving.  No tenderness         Data Reviewed:   I have personally reviewed following labs and imaging studies:  Labs: Labs show the following:   Basic Metabolic Panel: Recent Labs  Lab 09/16/21 1737 09/19/21 1859 09/20/21 0451 09/21/21 0452 09/22/21 0559  NA 137 134* 134* 128* 133*  K 4.2 5.7* 5.1 4.8 4.9  CL 100 97* 100 97* 97*  CO2 29 24 24 23 26   GLUCOSE 164* 182* 143* 125* 163*  BUN 42* 77* 82* 79* 93*  CREATININE 1.86* 4.68* 4.41*  3.95* 3.81*  CALCIUM 8.8* 8.8* 8.4* 7.7* 8.1*   GFR Estimated Creatinine Clearance: 17.8 mL/min (A) (by C-G formula based on SCr of 3.81 mg/dL (H)). Liver Function Tests: Recent Labs  Lab 09/16/21 1737 09/19/21 1859 09/20/21 0451 09/21/21 0452 09/22/21 0559  AST 18 874* 485* 173* 66*  ALT 19 1,399* 1,046* 706* 515*  ALKPHOS 69 77 65 59 57  BILITOT 0.9 1.1 0.9 0.5 0.7  PROT 6.7 6.7 5.9* 5.5* 5.7*  ALBUMIN 3.6 3.6 3.1* 2.8* 2.9*   No results for input(s): LIPASE, AMYLASE in the last 168 hours. No results for input(s): AMMONIA in the last 168 hours. Coagulation profile Recent Labs  Lab 09/19/21 1859  INR 1.3*    CBC: Recent Labs  Lab 09/16/21 1737 09/19/21 1859 09/20/21 0451 09/21/21 0452  WBC 5.6 11.1* 9.8 6.5  NEUTROABS 4.4 9.7*  --   --   HGB 10.2* 10.6* 9.7* 9.0*  HCT 33.9* 34.9* 31.5* 29.5*  MCV 82.5 82.1 81.4 79.1*  PLT 170 145* 143* 128*   Cardiac Enzymes: Recent Labs  Lab 09/19/21 1859  CKTOTAL 487*   BNP (last 3 results) No results for input(s): PROBNP in the last 8760 hours. CBG: Recent Labs  Lab 09/21/21 1106 09/21/21 1614 09/21/21 2204 09/22/21 0924 09/22/21 1136  GLUCAP 249* 203* 209* 195* 176*   D-Dimer: No results for input(s): DDIMER in the last 72 hours. Hgb A1c: No results for input(s): HGBA1C in the last 72 hours. Lipid Profile: No results for input(s): CHOL, HDL, LDLCALC, TRIG, CHOLHDL, LDLDIRECT in the last 72 hours. Thyroid function studies: No results for input(s): TSH, T4TOTAL, T3FREE, THYROIDAB in the last 72 hours.  Invalid input(s): FREET3 Anemia work up: No results for input(s): VITAMINB12, FOLATE, FERRITIN, TIBC, IRON, RETICCTPCT in the last 72 hours. Sepsis Labs: Recent Labs  Lab 09/16/21 1737 09/19/21 1859 09/19/21 2043 09/20/21 0451 09/21/21 0452  WBC 5.6 11.1*  --  9.8 6.5  LATICACIDVEN  --   --  0.9 0.7  --     Microbiology Recent Results (from the past 240 hour(s))  Resp Panel by RT-PCR (Flu A&B,  Covid) Nasopharyngeal Swab     Status: None   Collection Time: 09/16/21  7:45 PM   Specimen: Nasopharyngeal Swab; Nasopharyngeal(NP) swabs in vial transport medium  Result Value Ref Range Status   SARS Coronavirus 2 by RT PCR NEGATIVE NEGATIVE Final    Comment: (NOTE) SARS-CoV-2 target nucleic acids are NOT DETECTED.  The SARS-CoV-2 RNA is generally detectable in upper respiratory specimens during the acute phase of infection. The lowest concentration of SARS-CoV-2 viral copies this assay can detect is 138 copies/mL. A negative result does not preclude SARS-Cov-2 infection and should not be used as the sole basis for treatment or other patient management decisions. A negative result may occur with  improper specimen collection/handling, submission of specimen other than nasopharyngeal swab, presence of viral mutation(s) within the areas targeted by this assay, and inadequate number of viral copies(<138 copies/mL). A negative result must be combined with clinical observations, patient history, and epidemiological information. The expected result is Negative.  Fact Sheet for Patients:  EntrepreneurPulse.com.au  Fact Sheet for Healthcare Providers:  IncredibleEmployment.be  This test is no t yet approved or cleared by the [place] FDA and  has been authorized for detection and/or diagnosis of SARS-CoV-2 by FDA under an Emergency Use Authorization (EUA). This EUA will remain  in effect (meaning this test can be used) for the duration of the COVID-19 declaration under Section 564(b)(1) of the Act, 21 U.S.C.section 360bbb-3(b)(1), unless the authorization is terminated  or revoked sooner.       Influenza A by PCR NEGATIVE NEGATIVE Final   Influenza B by PCR NEGATIVE NEGATIVE Final    Comment: (NOTE) The Xpert Xpress SARS-CoV-2/FLU/RSV plus assay is intended as an aid in the diagnosis of influenza from Nasopharyngeal swab specimens and should  not be used as a sole basis for treatment. Nasal washings and aspirates  are unacceptable for Xpert Xpress SARS-CoV-2/FLU/RSV testing.  Fact Sheet for Patients: EntrepreneurPulse.com.au  Fact Sheet for Healthcare Providers: IncredibleEmployment.be  This test is not yet approved or cleared by the [place] FDA and has been authorized for detection and/or diagnosis of SARS-CoV-2 by FDA under an Emergency Use Authorization (EUA). This EUA will remain in effect (meaning this test can be used) for the duration of the COVID-19 declaration under Section 564(b)(1) of the Act, 21 U.S.C. section 360bbb-3(b)(1), unless the authorization is terminated or revoked.  Performed at [facility], [address] [place], [place] [address]   Resp Panel by RT-PCR (Flu A&B, Covid) Nasopharyngeal Swab     Status: None   Collection Time: 09/19/21  6:59 PM   Specimen: Nasopharyngeal Swab; Nasopharyngeal(NP) swabs in vial transport medium  Result Value Ref Range Status   SARS Coronavirus 2 by RT PCR NEGATIVE NEGATIVE Final    Comment: (NOTE) SARS-CoV-2 target nucleic acids are NOT DETECTED.  The SARS-CoV-2 RNA is generally detectable in upper respiratory specimens during the acute phase of infection. The lowest concentration of SARS-CoV-2 viral copies this assay can detect is 138 copies/mL. A negative result does not preclude SARS-Cov-2 infection and should not be used as the sole basis for treatment or other patient management decisions. A negative result may occur with  improper specimen collection/handling, submission of specimen other than nasopharyngeal swab, presence of viral mutation(s) within the areas targeted by this assay, and inadequate number of viral copies(<138 copies/mL). A negative result must be combined with clinical observations, patient history, and epidemiological information. The expected result is Negative.  Fact Sheet for  Patients:  EntrepreneurPulse.com.au  Fact Sheet for Healthcare Providers:  IncredibleEmployment.be  This test is no t yet approved or cleared by the [place] FDA and  has been authorized for detection and/or diagnosis of SARS-CoV-2 by FDA under an Emergency Use Authorization (EUA). This EUA will remain  in effect (meaning this test can be used) for the duration of the COVID-19 declaration under Section 564(b)(1) of the Act, 21 U.S.C.section 360bbb-3(b)(1), unless the authorization is terminated  or revoked sooner.       Influenza A by PCR NEGATIVE NEGATIVE Final   Influenza B by PCR NEGATIVE NEGATIVE Final    Comment: (NOTE) The Xpert Xpress SARS-CoV-2/FLU/RSV plus assay is intended as an aid in the diagnosis of influenza from Nasopharyngeal swab specimens and should not be used as a sole basis for treatment. Nasal washings and aspirates are unacceptable for Xpert Xpress SARS-CoV-2/FLU/RSV testing.  Fact Sheet for Patients: EntrepreneurPulse.com.au  Fact Sheet for Healthcare Providers: IncredibleEmployment.be  This test is not yet approved or cleared by the [place] FDA and has been authorized for detection and/or diagnosis of SARS-CoV-2 by FDA under an Emergency Use Authorization (EUA). This EUA will remain in effect (meaning this test can be used) for the duration of the COVID-19 declaration under Section 564(b)(1) of the Act, 21 U.S.C. section 360bbb-3(b)(1), unless the authorization is terminated or revoked.  Performed at [facility], [address] [place], [place] [address]   Blood culture (routine x 2)     Status: None (Preliminary result)   Collection Time: 09/19/21  8:43 PM   Specimen: BLOOD  Result Value Ref Range Status   Specimen Description BLOOD RAC  Final   Special Requests   Final    BOTTLES DRAWN AEROBIC AND ANAEROBIC Blood Culture adequate volume   Culture    Final    NO GROWTH 3 DAYS  Performed at [facility], [address] [place], [place] [address]    Report Status PENDING  Incomplete  Blood culture (routine x 2)     Status: Abnormal (Preliminary result)   Collection Time: 09/19/21  8:43 PM   Specimen: BLOOD  Result Value Ref Range Status   Specimen Description   Final    BLOOD LAC Performed at [facility], [address], [place], [place] [address]    Special Requests   Final    BOTTLES DRAWN AEROBIC AND ANAEROBIC Blood Culture adequate volume Performed at [facility], [address] [place], [place] [address]    Culture  Setup Time   Final    Organism ID to follow GRAM POSITIVE COCCI IN BOTH AEROBIC AND ANAEROBIC BOTTLES CRITICAL RESULT CALLED TO, READ BACK BY AND VERIFIED WITH: SUSAN WATSON 1506 09/20/21 MU Performed at [facility], [address] [place], [place] [address]    Culture (A)  Final    STAPHYLOCOCCUS LUGDUNENSIS STAPHYLOCOCCUS EPIDERMIDIS    Report Status PENDING  Incomplete  Blood Culture ID Panel (Reflexed)     Status: Abnormal   Collection Time: 09/19/21  8:43 PM  Result Value Ref Range Status   Enterococcus faecalis NOT DETECTED NOT DETECTED Final   Enterococcus Faecium NOT DETECTED NOT DETECTED Final   Listeria monocytogenes NOT DETECTED NOT DETECTED Final   Staphylococcus species DETECTED (A) NOT DETECTED Final    Comment: CRITICAL RESULT CALLED TO, READ BACK BY AND VERIFIED WITH: SUSAN WATSON 1506 09/20/21 MU    Staphylococcus aureus (BCID) NOT DETECTED NOT DETECTED Final   Staphylococcus epidermidis DETECTED (A) NOT DETECTED Final    Comment: Methicillin (oxacillin) resistant coagulase negative staphylococcus. Possible blood culture contaminant (unless isolated from more than one blood culture draw or clinical case suggests pathogenicity). No antibiotic treatment is indicated for blood  culture contaminants. CRITICAL RESULT CALLED TO, READ BACK BY AND  VERIFIED WITH: SUSAN WATSON 1506 09/20/21 MU    Staphylococcus lugdunensis DETECTED (A) NOT DETECTED Final    Comment: Methicillin (oxacillin) resistant coagulase negative staphylococcus. Possible blood culture contaminant (unless isolated from more than one blood culture draw or clinical case suggests pathogenicity). No antibiotic treatment is indicated for blood  culture contaminants. CRITICAL RESULT CALLED TO, READ BACK BY AND VERIFIED WITH: [place] [id number] 09/20/21 MU    Streptococcus species NOT DETECTED NOT DETECTED Final   Streptococcus agalactiae NOT DETECTED NOT DETECTED Final   Streptococcus pneumoniae NOT DETECTED NOT DETECTED Final   Streptococcus pyogenes NOT DETECTED NOT DETECTED Final   A.calcoaceticus-baumannii NOT DETECTED NOT DETECTED Final   Bacteroides fragilis NOT DETECTED NOT DETECTED Final   Enterobacterales NOT DETECTED NOT DETECTED Final   Enterobacter cloacae complex NOT DETECTED NOT DETECTED Final   Escherichia coli NOT DETECTED NOT DETECTED Final   Klebsiella aerogenes NOT DETECTED NOT DETECTED Final   Klebsiella oxytoca NOT DETECTED NOT DETECTED Final   Klebsiella pneumoniae NOT DETECTED NOT DETECTED Final   Proteus species NOT DETECTED NOT DETECTED Final   Salmonella species NOT DETECTED NOT DETECTED Final   Serratia marcescens NOT DETECTED NOT DETECTED Final   Haemophilus influenzae NOT DETECTED NOT DETECTED Final   Neisseria meningitidis NOT DETECTED NOT DETECTED Final   Pseudomonas aeruginosa NOT DETECTED NOT DETECTED Final   Stenotrophomonas maltophilia NOT DETECTED NOT DETECTED Final   Candida albicans NOT DETECTED NOT DETECTED Final   Candida auris NOT DETECTED NOT DETECTED Final   Candida glabrata NOT DETECTED NOT DETECTED Final  Candida krusei NOT DETECTED NOT DETECTED Final   Candida parapsilosis NOT DETECTED NOT DETECTED Final   Candida tropicalis NOT DETECTED NOT DETECTED Final   Cryptococcus neoformans/gattii NOT DETECTED NOT DETECTED Final    Methicillin resistance mecA/C DETECTED (A) NOT DETECTED Final    Comment: CRITICAL RESULT CALLED TO, READ BACK BY AND VERIFIED WITH: SUSAN WATSON 1506 2021/10/01 MU Performed at [facility], [address], [place], [place] [address]     Procedures and diagnostic studies:  US RENAL  Result Date: 10-01-2021 CLINICAL DATA:  Acute kidney insufficiency. EXAM: RENAL / URINARY TRACT ULTRASOUND COMPLETE COMPARISON:  None. FINDINGS: Right Kidney: Renal measurements: 11.8 x 6.3 x 5.3 cm = volume: 203 mL. Echogenicity within normal limits. No mass or hydronephrosis visualized. Minimal perinephric free fluid is noted. Left Kidney: Renal measurements: 12.5 x 5.6 x 5.9 cm = volume: 216 mL. Echogenicity within normal limits. No mass or hydronephrosis visualized. Bladder: Appears normal for degree of bladder distention. Other: None. IMPRESSION: Unremarkable bilateral renal ultrasound Electronically Signed   By: San Morelle M.D.   On: Oct 01, 2021 15:27               LOS: 3 days   Isao Seltzer  [facility]   Pager on [email or web address]. If 7PM-7AM, please contact night-coverage at [email or web address]     09/22/2021, 1:53 PM

## 2021-09-22 NOTE — Progress Notes (Signed)
[facility] Encounter Note  Patient: ANALISA SLEDD / Admit Date: 09/19/2021 / Date of Encounter: 09/22/2021, 11:03 AM   Subjective: Patient with a known history of diastolic congestive heart failure presenting with worsening kidney function, slowly improving.  Today patient is breathing well and has no complaints of shortness of breath, chest pain, worsening lower extremity edema.  She does have mild crackles in the lower bases of bilateral lungs, however otherwise without any significant edema of the legs.  Patient had elevated troponin on admission most consistent with hypoxia and demand ischemia rather than acute coronary syndrome.  She currently is without any true anginal symptoms or anginal equivalent at this time.  Echocardiogram has showed normal LV systolic function with no evidence of significant valvular heart disease and ejection fraction greater than 60%. Patient has had adequate urine output over the last 24 hours despite AKI which has only mildly improved over the last few days.   Review of Systems: Positive for: Shortness of breath Negative for: Vision change, hearing change, syncope, dizziness, nausea, vomiting,diarrhea, bloody stool, stomach pain, cough, congestion, diaphoresis, urinary frequency, urinary pain,skin lesions, skin rashes Others previously listed  Objective: Telemetry: Normal sinus rhythm Physical Exam: Blood pressure (!) 117/59, pulse 67, temperature 98 F (36.7 C), temperature source Oral, resp. rate 17, height 5\' 5"  (1.651 m), weight 129 kg, SpO2 96 %. Body mass index is 47.33 kg/m. General: Well developed, well nourished, in no acute distress. Head: Normocephalic, atraumatic, sclera non-icteric, no xanthomas, nares are without discharge. Neck: No apparent masses Lungs: Normal respirations with few wheezes, some rhonchi, no rales , basilar crackles left basilar decreased breath sounds  Heart: Regular rate and rhythm, normal S1 S2, no  murmur, no rub, no gallop, PMI is normal size and placement, carotid upstroke normal without bruit, jugular venous pressure normal Abdomen: Soft, non-tender, non-distended with normoactive bowel sounds. No hepatosplenomegaly. Abdominal aorta is normal size without bruit Extremities: 1+ edema, no clubbing, no cyanosis, no ulcers,  Peripheral: 2+ radial, 2+ femoral, 2+ dorsal pedal pulses Neuro: Alert and oriented. Moves all extremities spontaneously. Psych:  Responds to questions appropriately with a normal affect.   Intake/Output Summary (Last 24 hours) at 09/22/2021 1103 Last data filed at 09/22/2021 1008 Gross per 24 hour  Intake 720 ml  Output 700 ml  Net 20 ml    Inpatient Medications:  . amLODipine  10 mg Oral Daily  . azithromycin  500 mg Oral QPM  . carvedilol  12.5 mg Oral BID WC  . Chlorhexidine Gluconate Cloth  6 each Topical Daily  . cholecalciferol  1,000 Units Oral Daily  . furosemide  60 mg Intravenous BID  . heparin injection (subcutaneous)  5,000 Units Subcutaneous Q8H  . insulin aspart  0-9 Units Subcutaneous TID PC & HS  . multivitamin with minerals  1 tablet Oral Daily  . omega-3 acid ethyl esters  1 g Oral Daily   Infusions:  . cefTRIAXone (ROCEPHIN)  IV 2 g (09/21/21 2221)    Labs: Recent Labs    09/21/21 0452 09/22/21 0559  NA 128* 133*  K 4.8 4.9  CL 97* 97*  CO2 23 26  GLUCOSE 125* 163*  BUN 79* 93*  CREATININE 3.95* 3.81*  CALCIUM 7.7* 8.1*   Recent Labs    09/21/21 0452 09/22/21 0559  AST 173* 66*  ALT 706* 515*  ALKPHOS 59 57  BILITOT 0.5 0.7  PROT 5.5* 5.7*  ALBUMIN 2.8* 2.9*   Recent Labs  09/19/21 1859 09/20/21 0451 09/21/21 0452  WBC 11.1* 9.8 6.5  NEUTROABS 9.7*  --   --   HGB 10.6* 9.7* 9.0*  HCT 34.9* 31.5* 29.5*  MCV 82.1 81.4 79.1*  PLT 145* 143* 128*   Recent Labs    09/19/21 1859  CKTOTAL 487*   Invalid input(s): POCBNP No results for input(s): HGBA1C in the last 72 hours.   Weights: Filed Weights    09/19/21 1849 09/21/21 1443 09/22/21 0600  Weight: 136.1 kg 126.6 kg 129 kg     Radiology/Studies:  CT HEAD WO CONTRAST (5MM)  Result Date: 09/19/2021 CLINICAL DATA:  Altered mental status, initial encounter EXAM: CT HEAD WITHOUT CONTRAST CT CERVICAL SPINE WITHOUT CONTRAST TECHNIQUE: Multidetector CT imaging of the head and cervical spine was performed following the standard protocol without intravenous contrast. Multiplanar CT image reconstructions of the cervical spine were also generated. COMPARISON:  07/03/2018 FINDINGS: CT HEAD FINDINGS Brain: No evidence of acute infarction, hemorrhage, hydrocephalus, extra-axial collection or mass lesion/mass effect. Vascular: No hyperdense vessel or unexpected calcification. Skull: Normal. Negative for fracture or focal lesion. Sinuses/Orbits: No acute finding. Other: None. CT CERVICAL SPINE FINDINGS Alignment: Loss of normal cervical lordosis is noted likely related to muscular spasm. Skull base and vertebrae: 7 cervical segments are well visualized. Vertebral body height is well maintained. No acute fracture or acute facet abnormality is noted. The odontoid is within normal limits. Soft tissues and spinal canal: Surrounding soft tissue structures show vascular calcifications. No focal hematoma is noted. Upper chest: Visualized lung apices are within normal limits. Other: None IMPRESSION: CT of the head: No acute intracranial abnormality noted. CT of cervical spine: Mild loss of the normal cervical lordosis likely related to muscular spasm. No acute bony abnormality is noted. Electronically Signed   By: Inez Catalina M.D.   On: 09/19/2021 19:50   CT Chest Wo Contrast  Result Date: 09/19/2021 CLINICAL DATA:  Persistent cough. EXAM: CT CHEST WITHOUT CONTRAST TECHNIQUE: Multidetector CT imaging of the chest was performed following the standard protocol without IV contrast. COMPARISON:  Chest radiograph dated 09/19/2021 and CT dated 09/16/2021. FINDINGS: Evaluation  of this exam is limited in the absence of intravenous contrast as well as due to respiratory motion. Cardiovascular: Mild cardiomegaly. No pericardial effusion. Advanced 3 vessel coronary vascular calcification. Mild atherosclerotic calcification of the thoracic aorta. No aneurysmal dilatation. The central pulmonary arteries are grossly unremarkable. Mediastinum/Nodes: No hilar or mediastinal adenopathy. The esophagus is grossly unremarkable. No mediastinal fluid collection. Lungs/Pleura: There is eventration of the left hemidiaphragm. There is partial consolidative changes of the left lower lobe which may represent atelectasis or pneumonia. Right lung base subsegmental densities may represent atelectasis. There is diffuse mosaic attenuation of the lung with areas of air trapping which may represent a underlying small airways versus small vessel disease. No pleural effusion or pneumothorax. The central airways are patent. Upper Abdomen: Multiple gallstones. Musculoskeletal: Degenerative changes of the spine. No acute osseous pathology the IMPRESSION: 1. Partial consolidative changes of the left lower lobe may represent atelectasis or pneumonia. 2. Mosaic attenuation of the lung parenchyma with areas of air trapping likely representing underlying small airways versus small vessel disease. 3. Cholelithiasis. 4. Aortic Atherosclerosis (ICD10-I70.0). Electronically Signed   By: Anner Crete M.D.   On: 09/19/2021 19:57   CT CHEST WO CONTRAST  Result Date: 09/16/2021 CLINICAL DATA:  Back pain following coughing,, history of recent fall, initial encounter initial encounter EXAM: CT CHEST WITHOUT CONTRAST TECHNIQUE: Multidetector CT imaging of the  chest was performed following the standard protocol without IV contrast. COMPARISON:  07/24/2021 FINDINGS: Cardiovascular: Limited due to lack of IV contrast. Atherosclerotic calcifications of the aorta are noted without aneurysmal dilatation. The heart is at the upper  limits of normal in size. Coronary calcifications are seen. No enlargement of pulmonary artery is noted. Mediastinum/Nodes: Thoracic inlet is within normal limits. Scattered small mediastinal lymph nodes are noted stable from a prior CT from 07/07/2021. These are likely reactive in nature but not significant by size criteria. The esophagus as visualized is within normal limits. Lungs/Pleura: Bibasilar atelectatic changes are noted. No sizable effusion is seen. Some mosaic attenuation is noted throughout both lungs stable in appearance from the prior exam. No focal nodule is seen. No confluent infiltrate is noted. Upper Abdomen: Multiple gallstones are noted in the right upper quadrant. Remainder of the upper abdomen appears within normal limits. Musculoskeletal: No acute rib abnormality is noted. No acute compression deformity is seen. No sternal fracture is noted. IMPRESSION: Cholelithiasis without complicating factors. No compression deformity or rib fracture is noted. Stable changes of mosaic attenuation within the lungs likely representing some air trapping. No focal confluent infiltrate is seen. Mild bibasilar atelectasis is noted. Aortic Atherosclerosis (ICD10-I70.0). Electronically Signed   By: Inez Catalina M.D.   On: 09/16/2021 19:18   CT Cervical Spine Wo Contrast  Result Date: 09/19/2021 CLINICAL DATA:  Altered mental status, initial encounter EXAM: CT HEAD WITHOUT CONTRAST CT CERVICAL SPINE WITHOUT CONTRAST TECHNIQUE: Multidetector CT imaging of the head and cervical spine was performed following the standard protocol without intravenous contrast. Multiplanar CT image reconstructions of the cervical spine were also generated. COMPARISON:  07/03/2018 FINDINGS: CT HEAD FINDINGS Brain: No evidence of acute infarction, hemorrhage, hydrocephalus, extra-axial collection or mass lesion/mass effect. Vascular: No hyperdense vessel or unexpected calcification. Skull: Normal. Negative for fracture or focal  lesion. Sinuses/Orbits: No acute finding. Other: None. CT CERVICAL SPINE FINDINGS Alignment: Loss of normal cervical lordosis is noted likely related to muscular spasm. Skull base and vertebrae: 7 cervical segments are well visualized. Vertebral body height is well maintained. No acute fracture or acute facet abnormality is noted. The odontoid is within normal limits. Soft tissues and spinal canal: Surrounding soft tissue structures show vascular calcifications. No focal hematoma is noted. Upper chest: Visualized lung apices are within normal limits. Other: None IMPRESSION: CT of the head: No acute intracranial abnormality noted. CT of cervical spine: Mild loss of the normal cervical lordosis likely related to muscular spasm. No acute bony abnormality is noted. Electronically Signed   By: Inez Catalina M.D.   On: 09/19/2021 19:50   US RENAL  Result Date: 09/20/2021 CLINICAL DATA:  Acute kidney insufficiency. EXAM: RENAL / URINARY TRACT ULTRASOUND COMPLETE COMPARISON:  None. FINDINGS: Right Kidney: Renal measurements: 11.8 x 6.3 x 5.3 cm = volume: 203 mL. Echogenicity within normal limits. No mass or hydronephrosis visualized. Minimal perinephric free fluid is noted. Left Kidney: Renal measurements: 12.5 x 5.6 x 5.9 cm = volume: 216 mL. Echogenicity within normal limits. No mass or hydronephrosis visualized. Bladder: Appears normal for degree of bladder distention. Other: None. IMPRESSION: Unremarkable bilateral renal ultrasound Electronically Signed   By: San Morelle M.D.   On: 09/20/2021 15:27   DG Chest Portable 1 View  Result Date: 09/19/2021 CLINICAL DATA:  Shortness of breath EXAM: PORTABLE CHEST 1 VIEW COMPARISON:  07/24/2021 FINDINGS: Low lung volumes. Cardiomegaly with central bronchovascular crowding. Bilateral bronchitic changes without focal opacity, pleural effusion or pneumothorax. IMPRESSION: Cardiomegaly  with low lung volumes causing central bronchovascular crowding. Bronchitic changes  without definitive focal pulmonary opacity. Electronically Signed   By: Donavan Foil M.D.   On: 09/19/2021 19:06   ECHOCARDIOGRAM COMPLETE  Result Date: 09/20/2021    ECHOCARDIOGRAM REPORT   Patient Name:   VALLA PACEY Date of Exam: 09/20/2021 Medical Rec #:  [id number]         Height:       65.0 in Accession #:    [id number]        Weight:       300.0 lb Date of Birth:  1946-06-26         BSA:          2.350 m Patient Age:    31 years          BP:           116/52 mmHg Patient Gender: F                 HR:           64 bpm. Exam Location:  [facility] Procedure: 2D Echo, Color Doppler, Cardiac Doppler and Intracardiac            Opacification Agent Indications:     I21.4 NSTEMI  History:         Patient has no prior history of Echocardiogram examinations.                  CHF, CKD; Risk Factors:Hypertension, Dyslipidemia and Diabetes.  Sonographer:     Charmayne Sheer Referring Phys:  [id number] [place] Diagnosing Phys: Serafina Royals MD  Sonographer Comments: Technically difficult study due to poor echo windows. Image acquisition challenging due to patient body habitus. IMPRESSIONS  1. Left ventricular ejection fraction, by estimation, is 65 to 70%. The left ventricle has normal function. The left ventricle has no regional wall motion abnormalities. Left ventricular diastolic parameters were normal.  2. Right ventricular systolic function is normal. The right ventricular size is normal.  3. The mitral valve is normal in structure. Mild mitral valve regurgitation.  4. The aortic valve is normal in structure. Aortic valve regurgitation is not visualized. FINDINGS  Left Ventricle: Left ventricular ejection fraction, by estimation, is 65 to 70%. The left ventricle has normal function. The left ventricle has no regional wall motion abnormalities. Definity contrast agent was given IV to delineate the left ventricular  endocardial borders. The left ventricular internal cavity size was small. There is no left ventricular  hypertrophy. Left ventricular diastolic parameters were normal. Right Ventricle: The right ventricular size is normal. No increase in right ventricular wall thickness. Right ventricular systolic function is normal. Left Atrium: Left atrial size was normal in size. Right Atrium: Right atrial size was normal in size. Pericardium: There is no evidence of pericardial effusion. Mitral Valve: The mitral valve is normal in structure. Mild mitral valve regurgitation. MV peak gradient, 6.0 mmHg. The mean mitral valve gradient is 2.0 mmHg. Tricuspid Valve: The tricuspid valve is normal in structure. Tricuspid valve regurgitation is trivial. Aortic Valve: The aortic valve is normal in structure. Aortic valve regurgitation is not visualized. Aortic valve mean gradient measures 8.0 mmHg. Aortic valve peak gradient measures 12.5 mmHg. Aortic valve area, by VTI measures 1.55 cm. Pulmonic Valve: The pulmonic valve was normal in structure. Pulmonic valve regurgitation is not visualized. Aorta: The aortic root and ascending aorta are structurally normal, with no evidence of dilitation. IAS/Shunts: No atrial level shunt detected  by color flow Doppler.  LEFT VENTRICLE PLAX 2D LVIDd:         4.55 cm   Diastology LVIDs:         3.38 cm   LV e' medial:    9.79 cm/s LV PW:         1.27 cm   LV E/e' medial:  10.3 LV IVS:        0.81 cm   LV e' lateral:   7.29 cm/s LVOT diam:     1.90 cm   LV E/e' lateral: 13.9 LV SV:         55 LV SV Index:   23 LVOT Area:     2.84 cm  RIGHT VENTRICLE RV Basal diam:  4.94 cm RV Mid diam:    5.31 cm LEFT ATRIUM             Index        RIGHT ATRIUM           Index LA diam:        4.30 cm 1.83 cm/m   RA Area:     23.70 cm LA Vol (A2C):   72.9 ml 31.02 ml/m  RA Volume:   78.10 ml  33.23 ml/m LA Vol (A4C):   65.1 ml 27.70 ml/m LA Biplane Vol: 70.7 ml 30.08 ml/m  AORTIC VALVE                     PULMONIC VALVE AV Area (Vmax):    1.47 cm      PV Vmax:       0.81 m/s AV Area (Vmean):   1.38 cm      PV  Vmean:      60.700 cm/s AV Area (VTI):     1.55 cm      PV VTI:        0.176 m AV Vmax:           177.00 cm/s   PV Peak grad:  2.6 mmHg AV Vmean:          130.000 cm/s  PV Mean grad:  2.0 mmHg AV VTI:            0.355 m AV Peak Grad:      12.5 mmHg AV Mean Grad:      8.0 mmHg LVOT Vmax:         92.00 cm/s LVOT Vmean:        63.400 cm/s LVOT VTI:          0.194 m LVOT/AV VTI ratio: 0.55  AORTA Ao Root diam: 2.80 cm MITRAL VALVE MV Area (PHT): 2.48 cm     SHUNTS MV Area VTI:   1.41 cm     Systemic VTI:  0.19 m MV Peak grad:  6.0 mmHg     Systemic Diam: 1.90 cm MV Mean grad:  2.0 mmHg MV Vmax:       1.22 m/s MV Vmean:      69.2 cm/s MV Decel Time: 306 msec MV E velocity: 101.00 cm/s MV A velocity: 115.00 cm/s MV E/A ratio:  0.88 Serafina Royals MD Electronically signed by Serafina Royals MD Signature Date/Time: 09/20/2021/12:07:16 PM    Final      Assessment and Recommendation  75 y.o. female with known acute on chronic diastolic dysfunction congestive heart failure multifactorial in nature including possible atelectasis, hypoxia, acute kidney injury, and anemia without evidence of acute coronary syndrome slightly improved  Plan: 1.  Continue supportive care encephalopathy and possible infection with empiric antibiotics and oxygen supplementation 2.  Continue hypertension control with amlodipine and carvedilol.  Continue to avoid angiotensin receptor blockers and ACE inhibitors with current AKI 3.  Patient currently receiving intravenous Lasix which may be helping although some concerns for acute kidney injury which may require adjustments and or Lasix drip. Defer lasix dosing to nephrology at this time.  4.  No further cardiac diagnostics necessary at this time due to no evidence of acute coronary syndrome or changes in LV function by echocardiogram 5.  Further consideration of rehab facility after above  Signed, Jettie Booze, PA-C

## 2021-09-22 NOTE — Progress Notes (Addendum)
   Heart Failure Nurse Navigator Note  Met with patient today, she was lying quietly in bed in no acute distress.  States that she had a good night sleep and feels that her breathing is better.  No family members at the bedside.  By teach back method went over caring for herself when she returns home.  She did not need any reinforcement as she listed weighing daily and recording, what to report to physician as far as weight gains and changes in symptoms.  Also talked about the fluid restriction and sodium restriction.  She currently lives at home with her husband who has dementia.  She states that he cannot make decisions but continues to drive.  She is the main caregiver.  Had spoken with grandson yesterday who was very interested in having both of them come live with he and his fiance.  She said she would be willing to do it for short-term but was not interested in it as a long-term plan.  She was given out handout on methods to try for thirst.  Pricilla Riffle RN [facility]

## 2021-09-22 NOTE — Consult Note (Addendum)
PHARMACY NOTE:  ANTIMICROBIAL RENAL DOSAGE ADJUSTMENT  Current antimicrobial regimen includes a mismatch between antimicrobial dosage and estimated renal function.  As per policy approved by the Pharmacy & Therapeutics and Medical Executive Committees, the antimicrobial dosage will be adjusted accordingly.  Current antimicrobial dosage:  cefdinir 300 mg BID  Indication: PNA  Renal Function:  Estimated Creatinine Clearance: 17.8 mL/min (A) (by C-G formula based on SCr of 3.81 mg/dL (H)).    Antimicrobial dosage has been changed to:  cefdinir 300 mg daily  Additional comments:   Thank you for allowing pharmacy to be a part of this patient's care.  Oswald Hillock, [facility] 09/22/2021 2:02 PM

## 2021-09-22 NOTE — Plan of Care (Signed)

## 2021-09-22 NOTE — Progress Notes (Deleted)
Patient returned from HD.  This RN called to room by NT, bleeding noted from R groin femoral line. Pressure held and Dr. Wieting called and made aware.  Pressure held for half hour and dressing reinforced. Bleeding appears to have stopped at this moment. Orders received to give one time dose of desmopressin.  Will give when arrives from pharmacy.  Patient instructed to keep leg still. 

## 2021-09-22 NOTE — Progress Notes (Signed)
Occupational Therapy Treatment Patient Details Name: Julie [name] MRN: [id number] DOB: 23-Aug-1946 Today's Date: 09/22/2021   History of present illness Pt is a 75 y/o F with PMH: dCHF, stage IIIb CKD, chronic hypoxemic respiratory failure on 2 L/min oxygen at home, type II DM, hypertension, dyslipidemia, and osteoporosis. She presented to ED d/t AMS and SOB. Pt adm for acute on chronic diastolic CHF, acute on chronic hypoxemic respiratory failure and acute metabolic encephalopathy.  Elevated troponins were attributed to demand ischemia per cardiologist.   OT comments  [name] was seen for OT treatment on this date. Upon arrival to room pt seated on EOB, agreeable to tx. Pt requires MIN A + RW for ADL t/f improving to CGA with use of bed rail. SBA + RW for functional mobility and toothbrushing standing sinkside. MIN A return to bed, SpO2 stable t/o on 4L [place]. RN notified pt requesting pain meds for back pain. Pt making good progress toward goals. Pt continues to benefit from skilled OT services to maximize return to PLOF and minimize risk of future falls, injury, caregiver burden, and readmission. Will continue to follow POC. Discharge recommendation remains appropriate.     Recommendations for follow up therapy are one component of a multi-disciplinary discharge planning process, led by the attending physician.  Recommendations may be updated based on patient status, additional functional criteria and insurance authorization.    Follow Up Recommendations  Home health OT    Assistance Recommended at Discharge Set up Supervision/Assistance  Equipment Recommendations  BSC/3in1;Other (comment) (bariatric BSC and bariatric front-wheel walker.)    Recommendations for Other Services      Precautions / Restrictions Precautions Precautions: Fall Restrictions Weight Bearing Restrictions: No       Mobility Bed Mobility Overal bed mobility: Needs Assistance Bed Mobility: Sit to Supine        Sit to supine: Min assist        Transfers Overall transfer level: Needs assistance Equipment used: Rolling walker (2 wheels) Transfers: Sit to/from Stand Sit to Stand: Min assist                 Balance Overall balance assessment: Needs assistance Sitting-balance support: Feet supported Sitting balance-Leahy Scale: Good     Standing balance support: During functional activity;Single extremity supported Standing balance-Leahy Scale: Fair                             ADL either performed or assessed with clinical judgement   ADL Overall ADL's : Needs assistance/impaired                                       General ADL Comments: MIN A + RW for ADL t/f improving to CGA with use of bed rail. SBA + RW for functional mobility and toothbrushing standing sinkside.    Extremity/Trunk Assessment Upper Extremity Assessment Upper Extremity Assessment: Generalized weakness   Lower Extremity Assessment Lower Extremity Assessment: Generalized weakness         Cognition Arousal/Alertness: Awake/alert Behavior During Therapy: WFL for tasks assessed/performed Overall Cognitive Status: Within Functional Limits for tasks assessed  Exercises Exercises: Other exercises Other Exercises Other Exercises: Pt educated re; OT role, DME recs, home routines/modifications Other Exercises: grooming, sit>sup, sit<>stand x3, sitting/standing balance/tolerance   Shoulder Instructions       General Comments SpO2 stable t/o on 4L [place]    Pertinent Vitals/ Pain       Pain Assessment: No/denies pain   Frequency  Min 2X/week        Progress Toward Goals  OT Goals(current goals can now be found in the care plan section)  Progress towards OT goals: Progressing toward goals  Acute Rehab OT Goals Patient Stated Goal: to go home OT Goal Formulation: With patient Time For Goal  Achievement: 10/05/21 Potential to Achieve Goals: Good ADL Goals Pt Will Perform Grooming: with modified independence;standing Pt Will Perform Upper Body Dressing: with modified independence;sitting Pt Will Perform Lower Body Dressing: with supervision;with set-up;with adaptive equipment;sit to/from stand Pt Will Transfer to Toilet: with supervision;ambulating;grab bars Pt Will Perform Toileting - Clothing Manipulation and hygiene: with supervision;sit to/from stand  Plan Discharge plan remains appropriate;Frequency remains appropriate       AM-PAC OT "6 Clicks" Daily Activity     Outcome Measure   Help from another person eating meals?: None Help from another person taking care of personal grooming?: A Little Help from another person toileting, which includes using toliet, bedpan, or urinal?: A Little Help from another person bathing (including washing, rinsing, drying)?: A Lot Help from another person to put on and taking off regular upper body clothing?: A Little Help from another person to put on and taking off regular lower body clothing?: A Lot 6 Click Score: 17    End of Session Equipment Utilized During Treatment: Rolling walker (2 wheels);Oxygen  OT Visit Diagnosis: History of falling (Z91.81);Unsteadiness on feet (R26.81)   Activity Tolerance Patient tolerated treatment well   Patient Left in bed;with call bell/phone within reach;with family/visitor present   Nurse Communication Patient requests pain meds        Time: 4599-7741 OT Time Calculation (min): 16 min  Charges: OT General Charges $OT Visit: 1 Visit OT Treatments $Self Care/Home Management : 8-22 mins  Dessie Coma, M.S. OTR/L  09/22/21, 3:38 PM  ascom [phone]

## 2021-09-23 ENCOUNTER — Inpatient Hospital Stay: Payer: Medicare Other

## 2021-09-23 LAB — CULTURE, BLOOD (ROUTINE X 2): Special Requests: ADEQUATE

## 2021-09-23 LAB — BASIC METABOLIC PANEL
Anion gap: 9 (ref 5–15)
BUN: 91 mg/dL — ABNORMAL HIGH (ref 8–23)
CO2: 26 mmol/L (ref 22–32)
Calcium: 8.3 mg/dL — ABNORMAL LOW (ref 8.9–10.3)
Chloride: 98 mmol/L (ref 98–111)
Creatinine, Ser: 3.22 mg/dL — ABNORMAL HIGH (ref 0.44–1.00)
GFR, Estimated: 15 mL/min — ABNORMAL LOW (ref >60)
Glucose, Bld: 140 mg/dL — ABNORMAL HIGH (ref 70–99)
Potassium: 4.7 mmol/L (ref 3.5–5.1)
Sodium: 133 mmol/L — ABNORMAL LOW (ref 135–145)

## 2021-09-23 LAB — GLUCOSE, CAPILLARY
Glucose-Capillary: 132 mg/dL — ABNORMAL HIGH (ref 70–99)
Glucose-Capillary: 209 mg/dL — ABNORMAL HIGH (ref 70–99)
Glucose-Capillary: 190 mg/dL — ABNORMAL HIGH (ref 70–99)
Glucose-Capillary: 202 mg/dL — ABNORMAL HIGH (ref 70–99)

## 2021-09-23 NOTE — Plan of Care (Signed)

## 2021-09-23 NOTE — Progress Notes (Signed)
[facility]  ROUNDING NOTE   Subjective:   Julie [name] is a 75 year old female with past medical history of chronic diastolic heart failure, chronic respiratory failure on 2 L oxygen at home, diabetes, hypertension, dyslipidemia, osteoporosis, and CKD stage IIIb.  Patient presents to the emergency room with altered mental status and worsening shortness of breath.  She will be admitted for Elevated liver function tests [R79.89] NSTEMI (non-ST elevated myocardial infarction) (HCC) [I21.4] AKI (acute kidney injury) ([place]) [N17.9]  Patient is known to our clinic and sees Dr. Holley Raring.  Last appointment with Dr. Holley Raring was on 08/15/2021.   Update: Patient found resting in bed, in no acute distress, reports feeling better, denies worsening SOB, nausea, vomiting or anorexia. She still requires 3L supplemental O2 via [place], has significant bilateral lower extremity edema.  Objective:  Vital signs in last 24 hours:  Temp:  [97.6 F (36.4 C)-98.3 F (36.8 C)] 97.9 F (36.6 C) (12/10 1220) Pulse Rate:  [60-67] 67 (12/10 1220) Resp:  [16-18] 18 (12/10 1220) BP: (109-120)/(55-58) 120/56 (12/10 1220) SpO2:  [97 %-100 %] 97 % (12/10 1220) Weight:  [126.5 kg] 126.5 kg (12/10 0500)  Weight change: -0.137 kg Filed Weights   09/21/21 1443 09/22/21 0600 09/23/21 0500  Weight: 126.6 kg 129 kg 126.5 kg    Intake/Output: I/O last 3 completed shifts: In: 29 [P.O.:960] Out: 1800 [Urine:1800]   Intake/Output this shift:  Total I/O In: 240 [P.O.:240] Out: -   Physical Exam: General: In no acute distress  Head: Moist oral mucosal membranes  Eyes: Anicteric  Lungs:  Respirations symmetrical, normal effort, O2 3 L via [place],lungs clear  Heart: Regular rate and rhythm  Abdomen:  Soft, nontender, nondistended  Extremities:  2+ peripheral edema.  Neurologic: Awake, alert, oriented  Skin: No acute lesions or rashes    Basic Metabolic Panel: Recent Labs  Lab 09/19/21 1859  09/20/21 0451 09/21/21 0452 09/22/21 0559 09/23/21 0846  NA 134* 134* 128* 133* 133*  K 5.7* 5.1 4.8 4.9 4.7  CL 97* 100 97* 97* 98  CO2 24 24 23 26 26   GLUCOSE 182* 143* 125* 163* 140*  BUN 77* 82* 79* 93* 91*  CREATININE 4.68* 4.41* 3.95* 3.81* 3.22*  CALCIUM 8.8* 8.4* 7.7* 8.1* 8.3*     Liver Function Tests: Recent Labs  Lab 09/16/21 1737 09/19/21 1859 09/20/21 0451 09/21/21 0452 09/22/21 0559  AST 18 874* 485* 173* 66*  ALT 19 1,399* 1,046* 706* 515*  ALKPHOS 69 77 65 59 57  BILITOT 0.9 1.1 0.9 0.5 0.7  PROT 6.7 6.7 5.9* 5.5* 5.7*  ALBUMIN 3.6 3.6 3.1* 2.8* 2.9*    No results for input(s): LIPASE, AMYLASE in the last 168 hours. No results for input(s): AMMONIA in the last 168 hours.  CBC: Recent Labs  Lab 09/16/21 1737 09/19/21 1859 09/20/21 0451 09/21/21 0452  WBC 5.6 11.1* 9.8 6.5  NEUTROABS 4.4 9.7*  --   --   HGB 10.2* 10.6* 9.7* 9.0*  HCT 33.9* 34.9* 31.5* 29.5*  MCV 82.5 82.1 81.4 79.1*  PLT 170 145* 143* 128*     Cardiac Enzymes: Recent Labs  Lab 09/19/21 1859  CKTOTAL 487*     BNP: Invalid input(s): POCBNP  CBG: Recent Labs  Lab 09/22/21 1136 09/22/21 1644 09/22/21 2127 09/23/21 0758 09/23/21 1222  GLUCAP 176* 170* 171* 132* 209*     Microbiology: Results for orders placed or performed during the hospital encounter of 09/19/21  Resp Panel by RT-PCR (Flu A&B,  Covid) Nasopharyngeal Swab     Status: None   Collection Time: 09/19/21  6:59 PM   Specimen: Nasopharyngeal Swab; Nasopharyngeal(NP) swabs in vial transport medium  Result Value Ref Range Status   SARS Coronavirus 2 by RT PCR NEGATIVE NEGATIVE Final    Comment: (NOTE) SARS-CoV-2 target nucleic acids are NOT DETECTED.  The SARS-CoV-2 RNA is generally detectable in upper respiratory specimens during the acute phase of infection. The lowest concentration of SARS-CoV-2 viral copies this assay can detect is 138 copies/mL. A negative result does not preclude  SARS-Cov-2 infection and should not be used as the sole basis for treatment or other patient management decisions. A negative result may occur with  improper specimen collection/handling, submission of specimen other than nasopharyngeal swab, presence of viral mutation(s) within the areas targeted by this assay, and inadequate number of viral copies(<138 copies/mL). A negative result must be combined with clinical observations, patient history, and epidemiological information. The expected result is Negative.  Fact Sheet for Patients:  EntrepreneurPulse.com.au  Fact Sheet for Healthcare Providers:  IncredibleEmployment.be  This test is no t yet approved or cleared by the [place] FDA and  has been authorized for detection and/or diagnosis of SARS-CoV-2 by FDA under an Emergency Use Authorization (EUA). This EUA will remain  in effect (meaning this test can be used) for the duration of the COVID-19 declaration under Section 564(b)(1) of the Act, 21 U.S.C.section 360bbb-3(b)(1), unless the authorization is terminated  or revoked sooner.       Influenza A by PCR NEGATIVE NEGATIVE Final   Influenza B by PCR NEGATIVE NEGATIVE Final    Comment: (NOTE) The Xpert Xpress SARS-CoV-2/FLU/RSV plus assay is intended as an aid in the diagnosis of influenza from Nasopharyngeal swab specimens and should not be used as a sole basis for treatment. Nasal washings and aspirates are unacceptable for Xpert Xpress SARS-CoV-2/FLU/RSV testing.  Fact Sheet for Patients: EntrepreneurPulse.com.au  Fact Sheet for Healthcare Providers: IncredibleEmployment.be  This test is not yet approved or cleared by the [place] FDA and has been authorized for detection and/or diagnosis of SARS-CoV-2 by FDA under an Emergency Use Authorization (EUA). This EUA will remain in effect (meaning this test can be used) for the duration of  the COVID-19 declaration under Section 564(b)(1) of the Act, 21 U.S.C. section 360bbb-3(b)(1), unless the authorization is terminated or revoked.  Performed at [facility], [address] [place], [place] [address]   Blood culture (routine x 2)     Status: None (Preliminary result)   Collection Time: 09/19/21  8:43 PM   Specimen: BLOOD  Result Value Ref Range Status   Specimen Description BLOOD RAC  Final   Special Requests   Final    BOTTLES DRAWN AEROBIC AND ANAEROBIC Blood Culture adequate volume   Culture   Final    NO GROWTH 4 DAYS Performed at [facility], [address] [place], [place] [address]    Report Status PENDING  Incomplete  Blood culture (routine x 2)     Status: Abnormal   Collection Time: 09/19/21  8:43 PM   Specimen: BLOOD  Result Value Ref Range Status   Specimen Description   Final    BLOOD LAC Performed at [facility], [address], [place], [place] [address]    Special Requests   Final    BOTTLES DRAWN AEROBIC AND ANAEROBIC Blood Culture adequate volume Performed at [facility], [address] [place], [place] [address]  Culture  Setup Time   Final    Organism ID to follow GRAM POSITIVE COCCI IN BOTH AEROBIC AND ANAEROBIC BOTTLES CRITICAL RESULT CALLED TO, READ BACK BY AND VERIFIED WITH: SUSAN WATSON 1506 09/20/21 MU Performed at [facility], [address] [place], [place] [address]    Culture (A)  Final    STAPHYLOCOCCUS LUGDUNENSIS STAPHYLOCOCCUS EPIDERMIDIS THE SIGNIFICANCE OF ISOLATING THIS ORGANISM FROM A SINGLE SET OF BLOOD CULTURES WHEN MULTIPLE SETS ARE DRAWN IS UNCERTAIN. PLEASE NOTIFY THE MICROBIOLOGY DEPARTMENT WITHIN ONE WEEK IF SPECIATION AND SENSITIVITIES ARE REQUIRED. Performed at [facility], [place] [address] [place], [place] [address]    Report Status 09/23/2021 FINAL  Final   Organism ID, Bacteria STAPHYLOCOCCUS LUGDUNENSIS  Final      Susceptibility    Staphylococcus lugdunensis - MIC*    CIPROFLOXACIN <=0.5 SENSITIVE Sensitive     ERYTHROMYCIN <=0.25 SENSITIVE Sensitive     GENTAMICIN <=0.5 SENSITIVE Sensitive     OXACILLIN 0.5 SENSITIVE Sensitive     TETRACYCLINE <=1 SENSITIVE Sensitive     VANCOMYCIN <=0.5 SENSITIVE Sensitive     TRIMETH/SULFA <=10 SENSITIVE Sensitive     CLINDAMYCIN <=0.25 SENSITIVE Sensitive     RIFAMPIN <=0.5 SENSITIVE Sensitive     Inducible Clindamycin NEGATIVE Sensitive     * STAPHYLOCOCCUS LUGDUNENSIS  Blood Culture ID Panel (Reflexed)     Status: Abnormal   Collection Time: 09/19/21  8:43 PM  Result Value Ref Range Status   Enterococcus faecalis NOT DETECTED NOT DETECTED Final   Enterococcus Faecium NOT DETECTED NOT DETECTED Final   Listeria monocytogenes NOT DETECTED NOT DETECTED Final   Staphylococcus species DETECTED (A) NOT DETECTED Final    Comment: CRITICAL RESULT CALLED TO, READ BACK BY AND VERIFIED WITH: SUSAN WATSON 1506 09/20/21 MU    Staphylococcus aureus (BCID) NOT DETECTED NOT DETECTED Final   Staphylococcus epidermidis DETECTED (A) NOT DETECTED Final    Comment: Methicillin (oxacillin) resistant coagulase negative staphylococcus. Possible blood culture contaminant (unless isolated from more than one blood culture draw or clinical case suggests pathogenicity). No antibiotic treatment is indicated for blood  culture contaminants. CRITICAL RESULT CALLED TO, READ BACK BY AND VERIFIED WITH: SUSAN WATSON 1506 09/20/21 MU    Staphylococcus lugdunensis DETECTED (A) NOT DETECTED Final    Comment: Methicillin (oxacillin) resistant coagulase negative staphylococcus. Possible blood culture contaminant (unless isolated from more than one blood culture draw or clinical case suggests pathogenicity). No antibiotic treatment is indicated for blood  culture contaminants. CRITICAL RESULT CALLED TO, READ BACK BY AND VERIFIED WITH: [place] 8032 09/20/21 MU    Streptococcus species NOT DETECTED NOT DETECTED  Final   Streptococcus agalactiae NOT DETECTED NOT DETECTED Final   Streptococcus pneumoniae NOT DETECTED NOT DETECTED Final   Streptococcus pyogenes NOT DETECTED NOT DETECTED Final   A.calcoaceticus-baumannii NOT DETECTED NOT DETECTED Final   Bacteroides fragilis NOT DETECTED NOT DETECTED Final   Enterobacterales NOT DETECTED NOT DETECTED Final   Enterobacter cloacae complex NOT DETECTED NOT DETECTED Final   Escherichia coli NOT DETECTED NOT DETECTED Final   Klebsiella aerogenes NOT DETECTED NOT DETECTED Final   Klebsiella oxytoca NOT DETECTED NOT DETECTED Final   Klebsiella pneumoniae NOT DETECTED NOT DETECTED Final   Proteus species NOT DETECTED NOT DETECTED Final   Salmonella species NOT DETECTED NOT DETECTED Final   Serratia marcescens NOT DETECTED NOT DETECTED Final   Haemophilus influenzae NOT DETECTED NOT DETECTED Final   Neisseria meningitidis NOT DETECTED NOT DETECTED Final  Pseudomonas aeruginosa NOT DETECTED NOT DETECTED Final   Stenotrophomonas maltophilia NOT DETECTED NOT DETECTED Final   Candida albicans NOT DETECTED NOT DETECTED Final   Candida auris NOT DETECTED NOT DETECTED Final   Candida glabrata NOT DETECTED NOT DETECTED Final   Candida krusei NOT DETECTED NOT DETECTED Final   Candida parapsilosis NOT DETECTED NOT DETECTED Final   Candida tropicalis NOT DETECTED NOT DETECTED Final   Cryptococcus neoformans/gattii NOT DETECTED NOT DETECTED Final   Methicillin resistance mecA/C DETECTED (A) NOT DETECTED Final    Comment: CRITICAL RESULT CALLED TO, READ BACK BY AND VERIFIED WITH: SUSAN WATSON 1506 09/20/21 MU Performed at [facility], [address] [place], [place] [address]     Coagulation Studies: No results for input(s): LABPROT, INR in the last 72 hours.   Urinalysis: No results for input(s): COLORURINE, LABSPEC, PHURINE, GLUCOSEU, HGBUR, BILIRUBINUR, KETONESUR, PROTEINUR, UROBILINOGEN, NITRITE, LEUKOCYTESUR in the last 72 hours.  Invalid  input(s): APPERANCEUR     Imaging: DG Lumbar Spine 2-3 Views  Result Date: 09/23/2021 CLINICAL DATA:  Back pain no injury EXAM: LUMBAR SPINE - 2-3 VIEW COMPARISON:  None. FINDINGS: Grade 2 anterolisthesis L4-5 with advanced disc degeneration and bilateral pars defects. Grade 2 anterolisthesis L5-S1 with disc degeneration. No definite pars defects. This may be due to facet degeneration. Negative for acute fracture. Remaining disc spaces intact. SI joints intact. IMPRESSION: Grade 2 anterolisthesis L4-5 and L5-S1. Bilateral pars defects of L4. Electronically Signed   By: Franchot Gallo M.D.   On: 09/23/2021 11:43     Medications:      amLODipine  10 mg Oral Daily   carvedilol  12.5 mg Oral BID WC   cefdinir  300 mg Oral Daily   Chlorhexidine Gluconate Cloth  6 each Topical Daily   cholecalciferol  1,000 Units Oral Daily   furosemide  60 mg Intravenous BID   heparin injection (subcutaneous)  5,000 Units Subcutaneous Q8H   insulin aspart  0-9 Units Subcutaneous TID PC & HS   multivitamin with minerals  1 tablet Oral Daily   omega-3 acid ethyl esters  1 g Oral Daily   acetaminophen **OR** acetaminophen, magnesium hydroxide, morphine injection, nitroGLYCERIN, ondansetron, traZODone  Assessment/ Plan:  Ms. Julie [name] is a 75 y.o.  female with past medical history of chronic diastolic heart failure, chronic respiratory failure on 2 L oxygen at home, diabetes, hypertension, dyslipidemia, osteoporosis, and CKD stage IIIb.  Patient presents to the emergency room with altered mental status and worsening shortness of breath.  She will be admitted for Elevated liver function tests [R79.89] NSTEMI (non-ST elevated myocardial infarction) ([place]) [I21.4] AKI (acute kidney injury) ([place]) [N17.9]  Acute Kidney Injury on chronic kidney disease stage 3b with baseline creatinine 1.74 and GFR of 31 on 08/15/21.  Acute kidney injury likely secondary to cardiorenal syndrome Lisinopril held.  No IV  contrast exposure.  Renal ultrasound on 09/20/2021 Unremarkable   Lab Results  Component Value Date   CREATININE 3.22 (H) 09/23/2021   CREATININE 3.81 (H) 09/22/2021   CREATININE 3.95 (H) 09/21/2021    Intake/Output Summary (Last 24 hours) at 09/23/2021 1508 Last data filed at 09/23/2021 1411 Gross per 24 hour  Intake 720 ml  Output 700 ml  Net 20 ml   Continue Furosemide 60 mg IV  bid  Renal function improving gradually No acute indication for dialysis   2. Anemia of chronic kidney disease: Normocytic Lab Results  Component Value Date   HGB 9.0 (L) 09/21/2021  We will continue to monitor CBCs  3. Secondary Hyperparathyroidism: with outpatient labs: PTH 84, phosphorus 4.6, calcium 8.9 on 08/15/21.   Lab Results  Component Value Date   CALCIUM 8.3 (L) 09/23/2021  will continue to monitoring bone mineral metabolism parameters  4.  Hypertension with chronic kidney disease.  Home regimen includes amlodipine, carvedilol, furosemide, and lisinopril.  Lisinopril currently on hold.  Blood pressure readings stay within acceptable range, 120/56 today  5. Diabetes mellitus type II with chronic kidney disease Lab Results  Component Value Date   HGBA1C 5.4 07/24/2021   Continue diabetic management per primary team     LOS: 4 Rolla Servidio 12/10/20223:08 PM

## 2021-09-23 NOTE — Progress Notes (Signed)
[facility] Cardiology  SUBJECTIVE: Patient laying in bed, reports proved breathing   Vitals:   09/23/21 0001 09/23/21 0429 09/23/21 0500 09/23/21 0756  BP: (!) 115/57 (!) 115/55  (!) 120/58  Pulse: 61 60  63  Resp: 17 18  16   Temp: 97.8 F (36.6 C) 97.6 F (36.4 C)  97.8 F (36.6 C)  TempSrc: Oral   Oral  SpO2: 97% 100%  97%  Weight:   126.5 kg   Height:         Intake/Output Summary (Last 24 hours) at 09/23/2021 0843 Last data filed at 09/23/2021 6503 Gross per 24 hour  Intake 960 ml  Output 1100 ml  Net -140 ml      PHYSICAL EXAM  General: Well developed, well nourished, in no acute distress HEENT:  Normocephalic and atramatic Neck:  No JVD.  Lungs: Clear bilaterally to auscultation and percussion. Heart: HRRR . Normal S1 and S2 without gallops or murmurs.  Abdomen: Bowel sounds are positive, abdomen soft and non-tender  Msk:  Back normal, normal gait. Normal strength and tone for age. Extremities: No clubbing, cyanosis or edema.   Neuro: Alert and oriented X 3. Psych:  Good affect, responds appropriately   LABS: Basic Metabolic Panel: Recent Labs    09/21/21 0452 09/22/21 0559  NA 128* 133*  K 4.8 4.9  CL 97* 97*  CO2 23 26  GLUCOSE 125* 163*  BUN 79* 93*  CREATININE 3.95* 3.81*  CALCIUM 7.7* 8.1*   Liver Function Tests: Recent Labs    09/21/21 0452 09/22/21 0559  AST 173* 66*  ALT 706* 515*  ALKPHOS 59 57  BILITOT 0.5 0.7  PROT 5.5* 5.7*  ALBUMIN 2.8* 2.9*   No results for input(s): LIPASE, AMYLASE in the last 72 hours. CBC: Recent Labs    09/21/21 0452  WBC 6.5  HGB 9.0*  HCT 29.5*  MCV 79.1*  PLT 128*   Cardiac Enzymes: No results for input(s): CKTOTAL, CKMB, CKMBINDEX, TROPONINI in the last 72 hours. BNP: Invalid input(s): POCBNP D-Dimer: No results for input(s): DDIMER in the last 72 hours. Hemoglobin A1C: No results for input(s): HGBA1C in the last 72 hours. Fasting Lipid Panel: No results for input(s): CHOL, HDL, LDLCALC,  TRIG, CHOLHDL, LDLDIRECT in the last 72 hours. Thyroid Function Tests: No results for input(s): TSH, T4TOTAL, T3FREE, THYROIDAB in the last 72 hours.  Invalid input(s): FREET3 Anemia Panel: No results for input(s): VITAMINB12, FOLATE, FERRITIN, TIBC, IRON, RETICCTPCT in the last 72 hours.  No results found.   Echo LVEF 65 to 70% 09/20/2021  TELEMETRY: Sinus rhythm 64 bpm:  ASSESSMENT AND PLAN:  Principal Problem:   Demand ischemia (HCC) Active Problems:   Acute on chronic respiratory failure with hypoxia (HCC)   Acute on chronic diastolic CHF (congestive heart failure) ([place])    1.  Acute on chronic diastolic congestive heart failure, normal left ventricular function by 2D echocardiogram, on furosemide 60 mg IV twice daily 2.  Essential hypertension, on amlodipine and carvedilol 3.  Acute kidney injury with underlying CKD stage IIIb, followed by nephrology/Dr. Holley Raring, lisinopril held 4.  Elevated troponin (903, 832), in the absence of chest pain, likely demand supply ischemia 5.  Anemia, hemoglobin and hematocrit 9.7 and 31.5, respectively  Recommendations  1.  Agree with current therapy 2.  Continue diuresis 3.  Carefully monitor renal status 4.  Continue to hold lisinopril for now 5.  Defer further cardiac diagnostics at this time 6.  Follow-up with Dr. Nehemiah Massed 1  to 2 weeks after discharge     Isaias Cowman, MD, PhD, [facility] 09/23/2021 8:43 AM

## 2021-09-23 NOTE — Progress Notes (Addendum)
Progress Note    Julie [name]  ZOX:[id number] DOB: 01-12-1946  DOA: 09/19/2021 PCP: Gladstone Lighter, MD      Brief Narrative:    Medical records reviewed and are as summarized below:  AZHARIA [name] is a 75 y.o. female with medical history significant for chronic diastolic CHF, stage IIIb CKD, chronic hypoxemic respiratory failure on 2 L/min oxygen at home, type II DM, hypertension, dyslipidemia, osteoporosis.  She presented to the hospital because of altered mental status shortness of breath that is worse with exertion, orthopnea and lower extremity edema.  History is significant for a fall at home about 2 days prior to admission.  Reportedly oxygen saturation was 68% on room air when EMS picked her up.  Her troponins evaluated ([phone]).  She was admitted to the hospital for acute on chronic diastolic CHF, acute on chronic hypoxemic respiratory failure and acute metabolic encephalopathy.  Elevated troponins were attributed to demand ischemia per cardiologist.  She was also found to have AKI and elevated liver enzymes probably from acute CHF.    Assessment/Plan:   Principal Problem:   Demand ischemia (HCC) Active Problems:   Acute on chronic respiratory failure with hypoxia (HCC)   Acute on chronic diastolic CHF (congestive heart failure) (HCC)   Body mass index is 46.39 kg/m.  (Morbid obesity)   Acute on chronic diastolic CHF: Continue IV Lasix.  Monitor BMP, urine output and daily weight.  Discontinue Foley catheter.  2D echo showed EF estimated at 65 to 70%, normal LV diastolic parameters.  BNP was 848.5.  Elevated troponins: Probably from demand ischemia.  No chest pain. No plan for left heart cath  Acute on chronic hypoxemic respiratory failure: She is on 3.5 L/min oxygen via nasal cannula.  She uses 2 L/min oxygen at home  Left lower lobe consolidation concerning for atelectasis or pneumonia: Blood culture showed staph lugdunensis and staph epidermidis  which are likely contaminants.  Continue Omnicef  AKI on CKD stage IIIb, hyperkalemia: Improving.  Continue IV Lasix.  Baseline creatinine is between 1.5-1.9.  Monitor BMP follow-up with nephrologist for further recommendations.  Hyponatremia: Sodium level is stable but overall improved.  Monitor BMP.  She has been placed on fluid restriction up to 1.2 L/day.  Elevated liver enzymes: Liver enzymes have been improving.  This is probably due to acute CHF.  She has cholelithiasis noted on CT chest.  Repeat liver enzymes.  Acute metabolic encephalopathy: Improved  Type II DM: Hold glipizide.  Use NovoLog as needed for hyperglycemia.  Low back pain: X-ray of the lumbar spine showed grade 2 anterolisthesis L4-5 and L5-S1, Bilateral pars defects of L4.  Outpatient follow-up with PCP orthopedic surgeon.  Generalized weakness/fall at home: PT and OT recommend home health therapy   Diet Order             Diet Heart Room service appropriate? Yes; Fluid consistency: Thin; Fluid restriction: 1200 mL Fluid  Diet effective now                      Consultants: Cardiologist Nephrologist  Procedures: None    Medications:    amLODipine  10 mg Oral Daily   carvedilol  12.5 mg Oral BID WC   cefdinir  300 mg Oral Daily   Chlorhexidine Gluconate Cloth  6 each Topical Daily   cholecalciferol  1,000 Units Oral Daily   furosemide  60 mg Intravenous BID   heparin injection (subcutaneous)  5,000  Units Subcutaneous Q8H   insulin aspart  0-9 Units Subcutaneous TID PC & HS   multivitamin with minerals  1 tablet Oral Daily   omega-3 acid ethyl esters  1 g Oral Daily   Continuous Infusions:     Anti-infectives (From admission, onward)    Start     Dose/Rate Route Frequency Ordered Stop   09/22/21 2200  cefdinir (OMNICEF) capsule 300 mg        300 mg Oral Daily 09/22/21 1358 09/24/21 2159   09/21/21 1800  azithromycin (ZITHROMAX) tablet 500 mg  Status:  Discontinued        500 mg Oral  Every evening 09/21/21 1536 09/23/21 0800   09/19/21 2030  cefTRIAXone (ROCEPHIN) 2 g in sodium chloride 0.9 % 100 mL IVPB  Status:  Discontinued        2 g 200 mL/hr over 30 Minutes Intravenous Every 24 hours 09/19/21 2028 09/22/21 1358   09/19/21 2030  azithromycin (ZITHROMAX) 500 mg in sodium chloride 0.9 % 250 mL IVPB  Status:  Discontinued        500 mg 250 mL/hr over 60 Minutes Intravenous Every 24 hours 09/19/21 2028 09/21/21 1536              Family Communication/Anticipated D/C date and plan/Code Status   DVT prophylaxis: heparin injection 5,000 Units Start: 09/21/21 1630     Code Status: DNR  Family Communication: Geni Bers (patient's grandson's girlfriend, at the bedside (with patient's permission) Disposition Plan: Possible discharge to home in 1 to 2 days   Status is: Inpatient  Remains inpatient appropriate because: Acute heart failure           Subjective:   C/o low back pain.  She reports a history of sciatica about 2 to 3 years ago.  Breathing is better.  Geni Bers, family friend, was at the bedside.  Objective:    Vitals:   09/23/21 0429 09/23/21 0500 09/23/21 0756 09/23/21 1220  BP: (!) 115/55  (!) 120/58 (!) 120/56  Pulse: 60  63 67  Resp: 18  16 18   Temp: 97.6 F (36.4 C)  97.8 F (36.6 C) 97.9 F (36.6 C)  TempSrc:   Oral Oral  SpO2: 100%  97% 97%  Weight:  126.5 kg    Height:       No data found.   Intake/Output Summary (Last 24 hours) at 09/23/2021 1410 Last data filed at 09/23/2021 1024 Gross per 24 hour  Intake 960 ml  Output 1100 ml  Net -140 ml   Filed Weights   09/21/21 1443 09/22/21 0600 09/23/21 0500  Weight: 126.6 kg 129 kg 126.5 kg    Exam:  GEN: NAD SKIN: No rash EYES: EOMI ENT: MMM CV: RRR PULM: CTA B ABD: soft, obese, NT, +BS CNS: AAO x 3, non focal EXT: Bilateral leg edema is improving MSK: Mild left lumbar paraspinal tenderness          Data Reviewed:   I have personally  reviewed following labs and imaging studies:  Labs: Labs show the following:   Basic Metabolic Panel: Recent Labs  Lab 09/19/21 1859 09/20/21 0451 09/21/21 0452 09/22/21 0559 09/23/21 0846  NA 134* 134* 128* 133* 133*  K 5.7* 5.1 4.8 4.9 4.7  CL 97* 100 97* 97* 98  CO2 24 24 23 26 26   GLUCOSE 182* 143* 125* 163* 140*  BUN 77* 82* 79* 93* 91*  CREATININE 4.68* 4.41* 3.95* 3.81* 3.22*  CALCIUM 8.8* 8.4* 7.7* 8.1* 8.3*  GFR Estimated Creatinine Clearance: 20.8 mL/min (A) (by C-G formula based on SCr of 3.22 mg/dL (H)). Liver Function Tests: Recent Labs  Lab 09/16/21 1737 09/19/21 1859 09/20/21 0451 09/21/21 0452 09/22/21 0559  AST 18 874* 485* 173* 66*  ALT 19 1,399* 1,046* 706* 515*  ALKPHOS 69 77 65 59 57  BILITOT 0.9 1.1 0.9 0.5 0.7  PROT 6.7 6.7 5.9* 5.5* 5.7*  ALBUMIN 3.6 3.6 3.1* 2.8* 2.9*   No results for input(s): LIPASE, AMYLASE in the last 168 hours. No results for input(s): AMMONIA in the last 168 hours. Coagulation profile Recent Labs  Lab 09/19/21 1859  INR 1.3*    CBC: Recent Labs  Lab 09/16/21 1737 09/19/21 1859 09/20/21 0451 09/21/21 0452  WBC 5.6 11.1* 9.8 6.5  NEUTROABS 4.4 9.7*  --   --   HGB 10.2* 10.6* 9.7* 9.0*  HCT 33.9* 34.9* 31.5* 29.5*  MCV 82.5 82.1 81.4 79.1*  PLT 170 145* 143* 128*   Cardiac Enzymes: Recent Labs  Lab 09/19/21 1859  CKTOTAL 487*   BNP (last 3 results) No results for input(s): PROBNP in the last 8760 hours. CBG: Recent Labs  Lab 09/22/21 1136 09/22/21 1644 09/22/21 2127 09/23/21 0758 09/23/21 1222  GLUCAP 176* 170* 171* 132* 209*   D-Dimer: No results for input(s): DDIMER in the last 72 hours. Hgb A1c: No results for input(s): HGBA1C in the last 72 hours. Lipid Profile: No results for input(s): CHOL, HDL, LDLCALC, TRIG, CHOLHDL, LDLDIRECT in the last 72 hours. Thyroid function studies: No results for input(s): TSH, T4TOTAL, T3FREE, THYROIDAB in the last 72 hours.  Invalid input(s):  FREET3 Anemia work up: No results for input(s): VITAMINB12, FOLATE, FERRITIN, TIBC, IRON, RETICCTPCT in the last 72 hours. Sepsis Labs: Recent Labs  Lab 09/16/21 1737 09/19/21 1859 09/19/21 2043 09/20/21 0451 09/21/21 0452  WBC 5.6 11.1*  --  9.8 6.5  LATICACIDVEN  --   --  0.9 0.7  --     Microbiology Recent Results (from the past 240 hour(s))  Resp Panel by RT-PCR (Flu A&B, Covid) Nasopharyngeal Swab     Status: None   Collection Time: 09/16/21  7:45 PM   Specimen: Nasopharyngeal Swab; Nasopharyngeal(NP) swabs in vial transport medium  Result Value Ref Range Status   SARS Coronavirus 2 by RT PCR NEGATIVE NEGATIVE Final    Comment: (NOTE) SARS-CoV-2 target nucleic acids are NOT DETECTED.  The SARS-CoV-2 RNA is generally detectable in upper respiratory specimens during the acute phase of infection. The lowest concentration of SARS-CoV-2 viral copies this assay can detect is 138 copies/mL. A negative result does not preclude SARS-Cov-2 infection and should not be used as the sole basis for treatment or other patient management decisions. A negative result may occur with  improper specimen collection/handling, submission of specimen other than nasopharyngeal swab, presence of viral mutation(s) within the areas targeted by this assay, and inadequate number of viral copies(<138 copies/mL). A negative result must be combined with clinical observations, patient history, and epidemiological information. The expected result is Negative.  Fact Sheet for Patients:  EntrepreneurPulse.com.au  Fact Sheet for Healthcare Providers:  IncredibleEmployment.be  This test is no t yet approved or cleared by the [place] FDA and  has been authorized for detection and/or diagnosis of SARS-CoV-2 by FDA under an Emergency Use Authorization (EUA). This EUA will remain  in effect (meaning this test can be used) for the duration of the COVID-19 declaration  under Section 564(b)(1) of the Act, 21 U.S.C.section 360bbb-3(b)(1), unless  the authorization is terminated  or revoked sooner.       Influenza A by PCR NEGATIVE NEGATIVE Final   Influenza B by PCR NEGATIVE NEGATIVE Final    Comment: (NOTE) The Xpert Xpress SARS-CoV-2/FLU/RSV plus assay is intended as an aid in the diagnosis of influenza from Nasopharyngeal swab specimens and should not be used as a sole basis for treatment. Nasal washings and aspirates are unacceptable for Xpert Xpress SARS-CoV-2/FLU/RSV testing.  Fact Sheet for Patients: EntrepreneurPulse.com.au  Fact Sheet for Healthcare Providers: IncredibleEmployment.be  This test is not yet approved or cleared by the [place] FDA and has been authorized for detection and/or diagnosis of SARS-CoV-2 by FDA under an Emergency Use Authorization (EUA). This EUA will remain in effect (meaning this test can be used) for the duration of the COVID-19 declaration under Section 564(b)(1) of the Act, 21 U.S.C. section 360bbb-3(b)(1), unless the authorization is terminated or revoked.  Performed at [facility], [address] [place], [place] [address]   Resp Panel by RT-PCR (Flu A&B, Covid) Nasopharyngeal Swab     Status: None   Collection Time: 09/19/21  6:59 PM   Specimen: Nasopharyngeal Swab; Nasopharyngeal(NP) swabs in vial transport medium  Result Value Ref Range Status   SARS Coronavirus 2 by RT PCR NEGATIVE NEGATIVE Final    Comment: (NOTE) SARS-CoV-2 target nucleic acids are NOT DETECTED.  The SARS-CoV-2 RNA is generally detectable in upper respiratory specimens during the acute phase of infection. The lowest concentration of SARS-CoV-2 viral copies this assay can detect is 138 copies/mL. A negative result does not preclude SARS-Cov-2 infection and should not be used as the sole basis for treatment or other patient management decisions. A negative result may occur with   improper specimen collection/handling, submission of specimen other than nasopharyngeal swab, presence of viral mutation(s) within the areas targeted by this assay, and inadequate number of viral copies(<138 copies/mL). A negative result must be combined with clinical observations, patient history, and epidemiological information. The expected result is Negative.  Fact Sheet for Patients:  EntrepreneurPulse.com.au  Fact Sheet for Healthcare Providers:  IncredibleEmployment.be  This test is no t yet approved or cleared by the [place] FDA and  has been authorized for detection and/or diagnosis of SARS-CoV-2 by FDA under an Emergency Use Authorization (EUA). This EUA will remain  in effect (meaning this test can be used) for the duration of the COVID-19 declaration under Section 564(b)(1) of the Act, 21 U.S.C.section 360bbb-3(b)(1), unless the authorization is terminated  or revoked sooner.       Influenza A by PCR NEGATIVE NEGATIVE Final   Influenza B by PCR NEGATIVE NEGATIVE Final    Comment: (NOTE) The Xpert Xpress SARS-CoV-2/FLU/RSV plus assay is intended as an aid in the diagnosis of influenza from Nasopharyngeal swab specimens and should not be used as a sole basis for treatment. Nasal washings and aspirates are unacceptable for Xpert Xpress SARS-CoV-2/FLU/RSV testing.  Fact Sheet for Patients: EntrepreneurPulse.com.au  Fact Sheet for Healthcare Providers: IncredibleEmployment.be  This test is not yet approved or cleared by the [place] FDA and has been authorized for detection and/or diagnosis of SARS-CoV-2 by FDA under an Emergency Use Authorization (EUA). This EUA will remain in effect (meaning this test can be used) for the duration of the COVID-19 declaration under Section 564(b)(1) of the Act, 21 U.S.C. section 360bbb-3(b)(1), unless the authorization is terminated  or revoked.  Performed at [facility], [address], [place], [place] [address]   Blood  culture (routine x 2)     Status: None (Preliminary result)   Collection Time: 09/19/21  8:43 PM   Specimen: BLOOD  Result Value Ref Range Status   Specimen Description BLOOD RAC  Final   Special Requests   Final    BOTTLES DRAWN AEROBIC AND ANAEROBIC Blood Culture adequate volume   Culture   Final    NO GROWTH 4 DAYS Performed at [facility], [address] [place], [place] [address]    Report Status PENDING  Incomplete  Blood culture (routine x 2)     Status: Abnormal   Collection Time: 09/19/21  8:43 PM   Specimen: BLOOD  Result Value Ref Range Status   Specimen Description   Final    BLOOD LAC Performed at [facility], [address] [place], [place] [address]    Special Requests   Final    BOTTLES DRAWN AEROBIC AND ANAEROBIC Blood Culture adequate volume Performed at [facility], [address] [place], [place] [address]    Culture  Setup Time   Final    Organism ID to follow [place] IN BOTH AEROBIC AND ANAEROBIC BOTTLES CRITICAL RESULT CALLED TO, READ BACK BY AND VERIFIED WITH: SUSAN WATSON 1506 09/20/21 MU Performed at [facility], [address] [place], [place] [address]    Culture (A)  Final    STAPHYLOCOCCUS LUGDUNENSIS STAPHYLOCOCCUS EPIDERMIDIS THE SIGNIFICANCE OF ISOLATING THIS ORGANISM FROM A SINGLE SET OF BLOOD CULTURES WHEN MULTIPLE SETS ARE DRAWN IS UNCERTAIN. PLEASE NOTIFY THE MICROBIOLOGY DEPARTMENT WITHIN ONE WEEK IF SPECIATION AND SENSITIVITIES ARE REQUIRED. Performed at [facility], [place] [address], [place],  [address]    Report Status 09/23/2021 FINAL  Final   Organism ID, Bacteria STAPHYLOCOCCUS LUGDUNENSIS  Final      Susceptibility   Staphylococcus lugdunensis - MIC*    CIPROFLOXACIN <=0.5 SENSITIVE Sensitive     ERYTHROMYCIN <=0.25 SENSITIVE Sensitive     GENTAMICIN  <=0.5 SENSITIVE Sensitive     OXACILLIN 0.5 SENSITIVE Sensitive     TETRACYCLINE <=1 SENSITIVE Sensitive     VANCOMYCIN <=0.5 SENSITIVE Sensitive     TRIMETH/SULFA <=10 SENSITIVE Sensitive     CLINDAMYCIN <=0.25 SENSITIVE Sensitive     RIFAMPIN <=0.5 SENSITIVE Sensitive     Inducible Clindamycin NEGATIVE Sensitive     * STAPHYLOCOCCUS LUGDUNENSIS  Blood Culture ID Panel (Reflexed)     Status: Abnormal   Collection Time: 09/19/21  8:43 PM  Result Value Ref Range Status   Enterococcus faecalis NOT DETECTED NOT DETECTED Final   Enterococcus Faecium NOT DETECTED NOT DETECTED Final   Listeria monocytogenes NOT DETECTED NOT DETECTED Final   Staphylococcus species DETECTED (A) NOT DETECTED Final    Comment: CRITICAL RESULT CALLED TO, READ BACK BY AND VERIFIED WITH: SUSAN WATSON 1506 09/20/21 MU    Staphylococcus aureus (BCID) NOT DETECTED NOT DETECTED Final   Staphylococcus epidermidis DETECTED (A) NOT DETECTED Final    Comment: Methicillin (oxacillin) resistant coagulase negative staphylococcus. Possible blood culture contaminant (unless isolated from more than one blood culture draw or clinical case suggests pathogenicity). No antibiotic treatment is indicated for blood  culture contaminants. CRITICAL RESULT CALLED TO, READ BACK BY AND VERIFIED WITH: SUSAN WATSON 1506 09/20/21 MU    Staphylococcus lugdunensis DETECTED (A) NOT DETECTED Final    Comment: Methicillin (oxacillin) resistant coagulase negative staphylococcus. Possible blood culture contaminant (unless isolated from more than one blood culture draw or clinical case suggests pathogenicity). No  antibiotic treatment is indicated for blood  culture contaminants. CRITICAL RESULT CALLED TO, READ BACK BY AND VERIFIED WITH: [place] 0865 09/20/21 MU    Streptococcus species NOT DETECTED NOT DETECTED Final   Streptococcus agalactiae NOT DETECTED NOT DETECTED Final   Streptococcus pneumoniae NOT DETECTED NOT DETECTED Final    Streptococcus pyogenes NOT DETECTED NOT DETECTED Final   A.calcoaceticus-baumannii NOT DETECTED NOT DETECTED Final   Bacteroides fragilis NOT DETECTED NOT DETECTED Final   Enterobacterales NOT DETECTED NOT DETECTED Final   Enterobacter cloacae complex NOT DETECTED NOT DETECTED Final   Escherichia coli NOT DETECTED NOT DETECTED Final   Klebsiella aerogenes NOT DETECTED NOT DETECTED Final   Klebsiella oxytoca NOT DETECTED NOT DETECTED Final   Klebsiella pneumoniae NOT DETECTED NOT DETECTED Final   Proteus species NOT DETECTED NOT DETECTED Final   Salmonella species NOT DETECTED NOT DETECTED Final   Serratia marcescens NOT DETECTED NOT DETECTED Final   Haemophilus influenzae NOT DETECTED NOT DETECTED Final   Neisseria meningitidis NOT DETECTED NOT DETECTED Final   Pseudomonas aeruginosa NOT DETECTED NOT DETECTED Final   Stenotrophomonas maltophilia NOT DETECTED NOT DETECTED Final   Candida albicans NOT DETECTED NOT DETECTED Final   Candida auris NOT DETECTED NOT DETECTED Final   Candida glabrata NOT DETECTED NOT DETECTED Final   Candida krusei NOT DETECTED NOT DETECTED Final   Candida parapsilosis NOT DETECTED NOT DETECTED Final   Candida tropicalis NOT DETECTED NOT DETECTED Final   Cryptococcus neoformans/gattii NOT DETECTED NOT DETECTED Final   Methicillin resistance mecA/C DETECTED (A) NOT DETECTED Final    Comment: CRITICAL RESULT CALLED TO, READ BACK BY AND VERIFIED WITH: SUSAN WATSON 1506 09/20/21 MU Performed at [facility], [address] [place], [place] [address]     Procedures and diagnostic studies:  DG Lumbar Spine 2-3 Views  Result Date: 09/23/2021 CLINICAL DATA:  Back pain no injury EXAM: LUMBAR SPINE - 2-3 VIEW COMPARISON:  None. FINDINGS: Grade 2 anterolisthesis L4-5 with advanced disc degeneration and bilateral pars defects. Grade 2 anterolisthesis L5-S1 with disc degeneration. No definite pars defects. This may be due to facet degeneration. Negative for  acute fracture. Remaining disc spaces intact. SI joints intact. IMPRESSION: Grade 2 anterolisthesis L4-5 and L5-S1. Bilateral pars defects of L4. Electronically Signed   By: Franchot Gallo M.D.   On: 09/23/2021 11:43               LOS: 4 days   Donella Pascarella  [facility]   Pager on [email or web address]. If 7PM-7AM, please contact night-coverage at [email or web address]     09/23/2021, 2:10 PM

## 2021-09-24 LAB — GLUCOSE, CAPILLARY
Glucose-Capillary: 155 mg/dL — ABNORMAL HIGH (ref 70–99)
Glucose-Capillary: 258 mg/dL — ABNORMAL HIGH (ref 70–99)
Glucose-Capillary: 214 mg/dL — ABNORMAL HIGH (ref 70–99)
Glucose-Capillary: 167 mg/dL — ABNORMAL HIGH (ref 70–99)

## 2021-09-24 LAB — COMPREHENSIVE METABOLIC PANEL
ALT: 244 U/L — ABNORMAL HIGH (ref 0–44)
AST: 27 U/L (ref 15–41)
Albumin: 2.9 g/dL — ABNORMAL LOW (ref 3.5–5.0)
Alkaline Phosphatase: 60 U/L (ref 38–126)
Anion gap: 10 (ref 5–15)
BUN: 93 mg/dL — ABNORMAL HIGH (ref 8–23)
CO2: 27 mmol/L (ref 22–32)
Calcium: 8.5 mg/dL — ABNORMAL LOW (ref 8.9–10.3)
Chloride: 98 mmol/L (ref 98–111)
Creatinine, Ser: 2.56 mg/dL — ABNORMAL HIGH (ref 0.44–1.00)
GFR, Estimated: 19 mL/min — ABNORMAL LOW (ref >60)
Glucose, Bld: 150 mg/dL — ABNORMAL HIGH (ref 70–99)
Potassium: 4.5 mmol/L (ref 3.5–5.1)
Sodium: 135 mmol/L (ref 135–145)
Total Bilirubin: 0.8 mg/dL (ref 0.3–1.2)
Total Protein: 5.4 g/dL — ABNORMAL LOW (ref 6.5–8.1)

## 2021-09-24 LAB — CULTURE, BLOOD (ROUTINE X 2)
Culture: NO GROWTH
Special Requests: ADEQUATE

## 2021-09-24 MED ORDER — INSULIN ASPART 100 UNIT/ML IJ SOLN
0.0000 [IU] | Freq: Three times a day (TID) | INTRAMUSCULAR | Status: DC
  Administered 2021-09-24: 5 [IU] via SUBCUTANEOUS
  Administered 2021-09-25: 3 [IU] via SUBCUTANEOUS
  Administered 2021-09-25: 5 [IU] via SUBCUTANEOUS
  Filled 2021-09-24 (×3): qty 1

## 2021-09-24 NOTE — Progress Notes (Signed)
Progress Note    Julie [name]  WGN:[id number] DOB: 1945/12/09  DOA: 09/19/2021 PCP: Gladstone Lighter, MD      Brief Narrative:    Medical records reviewed and are as summarized below:  Julie [name] is a 75 y.o. female with medical history significant for chronic diastolic CHF, stage IIIb CKD, chronic hypoxemic respiratory failure on 2 L/min oxygen at home, type II DM, hypertension, dyslipidemia, osteoporosis.  She presented to the hospital because of altered mental status shortness of breath that is worse with exertion, orthopnea and lower extremity edema.  History is significant for a fall at home about 2 days prior to admission.  Reportedly oxygen saturation was 68% on room air when EMS picked her up.  Her troponins evaluated (903), 832).  She was admitted to the hospital for acute on chronic diastolic CHF, acute on chronic hypoxemic respiratory failure and acute metabolic encephalopathy.  Elevated troponins were attributed to demand ischemia per cardiologist.  She was also found to have AKI and elevated liver enzymes probably from acute CHF.    Assessment/Plan:   Principal Problem:   Demand ischemia (HCC) Active Problems:   Acute on chronic respiratory failure with hypoxia (HCC)   Acute on chronic diastolic CHF (congestive heart failure) (HCC)   Body mass index is 45.88 kg/m.  (Morbid obesity)   Acute on chronic diastolic CHF: Continue IV Lasix for 1 more day.  Monitor BMP, urine output and daily weight.  Monitor daily weight, BMP and urine output.  2D echo showed EF estimated at 65 to 70%, normal LV diastolic parameters.  BNP was 848.5.  Elevated troponins: Probably from demand ischemia.  No chest pain. No plan for left heart cath  Acute on chronic hypoxemic respiratory failure: She is requiring 3.5 L/min oxygen via nasal cannula.  She uses 2 L/min oxygen at home  Left lower lobe consolidation concerning for atelectasis or pneumonia: Blood culture showed  staph lugdunensis and staph epidermidis which are likely contaminants.  Continue Omnicef  AKI on CKD stage IIIb, hyperkalemia: Improving.  Discussed plan of care with Dr. Candiss Norse, nephrologist.  Continue IV Lasix for another day.  Baseline creatinine is between 1.5-1.9.  Monitor BMP  Hyponatremia: Improved.  She has been placed on fluid restriction up to 1.2 L/day.  Elevated liver enzymes: Improved.  This is probably due to acute CHF.  She has cholelithiasis noted on CT chest.    Acute metabolic encephalopathy: Improved  Type II DM: Hold glipizide.  Use NovoLog as needed for hyperglycemia.  Low back pain: X-ray of the lumbar spine showed grade 2 anterolisthesis L4-5 and L5-S1, Bilateral pars defects of L4.  Outpatient follow-up with PCP orthopedic surgeon.  Generalized weakness/fall at home: PT and OT recommend home health therapy   Diet Order             Diet Heart Room service appropriate? Yes; Fluid consistency: Thin; Fluid restriction: 1200 mL Fluid  Diet effective now                      Consultants: Cardiologist Nephrologist  Procedures: None    Medications:    amLODipine  10 mg Oral Daily   carvedilol  12.5 mg Oral BID WC   Chlorhexidine Gluconate Cloth  6 each Topical Daily   cholecalciferol  1,000 Units Oral Daily   furosemide  60 mg Intravenous BID   heparin injection (subcutaneous)  5,000 Units Subcutaneous Q8H   insulin aspart  0-9  Units Subcutaneous TID PC & HS   multivitamin with minerals  1 tablet Oral Daily   omega-3 acid ethyl esters  1 g Oral Daily   Continuous Infusions:     Anti-infectives (From admission, onward)    Start     Dose/Rate Route Frequency Ordered Stop   09/22/21 2200  cefdinir (OMNICEF) capsule 300 mg        300 mg Oral Daily 09/22/21 1358 09/23/21 2116   09/21/21 1800  azithromycin (ZITHROMAX) tablet 500 mg  Status:  Discontinued        500 mg Oral Every evening 09/21/21 1536 09/23/21 0800   09/19/21 2030  cefTRIAXone  (ROCEPHIN) 2 g in sodium chloride 0.9 % 100 mL IVPB  Status:  Discontinued        2 g 200 mL/hr over 30 Minutes Intravenous Every 24 hours 09/19/21 2028 09/22/21 1358   09/19/21 2030  azithromycin (ZITHROMAX) 500 mg in sodium chloride 0.9 % 250 mL IVPB  Status:  Discontinued        500 mg 250 mL/hr over 60 Minutes Intravenous Every 24 hours 09/19/21 2028 09/21/21 1536              Family Communication/Anticipated D/C date and plan/Code Status   DVT prophylaxis: heparin injection 5,000 Units Start: 09/21/21 1630     Code Status: DNR  Family Communication: None Disposition Plan: Possible discharge to home tomorrow   Status is: Inpatient  Remains inpatient appropriate because: Acute heart failure           Subjective:   Interval events noted.  No new complaints.  No shortness of breath or chest pain.  She still has some back pain.  Overall, she feels better.  Objective:    Vitals:   09/24/21 0338 09/24/21 0339 09/24/21 0813 09/24/21 1204  BP: (!) 147/64  (!) 116/57 123/63  Pulse: 71  71 72  Resp: 18     Temp: 98.2 F (36.8 C)  98.6 F (37 C) 97.6 F (36.4 C)  TempSrc: Oral  Oral Oral  SpO2: 95%  100% 98%  Weight:  125.1 kg    Height:       No data found.   Intake/Output Summary (Last 24 hours) at 09/24/2021 1217 Last data filed at 09/24/2021 1211 Gross per 24 hour  Intake 0 ml  Output 1600 ml  Net -1600 ml   Filed Weights   09/22/21 0600 09/23/21 0500 09/24/21 0339  Weight: 129 kg 126.5 kg 125.1 kg    Exam:  GEN: NAD, sitting up in the chair SKIN: Warm and dry EYES: EOMI ENT: MMM CV: RRR PULM: Bibasilar rales, no wheezing ABD: soft, obese, NT, +BS CNS: AAO x 3, non focal EXT: Still has edema of bilateral legs although it is improving.           Data Reviewed:   I have personally reviewed following labs and imaging studies:  Labs: Labs show the following:   Basic Metabolic Panel: Recent Labs  Lab 09/20/21 0451  09/21/21 0452 09/22/21 0559 09/23/21 0846 09/24/21 0509  NA 134* 128* 133* 133* 135  K 5.1 4.8 4.9 4.7 4.5  CL 100 97* 97* 98 98  CO2 24 23 26 26 27   GLUCOSE 143* 125* 163* 140* 150*  BUN 82* 79* 93* 91* 93*  CREATININE 4.41* 3.95* 3.81* 3.22* 2.56*  CALCIUM 8.4* 7.7* 8.1* 8.3* 8.5*   GFR Estimated Creatinine Clearance: 26 mL/min (A) (by C-G formula based on SCr of 2.56  mg/dL (H)). Liver Function Tests: Recent Labs  Lab 09/19/21 1859 09/20/21 0451 09/21/21 0452 09/22/21 0559 09/24/21 0509  AST 874* 485* 173* 66* 27  ALT 1,399* 1,046* 706* 515* 244*  ALKPHOS 77 65 59 57 60  BILITOT 1.1 0.9 0.5 0.7 0.8  PROT 6.7 5.9* 5.5* 5.7* 5.4*  ALBUMIN 3.6 3.1* 2.8* 2.9* 2.9*   No results for input(s): LIPASE, AMYLASE in the last 168 hours. No results for input(s): AMMONIA in the last 168 hours. Coagulation profile Recent Labs  Lab 09/19/21 1859  INR 1.3*    CBC: Recent Labs  Lab 09/19/21 1859 09/20/21 0451 09/21/21 0452  WBC 11.1* 9.8 6.5  NEUTROABS 9.7*  --   --   HGB 10.6* 9.7* 9.0*  HCT 34.9* 31.5* 29.5*  MCV 82.1 81.4 79.1*  PLT 145* 143* 128*   Cardiac Enzymes: Recent Labs  Lab 09/19/21 1859  CKTOTAL 487*   BNP (last 3 results) No results for input(s): PROBNP in the last 8760 hours. CBG: Recent Labs  Lab 09/23/21 1222 09/23/21 1551 09/23/21 2104 09/24/21 0814 09/24/21 1208  GLUCAP 209* 190* 202* 155* 258*   D-Dimer: No results for input(s): DDIMER in the last 72 hours. Hgb A1c: No results for input(s): HGBA1C in the last 72 hours. Lipid Profile: No results for input(s): CHOL, HDL, LDLCALC, TRIG, CHOLHDL, LDLDIRECT in the last 72 hours. Thyroid function studies: No results for input(s): TSH, T4TOTAL, T3FREE, THYROIDAB in the last 72 hours.  Invalid input(s): FREET3 Anemia work up: No results for input(s): VITAMINB12, FOLATE, FERRITIN, TIBC, IRON, RETICCTPCT in the last 72 hours. Sepsis Labs: Recent Labs  Lab 09/19/21 1859 09/19/21 2043  09/20/21 0451 09/21/21 0452  WBC 11.1*  --  9.8 6.5  LATICACIDVEN  --  0.9 0.7  --     Microbiology Recent Results (from the past 240 hour(s))  Resp Panel by RT-PCR (Flu A&B, Covid) Nasopharyngeal Swab     Status: None   Collection Time: 09/16/21  7:45 PM   Specimen: Nasopharyngeal Swab; Nasopharyngeal(NP) swabs in vial transport medium  Result Value Ref Range Status   SARS Coronavirus 2 by RT PCR NEGATIVE NEGATIVE Final    Comment: (NOTE) SARS-CoV-2 target nucleic acids are NOT DETECTED.  The SARS-CoV-2 RNA is generally detectable in upper respiratory specimens during the acute phase of infection. The lowest concentration of SARS-CoV-2 viral copies this assay can detect is 138 copies/mL. A negative result does not preclude SARS-Cov-2 infection and should not be used as the sole basis for treatment or other patient management decisions. A negative result may occur with  improper specimen collection/handling, submission of specimen other than nasopharyngeal swab, presence of viral mutation(s) within the areas targeted by this assay, and inadequate number of viral copies(<138 copies/mL). A negative result must be combined with clinical observations, patient history, and epidemiological information. The expected result is Negative.  Fact Sheet for Patients:  EntrepreneurPulse.com.au  Fact Sheet for Healthcare Providers:  IncredibleEmployment.be  This test is no t yet approved or cleared by the [place] FDA and  has been authorized for detection and/or diagnosis of SARS-CoV-2 by FDA under an Emergency Use Authorization (EUA). This EUA will remain  in effect (meaning this test can be used) for the duration of the COVID-19 declaration under Section 564(b)(1) of the Act, 21 U.S.C.section 360bbb-3(b)(1), unless the authorization is terminated  or revoked sooner.       Influenza A by PCR NEGATIVE NEGATIVE Final   Influenza B by PCR  NEGATIVE NEGATIVE  Final    Comment: (NOTE) The Xpert Xpress SARS-CoV-2/FLU/RSV plus assay is intended as an aid in the diagnosis of influenza from Nasopharyngeal swab specimens and should not be used as a sole basis for treatment. Nasal washings and aspirates are unacceptable for Xpert Xpress SARS-CoV-2/FLU/RSV testing.  Fact Sheet for Patients: EntrepreneurPulse.com.au  Fact Sheet for Healthcare Providers: IncredibleEmployment.be  This test is not yet approved or cleared by the [place] FDA and has been authorized for detection and/or diagnosis of SARS-CoV-2 by FDA under an Emergency Use Authorization (EUA). This EUA will remain in effect (meaning this test can be used) for the duration of the COVID-19 declaration under Section 564(b)(1) of the Act, 21 U.S.C. section 360bbb-3(b)(1), unless the authorization is terminated or revoked.  Performed at [facility] [address] [place], [place] [address]   Resp Panel by RT-PCR (Flu A&B, Covid) Nasopharyngeal Swab     Status: None   Collection Time: 09/19/21  6:59 PM   Specimen: Nasopharyngeal Swab; Nasopharyngeal(NP) swabs in vial transport medium  Result Value Ref Range Status   SARS Coronavirus 2 by RT PCR NEGATIVE NEGATIVE Final    Comment: (NOTE) SARS-CoV-2 target nucleic acids are NOT DETECTED.  The SARS-CoV-2 RNA is generally detectable in upper respiratory specimens during the acute phase of infection. The lowest concentration of SARS-CoV-2 viral copies this assay can detect is 138 copies/mL. A negative result does not preclude SARS-Cov-2 infection and should not be used as the sole basis for treatment or other patient management decisions. A negative result may occur with  improper specimen collection/handling, submission of specimen other than nasopharyngeal swab, presence of viral mutation(s) within the areas targeted by this assay, and inadequate number of  viral copies(<138 copies/mL). A negative result must be combined with clinical observations, patient history, and epidemiological information. The expected result is Negative.  Fact Sheet for Patients:  EntrepreneurPulse.com.au  Fact Sheet for Healthcare Providers:  IncredibleEmployment.be  This test is no t yet approved or cleared by the [place] FDA and  has been authorized for detection and/or diagnosis of SARS-CoV-2 by FDA under an Emergency Use Authorization (EUA). This EUA will remain  in effect (meaning this test can be used) for the duration of the COVID-19 declaration under Section 564(b)(1) of the Act, 21 U.S.C.section 360bbb-3(b)(1), unless the authorization is terminated  or revoked sooner.       Influenza A by PCR NEGATIVE NEGATIVE Final   Influenza B by PCR NEGATIVE NEGATIVE Final    Comment: (NOTE) The Xpert Xpress SARS-CoV-2/FLU/RSV plus assay is intended as an aid in the diagnosis of influenza from Nasopharyngeal swab specimens and should not be used as a sole basis for treatment. Nasal washings and aspirates are unacceptable for Xpert Xpress SARS-CoV-2/FLU/RSV testing.  Fact Sheet for Patients: EntrepreneurPulse.com.au  Fact Sheet for Healthcare Providers: IncredibleEmployment.be  This test is not yet approved or cleared by the [place] FDA and has been authorized for detection and/or diagnosis of SARS-CoV-2 by FDA under an Emergency Use Authorization (EUA). This EUA will remain in effect (meaning this test can be used) for the duration of the COVID-19 declaration under Section 564(b)(1) of the Act, 21 U.S.C. section 360bbb-3(b)(1), unless the authorization is terminated or revoked.  Performed at [facility], [address] [place], [place] [address]   Blood culture (routine x 2)     Status: None   Collection Time: 09/19/21  8:43 PM   Specimen: BLOOD  Result  Value Ref Range Status  Specimen Description BLOOD RAC  Final   Special Requests   Final    BOTTLES DRAWN AEROBIC AND ANAEROBIC Blood Culture adequate volume   Culture   Final    NO GROWTH 5 DAYS Performed at [facility], [address] [place], [place] [address]    Report Status 09/24/2021 FINAL  Final  Blood culture (routine x 2)     Status: Abnormal   Collection Time: 09/19/21  8:43 PM   Specimen: BLOOD  Result Value Ref Range Status   Specimen Description   Final    BLOOD LAC Performed at [facility], [address], [place], [place] [address]    Special Requests   Final    BOTTLES DRAWN AEROBIC AND ANAEROBIC Blood Culture adequate volume Performed at [facility], [address] [place], [place] [address]    Culture  Setup Time   Final    Organism ID to follow [place] AEROBIC AND ANAEROBIC BOTTLES CRITICAL RESULT CALLED TO, READ BACK BY AND VERIFIED WITH: SUSAN WATSON 1506 09/20/21 MU Performed at [facility], [address] [place], [place] [address]    Culture (A)  Final    STAPHYLOCOCCUS LUGDUNENSIS STAPHYLOCOCCUS EPIDERMIDIS THE SIGNIFICANCE OF ISOLATING THIS ORGANISM FROM A SINGLE SET OF BLOOD CULTURES WHEN MULTIPLE SETS ARE DRAWN IS UNCERTAIN. PLEASE NOTIFY THE MICROBIOLOGY DEPARTMENT WITHIN ONE WEEK IF SPECIATION AND SENSITIVITIES ARE REQUIRED. Performed at [facility], [place] [address] [place], [place] [address]    Report Status 09/23/2021 FINAL  Final   Organism ID, Bacteria STAPHYLOCOCCUS LUGDUNENSIS  Final      Susceptibility   Staphylococcus lugdunensis - MIC*    CIPROFLOXACIN <=0.5 SENSITIVE Sensitive     ERYTHROMYCIN <=0.25 SENSITIVE Sensitive     GENTAMICIN <=0.5 SENSITIVE Sensitive     OXACILLIN 0.5 SENSITIVE Sensitive     TETRACYCLINE <=1 SENSITIVE Sensitive     VANCOMYCIN <=0.5 SENSITIVE Sensitive     TRIMETH/SULFA <=10 SENSITIVE Sensitive     CLINDAMYCIN <=0.25 SENSITIVE  Sensitive     RIFAMPIN <=0.5 SENSITIVE Sensitive     Inducible Clindamycin NEGATIVE Sensitive     * STAPHYLOCOCCUS LUGDUNENSIS  Blood Culture ID Panel (Reflexed)     Status: Abnormal   Collection Time: 09/19/21  8:43 PM  Result Value Ref Range Status   Enterococcus faecalis NOT DETECTED NOT DETECTED Final   Enterococcus Faecium NOT DETECTED NOT DETECTED Final   Listeria monocytogenes NOT DETECTED NOT DETECTED Final   Staphylococcus species DETECTED (A) NOT DETECTED Final    Comment: CRITICAL RESULT CALLED TO, READ BACK BY AND VERIFIED WITH: SUSAN WATSON 1506 09/20/21 MU    Staphylococcus aureus (BCID) NOT DETECTED NOT DETECTED Final   Staphylococcus epidermidis DETECTED (A) NOT DETECTED Final    Comment: Methicillin (oxacillin) resistant coagulase negative staphylococcus. Possible blood culture contaminant (unless isolated from more than one blood culture draw or clinical case suggests pathogenicity). No antibiotic treatment is indicated for blood  culture contaminants. CRITICAL RESULT CALLED TO, READ BACK BY AND VERIFIED WITH: SUSAN WATSON 1506 09/20/21 MU    Staphylococcus lugdunensis DETECTED (A) NOT DETECTED Final    Comment: Methicillin (oxacillin) resistant coagulase negative staphylococcus. Possible blood culture contaminant (unless isolated from more than one blood culture draw or clinical case suggests pathogenicity). No antibiotic treatment is indicated for blood  culture contaminants. CRITICAL RESULT CALLED TO, READ BACK BY AND VERIFIED WITH: SUSAN WATSON 1506 09/20/21 MU    Streptococcus species NOT DETECTED  NOT DETECTED Final   Streptococcus agalactiae NOT DETECTED NOT DETECTED Final   Streptococcus pneumoniae NOT DETECTED NOT DETECTED Final   Streptococcus pyogenes NOT DETECTED NOT DETECTED Final   A.calcoaceticus-baumannii NOT DETECTED NOT DETECTED Final   Bacteroides fragilis NOT DETECTED NOT DETECTED Final   Enterobacterales NOT DETECTED NOT DETECTED Final    Enterobacter cloacae complex NOT DETECTED NOT DETECTED Final   Escherichia coli NOT DETECTED NOT DETECTED Final   Klebsiella aerogenes NOT DETECTED NOT DETECTED Final   Klebsiella oxytoca NOT DETECTED NOT DETECTED Final   Klebsiella pneumoniae NOT DETECTED NOT DETECTED Final   Proteus species NOT DETECTED NOT DETECTED Final   Salmonella species NOT DETECTED NOT DETECTED Final   Serratia marcescens NOT DETECTED NOT DETECTED Final   Haemophilus influenzae NOT DETECTED NOT DETECTED Final   Neisseria meningitidis NOT DETECTED NOT DETECTED Final   Pseudomonas aeruginosa NOT DETECTED NOT DETECTED Final   Stenotrophomonas maltophilia NOT DETECTED NOT DETECTED Final   Candida albicans NOT DETECTED NOT DETECTED Final   Candida auris NOT DETECTED NOT DETECTED Final   Candida glabrata NOT DETECTED NOT DETECTED Final   Candida krusei NOT DETECTED NOT DETECTED Final   Candida parapsilosis NOT DETECTED NOT DETECTED Final   Candida tropicalis NOT DETECTED NOT DETECTED Final   Cryptococcus neoformans/gattii NOT DETECTED NOT DETECTED Final   Methicillin resistance mecA/C DETECTED (A) NOT DETECTED Final    Comment: CRITICAL RESULT CALLED TO, READ BACK BY AND VERIFIED WITH: SUSAN WATSON 1506 09/20/21 MU Performed at [facility], [address] [place], [place] [address]     Procedures and diagnostic studies:  DG Lumbar Spine 2-3 Views  Result Date: 09/23/2021 CLINICAL DATA:  Back pain no injury EXAM: LUMBAR SPINE - 2-3 VIEW COMPARISON:  None. FINDINGS: Grade 2 anterolisthesis L4-5 with advanced disc degeneration and bilateral pars defects. Grade 2 anterolisthesis L5-S1 with disc degeneration. No definite pars defects. This may be due to facet degeneration. Negative for acute fracture. Remaining disc spaces intact. SI joints intact. IMPRESSION: Grade 2 anterolisthesis L4-5 and L5-S1. Bilateral pars defects of L4. Electronically Signed   By: Franchot Gallo M.D.   On: 09/23/2021 11:43                LOS: 5 days   Julie [name]  [facility]   Pager on [email or web address]. If 7PM-7AM, please contact night-coverage at [email or web address]     09/24/2021, 12:17 PM

## 2021-09-24 NOTE — Progress Notes (Signed)
[facility]  SUBJECTIVE: Patient sitting up in chair, reports feeling much better, less shortness of breath   Vitals:   09/24/21 0013 09/24/21 0338 09/24/21 0339 09/24/21 0813  BP: (!) 123/55 (!) 147/64  (!) 116/57  Pulse: 66 71  71  Resp: 17 18    Temp: 98.6 F (37 C) 98.2 F (36.8 C)  98.6 F (37 C)  TempSrc: Oral Oral  Oral  SpO2: 96% 95%  100%  Weight:   125.1 kg   Height:         Intake/Output Summary (Last 24 hours) at 09/24/2021 0946 Last data filed at 09/24/2021 0340 Gross per 24 hour  Intake 240 ml  Output 1000 ml  Net -760 ml      PHYSICAL EXAM  General: Well developed, well nourished, in no acute distress HEENT:  Normocephalic and atramatic Neck:  No JVD.  Lungs: Clear bilaterally to auscultation and percussion. Heart: HRRR . Normal S1 and S2 without gallops or murmurs.  Abdomen: Bowel sounds are positive, abdomen soft and non-tender  Msk:  Back normal, normal gait. Normal strength and tone for age. Extremities: No clubbing, cyanosis or edema.   Neuro: Alert and oriented X 3. Psych:  Good affect, responds appropriately   LABS: Basic Metabolic Panel: Recent Labs    09/23/21 0846 09/24/21 0509  NA 133* 135  K 4.7 4.5  CL 98 98  CO2 26 27  GLUCOSE 140* 150*  BUN 91* 93*  CREATININE 3.22* 2.56*  CALCIUM 8.3* 8.5*   Liver Function Tests: Recent Labs    09/22/21 0559 09/24/21 0509  AST 66* 27  ALT 515* 244*  ALKPHOS 57 60  BILITOT 0.7 0.8  PROT 5.7* 5.4*  ALBUMIN 2.9* 2.9*   No results for input(s): LIPASE, AMYLASE in the last 72 hours. CBC: No results for input(s): WBC, NEUTROABS, HGB, HCT, MCV, PLT in the last 72 hours. Cardiac Enzymes: No results for input(s): CKTOTAL, CKMB, CKMBINDEX, TROPONINI in the last 72 hours. BNP: Invalid input(s): POCBNP D-Dimer: No results for input(s): DDIMER in the last 72 hours. Hemoglobin A1C: No results for input(s): HGBA1C in the last 72 hours. Fasting Lipid Panel: No results for input(s):  CHOL, HDL, LDLCALC, TRIG, CHOLHDL, LDLDIRECT in the last 72 hours. Thyroid Function Tests: No results for input(s): TSH, T4TOTAL, T3FREE, THYROIDAB in the last 72 hours.  Invalid input(s): FREET3 Anemia Panel: No results for input(s): VITAMINB12, FOLATE, FERRITIN, TIBC, IRON, RETICCTPCT in the last 72 hours.  DG Lumbar Spine 2-3 Views  Result Date: 09/23/2021 CLINICAL DATA:  Back pain no injury EXAM: LUMBAR SPINE - 2-3 VIEW COMPARISON:  None. FINDINGS: Grade 2 anterolisthesis L4-5 with advanced disc degeneration and bilateral pars defects. Grade 2 anterolisthesis L5-S1 with disc degeneration. No definite pars defects. This may be due to facet degeneration. Negative for acute fracture. Remaining disc spaces intact. SI joints intact. IMPRESSION: Grade 2 anterolisthesis L4-5 and L5-S1. Bilateral pars defects of L4. Electronically Signed   By: Franchot Gallo M.D.   On: 09/23/2021 11:43     Echo LVEF 65 to 70%, 09/20/2021  TELEMETRY: Sinus rhythm 71 bpm:  ASSESSMENT AND PLAN:  Principal Problem:   Demand ischemia (HCC) Active Problems:   Acute on chronic respiratory failure with hypoxia (HCC)   Acute on chronic diastolic CHF (congestive heart failure) ([place])    1. Acute on chronic diastolic congestive heart failure, normal left ventricular function by 2D echocardiogram, on furosemide 60 mg IV twice daily 2.  Essential hypertension, on  amlodipine and carvedilol 3.  Acute kidney injury with underlying CKD stage IIIb, followed by nephrology/Dr. Holley Raring, lisinopril held, BUN/creatinine 93 and 2.56, respectively 4.  Elevated troponin (903, 832), in the absence of chest pain, likely demand supply ischemia 5.  Anemia, hemoglobin and hematocrit 9.7 and 31.5, respectively   Recommendations   1.  Agree with current therapy 2.  Continue diuresis 3.  Carefully monitor renal status 4.  Continue to hold lisinopril for now 5.  Defer further cardiac diagnostics at this time 6.  Follow-up with Dr.  Nehemiah Massed 1 to 2 weeks after discharge   Isaias Cowman, MD, PhD, [facility] 09/24/2021 9:46 AM

## 2021-09-24 NOTE — Progress Notes (Addendum)
[facility]  ROUNDING NOTE   Subjective:   Julie [name] is a 75 year old female with past medical history of chronic diastolic heart failure, chronic respiratory failure on 2 L oxygen at home, diabetes, hypertension, dyslipidemia, osteoporosis, and CKD stage IIIb.  Patient presents to the emergency room with altered mental status and worsening shortness of breath.  She will be admitted for Elevated liver function tests [R79.89] NSTEMI (non-ST elevated myocardial infarction) (HCC) [I21.4] AKI (acute kidney injury) ([place]) [N17.9]  Patient is known to our clinic and sees Dr. Holley Raring.  Last appointment with Dr. Holley Raring was on 08/15/2021.   Update: Patient found sitting on a recliner near the bed. Grandson at the bedside.Patient reports feeling better. She denies SOB, nausea or vomiting. She is still on O2 3L via nasal cannula.  Objective:  Vital signs in last 24 hours:  Temp:  [97.5 F (36.4 C)-98.6 F (37 C)] 97.6 F (36.4 C) (12/11 1204) Pulse Rate:  [66-72] 72 (12/11 1204) Resp:  [17-20] 18 (12/11 0338) BP: (116-147)/(51-64) 123/63 (12/11 1204) SpO2:  [95 %-100 %] 98 % (12/11 1204) Weight:  [125.1 kg] 125.1 kg (12/11 0339)  Weight change: -1.406 kg Filed Weights   09/22/21 0600 09/23/21 0500 09/24/21 0339  Weight: 129 kg 126.5 kg 125.1 kg    Intake/Output: I/O last 3 completed shifts: In: 240 [P.O.:240] Out: 1700 [Urine:1700]   Intake/Output this shift:  Total I/O In: -  Out: 600 [Urine:600]  Physical Exam: General: Appears pleasant and comfortable  Head: Normocephalic, atraumatic ,oral mucosal membranes moist  Eyes: Anicteric  Lungs:  Normal respiratory effort, O2 3 L via [place],lungs clear  Heart: S1S2, no rubs or gallops  Abdomen:  Soft, nontender, nondistended  Extremities:  2+ peripheral edema.  Neurologic: oriented  Skin: No acute lesions or rashes    Basic Metabolic Panel: Recent Labs  Lab 09/20/21 0451 09/21/21 0452 09/22/21 0559  09/23/21 0846 09/24/21 0509  NA 134* 128* 133* 133* 135  K 5.1 4.8 4.9 4.7 4.5  CL 100 97* 97* 98 98  CO2 24 23 26 26 27   GLUCOSE 143* 125* 163* 140* 150*  BUN 82* 79* 93* 91* 93*  CREATININE 4.41* 3.95* 3.81* 3.22* 2.56*  CALCIUM 8.4* 7.7* 8.1* 8.3* 8.5*     Liver Function Tests: Recent Labs  Lab 09/19/21 1859 09/20/21 0451 09/21/21 0452 09/22/21 0559 09/24/21 0509  AST 874* 485* 173* 66* 27  ALT 1,399* 1,046* 706* 515* 244*  ALKPHOS 77 65 59 57 60  BILITOT 1.1 0.9 0.5 0.7 0.8  PROT 6.7 5.9* 5.5* 5.7* 5.4*  ALBUMIN 3.6 3.1* 2.8* 2.9* 2.9*    No results for input(s): LIPASE, AMYLASE in the last 168 hours. No results for input(s): AMMONIA in the last 168 hours.  CBC: Recent Labs  Lab 09/19/21 1859 09/20/21 0451 09/21/21 0452  WBC 11.1* 9.8 6.5  NEUTROABS 9.7*  --   --   HGB 10.6* 9.7* 9.0*  HCT 34.9* 31.5* 29.5*  MCV 82.1 81.4 79.1*  PLT 145* 143* 128*     Cardiac Enzymes: Recent Labs  Lab 09/19/21 1859  CKTOTAL 487*     BNP: Invalid input(s): POCBNP  CBG: Recent Labs  Lab 09/23/21 1222 09/23/21 1551 09/23/21 2104 09/24/21 0814 09/24/21 1208  GLUCAP 209* 190* 202* 155* 258*     Microbiology: Results for orders placed or performed during the hospital encounter of 09/19/21  Resp Panel by RT-PCR (Flu A&B, Covid) Nasopharyngeal Swab     Status: None  Collection Time: 09/19/21  6:59 PM   Specimen: Nasopharyngeal Swab; Nasopharyngeal(NP) swabs in vial transport medium  Result Value Ref Range Status   SARS Coronavirus 2 by RT PCR NEGATIVE NEGATIVE Final    Comment: (NOTE) SARS-CoV-2 target nucleic acids are NOT DETECTED.  The SARS-CoV-2 RNA is generally detectable in upper respiratory specimens during the acute phase of infection. The lowest concentration of SARS-CoV-2 viral copies this assay can detect is 138 copies/mL. A negative result does not preclude SARS-Cov-2 infection and should not be used as the sole basis for treatment  or other patient management decisions. A negative result may occur with  improper specimen collection/handling, submission of specimen other than nasopharyngeal swab, presence of viral mutation(s) within the areas targeted by this assay, and inadequate number of viral copies(<138 copies/mL). A negative result must be combined with clinical observations, patient history, and epidemiological information. The expected result is Negative.  Fact Sheet for Patients:  EntrepreneurPulse.com.au  Fact Sheet for Healthcare Providers:  IncredibleEmployment.be  This test is no t yet approved or cleared by the [place] FDA and  has been authorized for detection and/or diagnosis of SARS-CoV-2 by FDA under an Emergency Use Authorization (EUA). This EUA will remain  in effect (meaning this test can be used) for the duration of the COVID-19 declaration under Section 564(b)(1) of the Act, 21 U.S.C.section 360bbb-3(b)(1), unless the authorization is terminated  or revoked sooner.       Influenza A by PCR NEGATIVE NEGATIVE Final   Influenza B by PCR NEGATIVE NEGATIVE Final    Comment: (NOTE) The Xpert Xpress SARS-CoV-2/FLU/RSV plus assay is intended as an aid in the diagnosis of influenza from Nasopharyngeal swab specimens and should not be used as a sole basis for treatment. Nasal washings and aspirates are unacceptable for Xpert Xpress SARS-CoV-2/FLU/RSV testing.  Fact Sheet for Patients: EntrepreneurPulse.com.au  Fact Sheet for Healthcare Providers: IncredibleEmployment.be  This test is not yet approved or cleared by the [place] FDA and has been authorized for detection and/or diagnosis of SARS-CoV-2 by FDA under an Emergency Use Authorization (EUA). This EUA will remain in effect (meaning this test can be used) for the duration of the COVID-19 declaration under Section 564(b)(1) of the Act, 21 U.S.C. section  360bbb-3(b)(1), unless the authorization is terminated or revoked.  Performed at [facility], [address] [place], [place] [address]   Blood culture (routine x 2)     Status: None   Collection Time: 09/19/21  8:43 PM   Specimen: BLOOD  Result Value Ref Range Status   Specimen Description BLOOD RAC  Final   Special Requests   Final    BOTTLES DRAWN AEROBIC AND ANAEROBIC Blood Culture adequate volume   Culture   Final    NO GROWTH 5 DAYS Performed at [facility], [address] [place], [place] [address]    Report Status 09/24/2021 FINAL  Final  Blood culture (routine x 2)     Status: Abnormal   Collection Time: 09/19/21  8:43 PM   Specimen: BLOOD  Result Value Ref Range Status   Specimen Description   Final    BLOOD LAC Performed at [facility], [address], [place], [place] [address]    Special Requests   Final    BOTTLES DRAWN AEROBIC AND ANAEROBIC Blood Culture adequate volume Performed at [facility], [address], [place], [place] [address]    Culture  Setup Time   Final    Organism ID  to follow [place] AND ANAEROBIC BOTTLES CRITICAL RESULT CALLED TO, READ BACK BY AND VERIFIED WITH: SUSAN [place] 1506 09/20/21 MU Performed at [facility], [address] [place], [place] [address]    Culture (A)  Final    STAPHYLOCOCCUS LUGDUNENSIS STAPHYLOCOCCUS EPIDERMIDIS THE SIGNIFICANCE OF ISOLATING THIS ORGANISM FROM A SINGLE SET OF BLOOD CULTURES WHEN MULTIPLE SETS ARE DRAWN IS UNCERTAIN. PLEASE NOTIFY THE MICROBIOLOGY DEPARTMENT WITHIN ONE WEEK IF SPECIATION AND SENSITIVITIES ARE REQUIRED. Performed at [facility], [place] [address], [place], [place] [address]    Report Status 09/23/2021 FINAL  Final   Organism ID, Bacteria STAPHYLOCOCCUS LUGDUNENSIS  Final      Susceptibility   Staphylococcus lugdunensis - MIC*    CIPROFLOXACIN <=0.5 SENSITIVE Sensitive     ERYTHROMYCIN <=0.25  SENSITIVE Sensitive     GENTAMICIN <=0.5 SENSITIVE Sensitive     OXACILLIN 0.5 SENSITIVE Sensitive     TETRACYCLINE <=1 SENSITIVE Sensitive     VANCOMYCIN <=0.5 SENSITIVE Sensitive     TRIMETH/SULFA <=10 SENSITIVE Sensitive     CLINDAMYCIN <=0.25 SENSITIVE Sensitive     RIFAMPIN <=0.5 SENSITIVE Sensitive     Inducible Clindamycin NEGATIVE Sensitive     * STAPHYLOCOCCUS LUGDUNENSIS  Blood Culture ID Panel (Reflexed)     Status: Abnormal   Collection Time: 09/19/21  8:43 PM  Result Value Ref Range Status   Enterococcus faecalis NOT DETECTED NOT DETECTED Final   Enterococcus Faecium NOT DETECTED NOT DETECTED Final   Listeria monocytogenes NOT DETECTED NOT DETECTED Final   Staphylococcus species DETECTED (A) NOT DETECTED Final    Comment: CRITICAL RESULT CALLED TO, READ BACK BY AND VERIFIED WITH: SUSAN W[place] 1506 09/20/21 MU    Staphylococcus aureus (BCID) NOT DETECTED NOT DETECTED Final   Staphylococcus epidermidis DETECTED (A) NOT DETECTED Final    Comment: Methicillin (oxacillin) resistant coagulase negative staphylococcus. Possible blood culture contaminant (unless isolated from more than one blood culture draw or clinical case suggests pathogenicity). No antibiotic treatment is indicated for blood  culture contaminants. CRITICAL RESULT CALLED TO, READ BACK BY AND VERIFIED WITH: SUSAN WATSON 1506 09/20/21 MU    Staphylococcus lugdunensis DETECTED (A) NOT DETECTED Final    Comment: Methicillin (oxacillin) resistant coagulase negative staphylococcus. Possible blood culture contaminant (unless isolated from more than one blood culture draw or clinical case suggests pathogenicity). No antibiotic treatment is indicated for blood  culture contaminants. CRITICAL RESULT CALLED TO, READ BACK BY AND VERIFIED WITH: [place] [id number] 09/20/21 MU    Streptococcus species NOT DETECTED NOT DETECTED Final   Streptococcus agalactiae NOT DETECTED NOT DETECTED Final   Streptococcus pneumoniae NOT  DETECTED NOT DETECTED Final   Streptococcus pyogenes NOT DETECTED NOT DETECTED Final   A.calcoaceticus-baumannii NOT DETECTED NOT DETECTED Final   Bacteroides fragilis NOT DETECTED NOT DETECTED Final   Enterobacterales NOT DETECTED NOT DETECTED Final   Enterobacter cloacae complex NOT DETECTED NOT DETECTED Final   Escherichia coli NOT DETECTED NOT DETECTED Final   Klebsiella aerogenes NOT DETECTED NOT DETECTED Final   Klebsiella oxytoca NOT DETECTED NOT DETECTED Final   Klebsiella pneumoniae NOT DETECTED NOT DETECTED Final   Proteus species NOT DETECTED NOT DETECTED Final   Salmonella species NOT DETECTED NOT DETECTED Final   Serratia marcescens NOT DETECTED NOT DETECTED Final   Haemophilus influenzae NOT DETECTED NOT DETECTED Final   Neisseria meningitidis NOT DETECTED NOT DETECTED Final   Pseudomonas aeruginosa NOT DETECTED NOT DETECTED Final   Stenotrophomonas maltophilia  NOT DETECTED NOT DETECTED Final   Candida albicans NOT DETECTED NOT DETECTED Final   Candida auris NOT DETECTED NOT DETECTED Final   Candida glabrata NOT DETECTED NOT DETECTED Final   Candida krusei NOT DETECTED NOT DETECTED Final   Candida parapsilosis NOT DETECTED NOT DETECTED Final   Candida tropicalis NOT DETECTED NOT DETECTED Final   Cryptococcus neoformans/gattii NOT DETECTED NOT DETECTED Final   Methicillin resistance mecA/C DETECTED (A) NOT DETECTED Final    Comment: CRITICAL RESULT CALLED TO, READ BACK BY AND VERIFIED WITH: SUSAN WATSON 1506 09/20/21 MU Performed at [facility], [address] [place], [place] [address]     Coagulation Studies: No results for input(s): LABPROT, INR in the last 72 hours.   Urinalysis: No results for input(s): COLORURINE, LABSPEC, PHURINE, GLUCOSEU, HGBUR, BILIRUBINUR, KETONESUR, PROTEINUR, UROBILINOGEN, NITRITE, LEUKOCYTESUR in the last 72 hours.  Invalid input(s): APPERANCEUR     Imaging: DG Lumbar Spine 2-3 Views  Result Date: 09/23/2021 CLINICAL  DATA:  Back pain no injury EXAM: LUMBAR SPINE - 2-3 VIEW COMPARISON:  None. FINDINGS: Grade 2 anterolisthesis L4-5 with advanced disc degeneration and bilateral pars defects. Grade 2 anterolisthesis L5-S1 with disc degeneration. No definite pars defects. This may be due to facet degeneration. Negative for acute fracture. Remaining disc spaces intact. SI joints intact. IMPRESSION: Grade 2 anterolisthesis L4-5 and L5-S1. Bilateral pars defects of L4. Electronically Signed   By: Franchot Gallo M.D.   On: 09/23/2021 11:43     Medications:      amLODipine  10 mg Oral Daily   carvedilol  12.5 mg Oral BID WC   Chlorhexidine Gluconate Cloth  6 each Topical Daily   cholecalciferol  1,000 Units Oral Daily   furosemide  60 mg Intravenous BID   heparin injection (subcutaneous)  5,000 Units Subcutaneous Q8H   insulin aspart  0-15 Units Subcutaneous TID WC   multivitamin with minerals  1 tablet Oral Daily   omega-3 acid ethyl esters  1 g Oral Daily   acetaminophen **OR** acetaminophen, magnesium hydroxide, morphine injection, nitroGLYCERIN, ondansetron, traZODone  Assessment/ Plan:  Ms. Julie [name] is a 75 y.o.  female with past medical history of chronic diastolic heart failure, chronic respiratory failure on 2 L oxygen at home, diabetes, hypertension, dyslipidemia, osteoporosis, and CKD stage IIIb.  Patient presents to the emergency room with altered mental status and worsening shortness of breath.  She will be admitted for Elevated liver function tests [R79.89] NSTEMI (non-ST elevated myocardial infarction) ([place]) [I21.4] AKI (acute kidney injury) ([place]) [N17.9]  Acute Kidney Injury on chronic kidney disease stage 3b with baseline creatinine 1.74 and GFR of 31 on 08/15/21.  Acute kidney injury likely secondary to cardiorenal syndrome Lisinopril held.  No IV contrast exposure.  Renal ultrasound on 09/20/2021 Unremarkable   Lab Results  Component Value Date   CREATININE 2.56 (H) 09/24/2021    CREATININE 3.22 (H) 09/23/2021   CREATININE 3.81 (H) 09/22/2021     Intake/Output Summary (Last 24 hours) at 09/24/2021 1359 Last data filed at 09/24/2021 1211 Gross per 24 hour  Intake 0 ml  Output 1600 ml  Net -1600 ml   No acute indication for dialysis Continue diuresis with Lasix Will continue monitoring renal function   2. Anemia of chronic kidney disease: Normocytic Lab Results  Component Value Date   HGB 9.0 (L) 09/21/2021   Remains stable    3. Secondary Hyperparathyroidism: with outpatient labs: PTH 84, phosphorus 4.6, calcium 8.9 on 08/15/21.   Lab  Results  Component Value Date   CALCIUM 8.5 (L) 09/24/2021    4.  Hypertension with chronic kidney disease.  Home regimen includes amlodipine, carvedilol, furosemide, and lisinopril.  Lisinopril currently on hold.   Blood pressure within low normal range today  5. Diabetes mellitus type II with chronic kidney disease Lab Results  Component Value Date   HGBA1C 5.4 07/24/2021  Blood glucose 150 this morning     LOS: 5 Richa Shor 12/11/20221:59 PM

## 2021-09-25 LAB — BASIC METABOLIC PANEL
Anion gap: 7 (ref 5–15)
BUN: 87 mg/dL — ABNORMAL HIGH (ref 8–23)
CO2: 29 mmol/L (ref 22–32)
Calcium: 8.7 mg/dL — ABNORMAL LOW (ref 8.9–10.3)
Chloride: 99 mmol/L (ref 98–111)
Creatinine, Ser: 2.34 mg/dL — ABNORMAL HIGH (ref 0.44–1.00)
GFR, Estimated: 21 mL/min — ABNORMAL LOW (ref >60)
Glucose, Bld: 164 mg/dL — ABNORMAL HIGH (ref 70–99)
Potassium: 4.7 mmol/L (ref 3.5–5.1)
Sodium: 135 mmol/L (ref 135–145)

## 2021-09-25 LAB — GLUCOSE, CAPILLARY
Glucose-Capillary: 169 mg/dL — ABNORMAL HIGH (ref 70–99)
Glucose-Capillary: 241 mg/dL — ABNORMAL HIGH (ref 70–99)

## 2021-09-25 NOTE — Care Management Important Message (Signed)
Important Message  Patient Details  Name: Julie [name] MRN: [id number] Date of Birth: 1945-12-27   Medicare Important Message Given:  Yes     Dannette Barbara 09/25/2021, 1:44 PM

## 2021-09-25 NOTE — TOC Initial Note (Addendum)
Transition of Care [facility]) - Initial/Assessment Note    Patient Details  Name: Julie [name] MRN: [id number] Date of Birth: Oct 03, 1946  [facility]) CM/SW Contact:    Eileen Stanford, LCSW Phone Number: 09/25/2021, 10:42 AM  Clinical Narrative:   CSW spoke with pt and pt is agreeable to [facility] and thinks she would benefit from it. Pt states she is going to live with her grandson Harrell Gave at d/c. Pt states she does not have agency preference and is agreeable to CSW searching for a agency to service pt. Pt states she does need a bariatric walker for home.  Referral given to [facility] with [facility]--reviewing. Referral given to [facility] with [facility] for walker. Gilford Rile will be delivered to the room.            Confirmed with pt's grandson Harrell Gave, pt will be dc to his address at [place] [place] [address]. Corene Cornea with [facility] notified.  Expected Discharge Plan: [place] Barriers to Discharge: Continued Medical Work up   Patient Goals and CMS Choice Patient states their goals for this hospitalization and ongoing recovery are:: to go home   Choice offered to / list presented to : Patient  Expected Discharge Plan and Services Expected Discharge Plan: [place] In-house Referral: Clinical Social Work Discharge Planning Services: CM Consult Post Acute Care Choice: [place] arrangements for the past 2 months: Single Family Home Expected Discharge Date: 09/25/21               DME Arranged: Gilford Rile DME Agency: [facility] Date DME Agency Contacted: 09/25/21 Time DME Agency Contacted: 35 Representative spoke with at DME Agency: Suanne Marker HH Arranged: [place]: [place] ([place]) Date [place]: 09/25/21 Time HH Agency Contacted: 26 Representative spoke with at [place]  Prior Living Arrangements/Services Living arrangements for the past 2 months: [place] with:: Adult  Children Patient language and need for interpreter reviewed:: Yes Do you feel safe going back to the place where you live?: Yes      Need for Family Participation in Patient Care: Yes (Comment) Care giver support system in place?: Yes (comment) Current home services: DME (oxygen and cane) Criminal Activity/Legal Involvement Pertinent to Current Situation/Hospitalization: No - Comment as needed  Activities of Daily Living Home Assistive Devices/Equipment: Cane (specify quad or straight), CBG Meter, Oxygen ADL Screening (condition at time of admission) Patient's cognitive ability adequate to safely complete daily activities?: Yes Is the patient deaf or have difficulty hearing?: No Does the patient have difficulty seeing, even when wearing glasses/contacts?: No Does the patient have difficulty concentrating, remembering, or making decisions?: No Patient able to express need for assistance with ADLs?: Yes Does the patient have difficulty dressing or bathing?: No Independently performs ADLs?: Yes (appropriate for developmental age) Does the patient have difficulty walking or climbing stairs?: No Weakness of Legs: None Weakness of Arms/Hands: None  Permission Sought/Granted Permission sought to share information with : Family Supports Permission granted to share information with : Yes, Verbal Permission Granted  Share Information with NAME: Harrell Gave     Permission granted to share info w Relationship: grandson  Permission granted to share info w Contact Information: [phone]  Emotional Assessment Appearance:: Appears stated age Attitude/Demeanor/Rapport: Engaged Affect (typically observed): Accepting Orientation: : Oriented to Self, Oriented to Place, Oriented to  Time, Oriented to Situation Alcohol / Substance Use: Not Applicable Psych Involvement: No (comment)  Admission diagnosis:  Elevated liver function tests [R79.89] NSTEMI (non-ST elevated myocardial infarction) ([place])  [I21.4] AKI (acute kidney injury) ([place]) [N17.9] Patient Active Problem List   Diagnosis Date Noted   Demand ischemia ([place]) 09/19/2021   Acute on chronic diastolic CHF (congestive heart failure) ([place])    Anemia    Acute exacerbation of CHF (congestive heart failure) ([place]) 07/25/2021   Shortness of breath 07/24/2021   Acute kidney injury superimposed on CKD ([place]) 07/24/2021   Acute on chronic respiratory failure with hypoxia ([place]) 07/24/2021   Mixed hyperlipidemia 09/29/2019   Benign essential hypertension 09/29/2019   Bilateral lower extremity edema 07/08/2019   Essential hypertension 07/29/2018   Type 2 diabetes mellitus with hyperlipidemia ([place]) 07/29/2018   Age-related osteoporosis without current pathological fracture 07/29/2018   Hyperlipidemia 07/29/2018   Stool guaiac positive 06/03/2017   Dermatitis 01/04/2017   Low back pain 06/06/2016   Medication monitoring encounter 11/30/2015   Varicose vein of leg 11/30/2015   Abnormal mammogram 08/04/2015   Cardiac murmur 07/28/2015   CKD (chronic kidney disease) stage 2, GFR 60-89 ml/min    Morbid obesity ([place])    Osteoporosis    Proteinuria 10/15/2013   PCP:  Gladstone Lighter, MD Pharmacy:   [facility] ([place], [place] [address] [place] [address] [place] [address] Phone: [phone] Fax: [phone]  [place] [address] ([address]), [place] ([place]) [place] [address] Phone: [phone] Fax: [phone]  [facility] ([facility] ) - [place], [place] - [address] [place] [address] Phone: [phone] Fax: [phone]     Social Determinants of Health (SDOH) Interventions    Readmission Risk Interventions Readmission Risk Prevention Plan 09/20/2021  Transportation Screening Complete  PCP or Specialist Appt within 3-5 Days Complete   HRI or [place] Complete  Social Work Consult for [place] Planning/Counseling Complete  Palliative Care Screening Not Applicable  Medication Review Press photographer) Complete  Some recent data might be hidden

## 2021-09-25 NOTE — Progress Notes (Signed)
Discharge instructions explained to pt and pts family/ verbalized an understanding/ iv and tele removed/ walker delivered to room for home use/ will transport off unit via wheelchair.

## 2021-09-25 NOTE — Progress Notes (Signed)
   Heart Failure Nurse Navigator Note  Patient is being discharged home to be staying with her grandson.  I teach back method went over daily weights and what to report, signs and symptoms to report along with fluid restriction.  She needs very little reinforcement.  Also discussed wearing compression stockings, she states that she has done it in the past but with her bad back has trouble getting them on, discussed looking into the zippered compression stockings, she states that she has heard of those.  Pricilla Riffle RN CHFN

## 2021-09-25 NOTE — Progress Notes (Signed)
[facility] Cardiology  SUBJECTIVE: Patient laying in bed, reports feeling better, improved breathing   Vitals:   09/24/21 1547 09/24/21 1939 09/24/21 2342 09/25/21 0454  BP: (!) 130/57 (!) 128/55 (!) 125/58 132/64  Pulse: 67 68 70 77  Resp:  17 16 18   Temp: 97.7 F (36.5 C) 98.2 F (36.8 C) 97.9 F (36.6 C) 97.8 F (36.6 C)  TempSrc: Oral  Oral Oral  SpO2: 95% 97% 96% 99%  Weight:    124.5 kg  Height:         Intake/Output Summary (Last 24 hours) at 09/25/2021 0800 Last data filed at 09/24/2021 1700 Gross per 24 hour  Intake 0 ml  Output 900 ml  Net -900 ml      PHYSICAL EXAM  General: Well developed, well nourished, in no acute distress HEENT:  Normocephalic and atramatic Neck:  No JVD.  Lungs: Clear bilaterally to auscultation and percussion. Heart: HRRR . Normal S1 and S2 without gallops or murmurs.  Abdomen: Bowel sounds are positive, abdomen soft and non-tender  Msk:  Back normal, normal gait. Normal strength and tone for age. Extremities: No clubbing, cyanosis or edema.   Neuro: Alert and oriented X 3. Psych:  Good affect, responds appropriately   LABS: Basic Metabolic Panel: Recent Labs    09/24/21 0509 09/25/21 0304  NA 135 135  K 4.5 4.7  CL 98 99  CO2 27 29  GLUCOSE 150* 164*  BUN 93* 87*  CREATININE 2.56* 2.34*  CALCIUM 8.5* 8.7*   Liver Function Tests: Recent Labs    09/24/21 0509  AST 27  ALT 244*  ALKPHOS 60  BILITOT 0.8  PROT 5.4*  ALBUMIN 2.9*   No results for input(s): LIPASE, AMYLASE in the last 72 hours. CBC: No results for input(s): WBC, NEUTROABS, HGB, HCT, MCV, PLT in the last 72 hours. Cardiac Enzymes: No results for input(s): CKTOTAL, CKMB, CKMBINDEX, TROPONINI in the last 72 hours. BNP: Invalid input(s): POCBNP D-Dimer: No results for input(s): DDIMER in the last 72 hours. Hemoglobin A1C: No results for input(s): HGBA1C in the last 72 hours. Fasting Lipid Panel: No results for input(s): CHOL, HDL, LDLCALC, TRIG,  CHOLHDL, LDLDIRECT in the last 72 hours. Thyroid Function Tests: No results for input(s): TSH, T4TOTAL, T3FREE, THYROIDAB in the last 72 hours.  Invalid input(s): FREET3 Anemia Panel: No results for input(s): VITAMINB12, FOLATE, FERRITIN, TIBC, IRON, RETICCTPCT in the last 72 hours.  DG Lumbar Spine 2-3 Views  Result Date: 09/23/2021 CLINICAL DATA:  Back pain no injury EXAM: LUMBAR SPINE - 2-3 VIEW COMPARISON:  None. FINDINGS: Grade 2 anterolisthesis L4-5 with advanced disc degeneration and bilateral pars defects. Grade 2 anterolisthesis L5-S1 with disc degeneration. No definite pars defects. This may be due to facet degeneration. Negative for acute fracture. Remaining disc spaces intact. SI joints intact. IMPRESSION: Grade 2 anterolisthesis L4-5 and L5-S1. Bilateral pars defects of L4. Electronically Signed   By: Franchot Gallo M.D.   On: 09/23/2021 11:43     Echo LVEF 65 to 70% 09/20/2021  TELEMETRY: Sinus rhythm 70 bpm:  ASSESSMENT AND PLAN:  Principal Problem:   Demand ischemia (HCC) Active Problems:   Acute on chronic respiratory failure with hypoxia (HCC)   Acute on chronic diastolic CHF (congestive heart failure) ([place])    1. Acute on chronic diastolic congestive heart failure, normal left ventricular function by 2D echocardiogram, on furosemide 60 mg IV twice daily, oxygen saturation 99% on 3.5 L by nasal cannula, patient on oxygen at home  2.  Essential hypertension, on amlodipine and carvedilol 3.  Acute kidney injury with underlying CKD stage IIIb, followed by nephrology/Dr. Holley Raring, lisinopril held, BUN/creatinine 87 and 2.34, respectively, slowly improving 4.  Elevated troponin (903, 832), in the absence of chest pain, likely demand supply ischemia 5.  Anemia, hemoglobin and hematocrit 9.7 and 31.5, respectively   Recommendations   1.  Agree with current therapy 2.  Continue diuresis 3.  Carefully monitor renal status 4.  Continue to hold lisinopril for now 5.  Defer  further cardiac diagnostics at this time 6.  Follow-up with Dr. Nehemiah Massed 1 to 2 weeks after discharge   Isaias Cowman, MD, PhD, [facility] 09/25/2021 8:00 AM

## 2021-09-25 NOTE — Progress Notes (Addendum)
[facility]  ROUNDING NOTE   Subjective:   Julie [name] is a 75 year old female with past medical history of chronic diastolic heart failure, chronic respiratory failure on 2 L oxygen at home, diabetes, hypertension, dyslipidemia, osteoporosis, and CKD stage IIIb.  Patient presents to the emergency room with altered mental status and worsening shortness of breath.  She will be admitted for Elevated liver function tests [R79.89] NSTEMI (non-ST elevated myocardial infarction) (HCC) [I21.4] AKI (acute kidney injury) ([place]) [N17.9]  Patient is known to our clinic and sees Dr. Holley Raring.  Last appointment with Dr. Holley Raring was on 08/15/2021.    Patient seen resting in bed Alert and oriented No family at bedside Tolerating meals without nausea and vomiting She states she feels stronger able to sit on the side of bed unassisted Remains on 3.5 L nasal cannula, denies shortness of breath  Creatinine 2.3 Patient states she is voiding frequently  Objective:  Vital signs in last 24 hours:  Temp:  [97.7 F (36.5 C)-98.2 F (36.8 C)] 98.1 F (36.7 C) (12/12 1139) Pulse Rate:  [62-77] 63 (12/12 1139) Resp:  [16-18] 18 (12/12 0454) BP: (117-132)/(45-64) 117/51 (12/12 1139) SpO2:  [95 %-99 %] 95 % (12/12 1139) Weight:  [124.5 kg] 124.5 kg (12/12 0454)  Weight change: -0.557 kg Filed Weights   09/23/21 0500 09/24/21 0339 09/25/21 0454  Weight: 126.5 kg 125.1 kg 124.5 kg    Intake/Output: I/O last 3 completed shifts: In: 0  Out: 1900 [Urine:1900]   Intake/Output this shift:  Total I/O In: 240 [P.O.:240] Out: 500 [Urine:500]  Physical Exam: General: NAD, resting comfortably  Head: Normocephalic, atraumatic ,oral mucosal membranes moist  Eyes: Anicteric  Lungs:  Normal effort, O2 3.5 L via [place],lungs clear  Heart: S1S2, no rubs or gallops  Abdomen:  Soft, nontender, nondistended  Extremities:  1+ peripheral edema.  Neurologic: Alert and oriented  Skin: No acute lesions  or rashes    Basic Metabolic Panel: Recent Labs  Lab 09/21/21 0452 09/22/21 0559 09/23/21 0846 09/24/21 0509 09/25/21 0304  NA 128* 133* 133* 135 135  K 4.8 4.9 4.7 4.5 4.7  CL 97* 97* 98 98 99  CO2 23 26 26 27 29   GLUCOSE 125* 163* 140* 150* 164*  BUN 79* 93* 91* 93* 87*  CREATININE 3.95* 3.81* 3.22* 2.56* 2.34*  CALCIUM 7.7* 8.1* 8.3* 8.5* 8.7*     Liver Function Tests: Recent Labs  Lab 09/19/21 1859 09/20/21 0451 09/21/21 0452 09/22/21 0559 09/24/21 0509  AST 874* 485* 173* 66* 27  ALT 1,399* 1,046* 706* 515* 244*  ALKPHOS 77 65 59 57 60  BILITOT 1.1 0.9 0.5 0.7 0.8  PROT 6.7 5.9* 5.5* 5.7* 5.4*  ALBUMIN 3.6 3.1* 2.8* 2.9* 2.9*    No results for input(s): LIPASE, AMYLASE in the last 168 hours. No results for input(s): AMMONIA in the last 168 hours.  CBC: Recent Labs  Lab 09/19/21 1859 09/20/21 0451 09/21/21 0452  WBC 11.1* 9.8 6.5  NEUTROABS 9.7*  --   --   HGB 10.6* 9.7* 9.0*  HCT 34.9* 31.5* 29.5*  MCV 82.1 81.4 79.1*  PLT 145* 143* 128*     Cardiac Enzymes: Recent Labs  Lab 09/19/21 1859  CKTOTAL 487*     BNP: Invalid input(s): POCBNP  CBG: Recent Labs  Lab 09/24/21 1208 09/24/21 1548 09/24/21 2103 09/25/21 0822 09/25/21 1141  GLUCAP 258* 214* 167* 169* 241*     Microbiology: Results for orders placed or performed during the hospital  encounter of 09/19/21  Resp Panel by RT-PCR (Flu A&B, Covid) Nasopharyngeal Swab     Status: None   Collection Time: 09/19/21  6:59 PM   Specimen: Nasopharyngeal Swab; Nasopharyngeal(NP) swabs in vial transport medium  Result Value Ref Range Status   SARS Coronavirus 2 by RT PCR NEGATIVE NEGATIVE Final    Comment: (NOTE) SARS-CoV-2 target nucleic acids are NOT DETECTED.  The SARS-CoV-2 RNA is generally detectable in upper respiratory specimens during the acute phase of infection. The lowest concentration of SARS-CoV-2 viral copies this assay can detect is 138 copies/mL. A negative result  does not preclude SARS-Cov-2 infection and should not be used as the sole basis for treatment or other patient management decisions. A negative result may occur with  improper specimen collection/handling, submission of specimen other than nasopharyngeal swab, presence of viral mutation(s) within the areas targeted by this assay, and inadequate number of viral copies(<138 copies/mL). A negative result must be combined with clinical observations, patient history, and epidemiological information. The expected result is Negative.  Fact Sheet for Patients:  EntrepreneurPulse.com.au  Fact Sheet for Healthcare Providers:  IncredibleEmployment.be  This test is no t yet approved or cleared by the [place] FDA and  has been authorized for detection and/or diagnosis of SARS-CoV-2 by FDA under an Emergency Use Authorization (EUA). This EUA will remain  in effect (meaning this test can be used) for the duration of the COVID-19 declaration under Section 564(b)(1) of the Act, 21 U.S.C.section 360bbb-3(b)(1), unless the authorization is terminated  or revoked sooner.       Influenza A by PCR NEGATIVE NEGATIVE Final   Influenza B by PCR NEGATIVE NEGATIVE Final    Comment: (NOTE) The Xpert Xpress SARS-CoV-2/FLU/RSV plus assay is intended as an aid in the diagnosis of influenza from Nasopharyngeal swab specimens and should not be used as a sole basis for treatment. Nasal washings and aspirates are unacceptable for Xpert Xpress SARS-CoV-2/FLU/RSV testing.  Fact Sheet for Patients: EntrepreneurPulse.com.au  Fact Sheet for Healthcare Providers: IncredibleEmployment.be  This test is not yet approved or cleared by the [place] FDA and has been authorized for detection and/or diagnosis of SARS-CoV-2 by FDA under an Emergency Use Authorization (EUA). This EUA will remain in effect (meaning this test can be used) for  the duration of the COVID-19 declaration under Section 564(b)(1) of the Act, 21 U.S.C. section 360bbb-3(b)(1), unless the authorization is terminated or revoked.  Performed at [facility], [address] [place], [place] [address]   Blood culture (routine x 2)     Status: None   Collection Time: 09/19/21  8:43 PM   Specimen: BLOOD  Result Value Ref Range Status   Specimen Description BLOOD RAC  Final   Special Requests   Final    BOTTLES DRAWN AEROBIC AND ANAEROBIC Blood Culture adequate volume   Culture   Final    NO GROWTH 5 DAYS Performed at [facility], [address] [place], [place] [address]    Report Status 09/24/2021 FINAL  Final  Blood culture (routine x 2)     Status: Abnormal   Collection Time: 09/19/21  8:43 PM   Specimen: BLOOD  Result Value Ref Range Status   Specimen Description   Final    BLOOD LAC Performed at [facility], [address] [place], [place] [address]    Special Requests   Final    BOTTLES DRAWN AEROBIC AND ANAEROBIC Blood Culture adequate volume Performed at [address] [place], [place] [address]    Culture  Setup Time   Final    Organism ID to follow GRAM POSITIVE COCCI IN BOTH AEROBIC AND ANAEROBIC BOTTLES CRITICAL RESULT CALLED TO, READ BACK BY AND VERIFIED WITH: SUSAN WATSON 1506 09/20/21 MU Performed at [facility], [address] [place], [place] [address]    Culture (A)  Final    STAPHYLOCOCCUS LUGDUNENSIS STAPHYLOCOCCUS EPIDERMIDIS THE SIGNIFICANCE OF ISOLATING THIS ORGANISM FROM A SINGLE SET OF BLOOD CULTURES WHEN MULTIPLE SETS ARE DRAWN IS UNCERTAIN. PLEASE NOTIFY THE MICROBIOLOGY DEPARTMENT WITHIN ONE WEEK IF SPECIATION AND SENSITIVITIES ARE REQUIRED. Performed at [facility], [place] [address], [place], [place] [address]    Report Status 09/23/2021 FINAL  Final   Organism ID, Bacteria STAPHYLOCOCCUS LUGDUNENSIS  Final      Susceptibility    Staphylococcus lugdunensis - MIC*    CIPROFLOXACIN <=0.5 SENSITIVE Sensitive     ERYTHROMYCIN <=0.25 SENSITIVE Sensitive     GENTAMICIN <=0.5 SENSITIVE Sensitive     OXACILLIN 0.5 SENSITIVE Sensitive     TETRACYCLINE <=1 SENSITIVE Sensitive     VANCOMYCIN <=0.5 SENSITIVE Sensitive     TRIMETH/SULFA <=10 SENSITIVE Sensitive     CLINDAMYCIN <=0.25 SENSITIVE Sensitive     RIFAMPIN <=0.5 SENSITIVE Sensitive     Inducible Clindamycin NEGATIVE Sensitive     * STAPHYLOCOCCUS LUGDUNENSIS  Blood Culture ID Panel (Reflexed)     Status: Abnormal   Collection Time: 09/19/21  8:43 PM  Result Value Ref Range Status   Enterococcus faecalis NOT DETECTED NOT DETECTED Final   Enterococcus Faecium NOT DETECTED NOT DETECTED Final   Listeria monocytogenes NOT DETECTED NOT DETECTED Final   Staphylococcus species DETECTED (A) NOT DETECTED Final    Comment: CRITICAL RESULT CALLED TO, READ BACK BY AND VERIFIED WITH: SUSAN WATSON 1506 09/20/21 MU    Staphylococcus aureus (BCID) NOT DETECTED NOT DETECTED Final   Staphylococcus epidermidis DETECTED (A) NOT DETECTED Final    Comment: Methicillin (oxacillin) resistant coagulase negative staphylococcus. Possible blood culture contaminant (unless isolated from more than one blood culture draw or clinical case suggests pathogenicity). No antibiotic treatment is indicated for blood  culture contaminants. CRITICAL RESULT CALLED TO, READ BACK BY AND VERIFIED WITH: SUSAN WATSON 1506 09/20/21 MU    Staphylococcus lugdunensis DETECTED (A) NOT DETECTED Final    Comment: Methicillin (oxacillin) resistant coagulase negative staphylococcus. Possible blood culture contaminant (unless isolated from more than one blood culture draw or clinical case suggests pathogenicity). No antibiotic treatment is indicated for blood  culture contaminants. CRITICAL RESULT CALLED TO, READ BACK BY AND VERIFIED WITH: [place] [id number] 09/20/21 MU    Streptococcus species NOT DETECTED NOT DETECTED  Final   Streptococcus agalactiae NOT DETECTED NOT DETECTED Final   Streptococcus pneumoniae NOT DETECTED NOT DETECTED Final   Streptococcus pyogenes NOT DETECTED NOT DETECTED Final   A.calcoaceticus-baumannii NOT DETECTED NOT DETECTED Final   Bacteroides fragilis NOT DETECTED NOT DETECTED Final   Enterobacterales NOT DETECTED NOT DETECTED Final   Enterobacter cloacae complex NOT DETECTED NOT DETECTED Final   Escherichia coli NOT DETECTED NOT DETECTED Final   Klebsiella aerogenes NOT DETECTED NOT DETECTED Final   Klebsiella oxytoca NOT DETECTED NOT DETECTED Final   Klebsiella pneumoniae NOT DETECTED NOT DETECTED Final   Proteus species NOT DETECTED NOT DETECTED Final   Salmonella species NOT DETECTED NOT DETECTED Final   Serratia marcescens NOT DETECTED NOT DETECTED Final   Haemophilus influenzae NOT DETECTED NOT DETECTED Final  Neisseria meningitidis NOT DETECTED NOT DETECTED Final   Pseudomonas aeruginosa NOT DETECTED NOT DETECTED Final   Stenotrophomonas maltophilia NOT DETECTED NOT DETECTED Final   Candida albicans NOT DETECTED NOT DETECTED Final   Candida auris NOT DETECTED NOT DETECTED Final   Candida glabrata NOT DETECTED NOT DETECTED Final   Candida krusei NOT DETECTED NOT DETECTED Final   Candida parapsilosis NOT DETECTED NOT DETECTED Final   Candida tropicalis NOT DETECTED NOT DETECTED Final   Cryptococcus neoformans/gattii NOT DETECTED NOT DETECTED Final   Methicillin resistance mecA/C DETECTED (A) NOT DETECTED Final    Comment: CRITICAL RESULT CALLED TO, READ BACK BY AND VERIFIED WITH: SUSAN WATSON 1506 09/20/21 MU Performed at [facility], [address] [place], [place] [address]     Coagulation Studies: No results for input(s): LABPROT, INR in the last 72 hours.   Urinalysis: No results for input(s): COLORURINE, LABSPEC, PHURINE, GLUCOSEU, HGBUR, BILIRUBINUR, KETONESUR, PROTEINUR, UROBILINOGEN, NITRITE, LEUKOCYTESUR in the last 72 hours.  Invalid  input(s): APPERANCEUR     Imaging: No results found.   Medications:      amLODipine  10 mg Oral Daily   carvedilol  12.5 mg Oral BID WC   Chlorhexidine Gluconate Cloth  6 each Topical Daily   cholecalciferol  1,000 Units Oral Daily   furosemide  60 mg Intravenous BID   heparin injection (subcutaneous)  5,000 Units Subcutaneous Q8H   insulin aspart  0-15 Units Subcutaneous TID WC   multivitamin with minerals  1 tablet Oral Daily   omega-3 acid ethyl esters  1 g Oral Daily   acetaminophen **OR** acetaminophen, magnesium hydroxide, morphine injection, nitroGLYCERIN, ondansetron, traZODone  Assessment/ Plan:  Ms. Julie [name] is a 75 y.o.  female with past medical history of chronic diastolic heart failure, chronic respiratory failure on 2 L oxygen at home, diabetes, hypertension, dyslipidemia, osteoporosis, and CKD stage IIIb.  Patient presents to the emergency room with altered mental status and worsening shortness of breath.  She will be admitted for Elevated liver function tests [R79.89] NSTEMI (non-ST elevated myocardial infarction) ([place]) [I21.4] AKI (acute kidney injury) ([place]) [N17.9]  Acute Kidney Injury on chronic kidney disease stage 3b with baseline creatinine 1.74 and GFR of 31 on 08/15/21.  Acute kidney injury likely secondary to cardiorenal syndrome Lisinopril held.  No IV contrast exposure.  Renal ultrasound on 09/20/2021 Unremarkable.  No acute need for dialysis at time. -Continue diuresis with Lasix 60 mg twice daily.  Will transition to oral dosing for discharge Will schedule follow up with our office at discharge  Lab Results  Component Value Date   CREATININE 2.34 (H) 09/25/2021   CREATININE 2.56 (H) 09/24/2021   CREATININE 3.22 (H) 09/23/2021     Intake/Output Summary (Last 24 hours) at 09/25/2021 1229 Last data filed at 09/25/2021 0906 Gross per 24 hour  Intake 240 ml  Output 800 ml  Net -560 ml     2. Anemia of chronic kidney disease:  Normocytic Lab Results  Component Value Date   HGB 9.0 (L) 09/21/2021  Hemoglobin remains within acceptable target.  We will continue to monitor  3. Secondary Hyperparathyroidism: with outpatient labs: PTH 84, phosphorus 4.6, calcium 8.9 on 08/15/21.   Lab Results  Component Value Date   CALCIUM 8.7 (L) 09/25/2021    4.  Hypertension with chronic kidney disease.  Home regimen includes amlodipine, carvedilol, furosemide, and lisinopril.  Lisinopril currently on hold.   Blood pressure currently 117/51  5. Diabetes mellitus type II with  chronic kidney disease Lab Results  Component Value Date   HGBA1C 5.4 07/24/2021  Glucose elevated this a.m. Primary team to manage sliding scale insulin.     LOS: 6 Orissa Arreaga 12/12/202212:29 PM

## 2021-09-25 NOTE — Progress Notes (Signed)
PT Cancellation Note  Patient Details Name: Julie [name] MRN: [id number] DOB: Nov 12, 1945   Cancelled Treatment:    Reason Eval/Treat Not Completed: Other (comment)  Discharge orders in.  Pt stated she has been walking in room on her own with walker and stated she feels stronger with no mobility concerns.  Declined therapy interventions before discharge.  She does request a "wide" RW and it is relayed to RN.   Chesley Noon 09/25/2021, 10:10 AM

## 2021-09-25 NOTE — Discharge Summary (Signed)
Physician Discharge Summary  LADELL BEY GGY:[id number] DOB: 1946/01/06 DOA: 09/19/2021  PCP: Gladstone Lighter, MD  Admit date: 09/19/2021 Discharge date: 09/25/2021  Discharge disposition: Home with home health therapy (home health therapy to begin on 09/28/2021)   Recommendations for Outpatient Follow-Up:   Follow-up with PCP in 1 week Follow-up with nephrologist as scheduled (office will call to schedule appointment) Follow-up with Dr. Nehemiah Massed, cardiologist, on 10/05/2021  Discharge Diagnosis:   Principal Problem:   Demand ischemia [facility]) Active Problems:   Acute on chronic respiratory failure with hypoxia ([place])   Acute on chronic diastolic CHF (congestive heart failure) ([place])    Discharge Condition: Stable.  Diet recommendation:  Diet Order             Diet - low sodium heart healthy           Diet Carb Modified           Diet Heart Room service appropriate? Yes; Fluid consistency: Thin; Fluid restriction: 1200 mL Fluid  Diet effective now                     Code Status: DNR     Hospital Course:   Ms. MARIGOLD MOM is a 75 y.o. female with medical history significant for chronic diastolic CHF, stage IIIb CKD, chronic hypoxemic respiratory failure on 2 L/min oxygen at home, type II DM, hypertension, dyslipidemia, osteoporosis.  She presented to the hospital because of altered mental status shortness of breath that is worse with exertion, orthopnea and lower extremity edema.  History is significant for a fall at home about 2 days prior to admission.  Reportedly oxygen saturation was 68% on room air when EMS picked her up.  Her troponins evaluated ([phone]).   She was admitted to the hospital for acute on chronic diastolic CHF, acute on chronic hypoxemic respiratory failure and acute metabolic encephalopathy.  Elevated troponins were attributed to demand ischemia per cardiologist.  She was also found to have AKI and elevated liver enzymes  probably from acute CHF.  She was treated with IV Lasix, fluid restriction and oxygen via nasal cannula.  She was also treated with antibiotics for suspected left lower lobe pneumonia.  Her condition has improved significantly and her mental status is back to baseline.  She was seen by physical therapist who recommended home health PT.      Medical Consultants:   Cardiologist Nephrologist   Discharge Exam:    Vitals:   09/24/21 1939 09/24/21 2342 09/25/21 0454 09/25/21 0821  BP: (!) 128/55 (!) 125/58 132/64 (!) 121/45  Pulse: 68 70 77 62  Resp: 17 16 18    Temp: 98.2 F (36.8 C) 97.9 F (36.6 C) 97.8 F (36.6 C) 97.9 F (36.6 C)  TempSrc:  Oral Oral   SpO2: 97% 96% 99% 98%  Weight:   124.5 kg   Height:         GEN: NAD SKIN: Warm and dry EYES: No pallor or icterus ENT: MMM CV: RRR PULM: CTA B ABD: soft, obese, NT, +BS CNS: AAO x 3, non focal EXT: Trace bilateral leg edema.  No erythema or tenderness   The results of significant diagnostics from this hospitalization (including imaging, microbiology, ancillary and laboratory) are listed below for reference.     Procedures and Diagnostic Studies:   US RENAL  Result Date: 09/20/2021 CLINICAL DATA:  Acute kidney insufficiency. EXAM: RENAL / URINARY TRACT ULTRASOUND COMPLETE COMPARISON:  None. FINDINGS: Right Kidney:  Renal measurements: 11.8 x 6.3 x 5.3 cm = volume: 203 mL. Echogenicity within normal limits. No mass or hydronephrosis visualized. Minimal perinephric free fluid is noted. Left Kidney: Renal measurements: 12.5 x 5.6 x 5.9 cm = volume: 216 mL. Echogenicity within normal limits. No mass or hydronephrosis visualized. Bladder: Appears normal for degree of bladder distention. Other: None. IMPRESSION: Unremarkable bilateral renal ultrasound Electronically Signed   By: San Morelle M.D.   On: 09/20/2021 15:27   ECHOCARDIOGRAM COMPLETE  Result Date: 09/20/2021    ECHOCARDIOGRAM REPORT   Patient Name:    KARALEE HAUTER Date of Exam: 09/20/2021 Medical Rec #:  [id number]         Height:       65.0 in Accession #:    [id number]        Weight:       300.0 lb Date of Birth:  24-Nov-1945         BSA:          2.350 m Patient Age:    66 years          BP:           116/52 mmHg Patient Gender: F                 HR:           64 bpm. Exam Location:  [facility] Procedure: 2D Echo, Color Doppler, Cardiac Doppler and Intracardiac            Opacification Agent Indications:     I21.4 NSTEMI  History:         Patient has no prior history of Echocardiogram examinations.                  CHF, CKD; Risk Factors:Hypertension, Dyslipidemia and Diabetes.  Sonographer:     Charmayne Sheer Referring Phys:  [id number] [place] Diagnosing Phys: Serafina Royals MD  Sonographer Comments: Technically difficult study due to poor echo windows. Image acquisition challenging due to patient body habitus. IMPRESSIONS  1. Left ventricular ejection fraction, by estimation, is 65 to 70%. The left ventricle has normal function. The left ventricle has no regional wall motion abnormalities. Left ventricular diastolic parameters were normal.  2. Right ventricular systolic function is normal. The right ventricular size is normal.  3. The mitral valve is normal in structure. Mild mitral valve regurgitation.  4. The aortic valve is normal in structure. Aortic valve regurgitation is not visualized. FINDINGS  Left Ventricle: Left ventricular ejection fraction, by estimation, is 65 to 70%. The left ventricle has normal function. The left ventricle has no regional wall motion abnormalities. Definity contrast agent was given IV to delineate the left ventricular  endocardial borders. The left ventricular internal cavity size was small. There is no left ventricular hypertrophy. Left ventricular diastolic parameters were normal. Right Ventricle: The right ventricular size is normal. No increase in right ventricular wall thickness. Right ventricular systolic function is normal.  Left Atrium: Left atrial size was normal in size. Right Atrium: Right atrial size was normal in size. Pericardium: There is no evidence of pericardial effusion. Mitral Valve: The mitral valve is normal in structure. Mild mitral valve regurgitation. MV peak gradient, 6.0 mmHg. The mean mitral valve gradient is 2.0 mmHg. Tricuspid Valve: The tricuspid valve is normal in structure. Tricuspid valve regurgitation is trivial. Aortic Valve: The aortic valve is normal in structure. Aortic valve regurgitation is not visualized. Aortic valve mean gradient measures 8.0 mmHg. Aortic  valve peak gradient measures 12.5 mmHg. Aortic valve area, by VTI measures 1.55 cm. Pulmonic Valve: The pulmonic valve was normal in structure. Pulmonic valve regurgitation is not visualized. Aorta: The aortic root and ascending aorta are structurally normal, with no evidence of dilitation. IAS/Shunts: No atrial level shunt detected by color flow Doppler.  LEFT VENTRICLE PLAX 2D LVIDd:         4.55 cm   Diastology LVIDs:         3.38 cm   LV e' medial:    9.79 cm/s LV PW:         1.27 cm   LV E/e' medial:  10.3 LV IVS:        0.81 cm   LV e' lateral:   7.29 cm/s LVOT diam:     1.90 cm   LV E/e' lateral: 13.9 LV SV:         55 LV SV Index:   23 LVOT Area:     2.84 cm  RIGHT VENTRICLE RV Basal diam:  4.94 cm RV Mid diam:    5.31 cm LEFT ATRIUM             Index        RIGHT ATRIUM           Index LA diam:        4.30 cm 1.83 cm/m   RA Area:     23.70 cm LA Vol (A2C):   72.9 ml 31.02 ml/m  RA Volume:   78.10 ml  33.23 ml/m LA Vol (A4C):   65.1 ml 27.70 ml/m LA Biplane Vol: 70.7 ml 30.08 ml/m  AORTIC VALVE                     PULMONIC VALVE AV Area (Vmax):    1.47 cm      PV Vmax:       0.81 m/s AV Area (Vmean):   1.38 cm      PV Vmean:      60.700 cm/s AV Area (VTI):     1.55 cm      PV VTI:        0.176 m AV Vmax:           177.00 cm/s   PV Peak grad:  2.6 mmHg AV Vmean:          130.000 cm/s  PV Mean grad:  2.0 mmHg AV VTI:             0.355 m AV Peak Grad:      12.5 mmHg AV Mean Grad:      8.0 mmHg LVOT Vmax:         92.00 cm/s LVOT Vmean:        63.400 cm/s LVOT VTI:          0.194 m LVOT/AV VTI ratio: 0.55  AORTA Ao Root diam: 2.80 cm MITRAL VALVE MV Area (PHT): 2.48 cm     SHUNTS MV Area VTI:   1.41 cm     Systemic VTI:  0.19 m MV Peak grad:  6.0 mmHg     Systemic Diam: 1.90 cm MV Mean grad:  2.0 mmHg MV Vmax:       1.22 m/s MV Vmean:      69.2 cm/s MV Decel Time: 306 msec MV E velocity: 101.00 cm/s MV A velocity: 115.00 cm/s MV E/A ratio:  0.88 Serafina Royals MD Electronically signed by Serafina Royals MD Signature Date/Time: 09/20/2021/12:07:16  PM    Final      Labs:   Basic Metabolic Panel: Recent Labs  Lab 09/21/21 0452 09/22/21 0559 09/23/21 0846 09/24/21 0509 09/25/21 0304  NA 128* 133* 133* 135 135  K 4.8 4.9 4.7 4.5 4.7  CL 97* 97* 98 98 99  CO2 23 26 26 27 29   GLUCOSE 125* 163* 140* 150* 164*  BUN 79* 93* 91* 93* 87*  CREATININE 3.95* 3.81* 3.22* 2.56* 2.34*  CALCIUM 7.7* 8.1* 8.3* 8.5* 8.7*   GFR Estimated Creatinine Clearance: 28.4 mL/min (A) (by C-G formula based on SCr of 2.34 mg/dL (H)). Liver Function Tests: Recent Labs  Lab 09/19/21 1859 09/20/21 0451 09/21/21 0452 09/22/21 0559 09/24/21 0509  AST 874* 485* 173* 66* 27  ALT 1,399* 1,046* 706* 515* 244*  ALKPHOS 77 65 59 57 60  BILITOT 1.1 0.9 0.5 0.7 0.8  PROT 6.7 5.9* 5.5* 5.7* 5.4*  ALBUMIN 3.6 3.1* 2.8* 2.9* 2.9*   No results for input(s): LIPASE, AMYLASE in the last 168 hours. No results for input(s): AMMONIA in the last 168 hours. Coagulation profile Recent Labs  Lab 09/19/21 1859  INR 1.3*    CBC: Recent Labs  Lab 09/19/21 1859 09/20/21 0451 09/21/21 0452  WBC 11.1* 9.8 6.5  NEUTROABS 9.7*  --   --   HGB 10.6* 9.7* 9.0*  HCT 34.9* 31.5* 29.5*  MCV 82.1 81.4 79.1*  PLT 145* 143* 128*   Cardiac Enzymes: Recent Labs  Lab 09/19/21 1859  CKTOTAL 487*   BNP: Invalid input(s): POCBNP CBG: Recent Labs  Lab  09/24/21 0814 09/24/21 1208 09/24/21 1548 09/24/21 2103 09/25/21 0822  GLUCAP 155* 258* 214* 167* 169*   D-Dimer No results for input(s): DDIMER in the last 72 hours. Hgb A1c No results for input(s): HGBA1C in the last 72 hours. Lipid Profile No results for input(s): CHOL, HDL, LDLCALC, TRIG, CHOLHDL, LDLDIRECT in the last 72 hours. Thyroid function studies No results for input(s): TSH, T4TOTAL, T3FREE, THYROIDAB in the last 72 hours.  Invalid input(s): FREET3 Anemia work up No results for input(s): VITAMINB12, FOLATE, FERRITIN, TIBC, IRON, RETICCTPCT in the last 72 hours. Microbiology Recent Results (from the past 240 hour(s))  Resp Panel by RT-PCR (Flu A&B, Covid) Nasopharyngeal Swab     Status: None   Collection Time: 09/16/21  7:45 PM   Specimen: Nasopharyngeal Swab; Nasopharyngeal(NP) swabs in vial transport medium  Result Value Ref Range Status   SARS Coronavirus 2 by RT PCR NEGATIVE NEGATIVE Final    Comment: (NOTE) SARS-CoV-2 target nucleic acids are NOT DETECTED.  The SARS-CoV-2 RNA is generally detectable in upper respiratory specimens during the acute phase of infection. The lowest concentration of SARS-CoV-2 viral copies this assay can detect is 138 copies/mL. A negative result does not preclude SARS-Cov-2 infection and should not be used as the sole basis for treatment or other patient management decisions. A negative result may occur with  improper specimen collection/handling, submission of specimen other than nasopharyngeal swab, presence of viral mutation(s) within the areas targeted by this assay, and inadequate number of viral copies(<138 copies/mL). A negative result must be combined with clinical observations, patient history, and epidemiological information. The expected result is Negative.  Fact Sheet for Patients:  EntrepreneurPulse.com.au  Fact Sheet for Healthcare Providers:  IncredibleEmployment.be  This  test is no t yet approved or cleared by the [place] FDA and  has been authorized for detection and/or diagnosis of SARS-CoV-2 by FDA under an Emergency Use Authorization (EUA). This  EUA will remain  in effect (meaning this test can be used) for the duration of the COVID-19 declaration under Section 564(b)(1) of the Act, 21 U.S.C.section 360bbb-3(b)(1), unless the authorization is terminated  or revoked sooner.       Influenza A by PCR NEGATIVE NEGATIVE Final   Influenza B by PCR NEGATIVE NEGATIVE Final    Comment: (NOTE) The Xpert Xpress SARS-CoV-2/FLU/RSV plus assay is intended as an aid in the diagnosis of influenza from Nasopharyngeal swab specimens and should not be used as a sole basis for treatment. Nasal washings and aspirates are unacceptable for Xpert Xpress SARS-CoV-2/FLU/RSV testing.  Fact Sheet for Patients: EntrepreneurPulse.com.au  Fact Sheet for Healthcare Providers: IncredibleEmployment.be  This test is not yet approved or cleared by the [place] FDA and has been authorized for detection and/or diagnosis of SARS-CoV-2 by FDA under an Emergency Use Authorization (EUA). This EUA will remain in effect (meaning this test can be used) for the duration of the COVID-19 declaration under Section 564(b)(1) of the Act, 21 U.S.C. section 360bbb-3(b)(1), unless the authorization is terminated or revoked.  Performed at [facility], [address] [place], [place] [address]   Resp Panel by RT-PCR (Flu A&B, Covid) Nasopharyngeal Swab     Status: None   Collection Time: 09/19/21  6:59 PM   Specimen: Nasopharyngeal Swab; Nasopharyngeal(NP) swabs in vial transport medium  Result Value Ref Range Status   SARS Coronavirus 2 by RT PCR NEGATIVE NEGATIVE Final    Comment: (NOTE) SARS-CoV-2 target nucleic acids are NOT DETECTED.  The SARS-CoV-2 RNA is generally detectable in upper respiratory specimens during the acute  phase of infection. The lowest concentration of SARS-CoV-2 viral copies this assay can detect is 138 copies/mL. A negative result does not preclude SARS-Cov-2 infection and should not be used as the sole basis for treatment or other patient management decisions. A negative result may occur with  improper specimen collection/handling, submission of specimen other than nasopharyngeal swab, presence of viral mutation(s) within the areas targeted by this assay, and inadequate number of viral copies(<138 copies/mL). A negative result must be combined with clinical observations, patient history, and epidemiological information. The expected result is Negative.  Fact Sheet for Patients:  EntrepreneurPulse.com.au  Fact Sheet for Healthcare Providers:  IncredibleEmployment.be  This test is no t yet approved or cleared by the [place] FDA and  has been authorized for detection and/or diagnosis of SARS-CoV-2 by FDA under an Emergency Use Authorization (EUA). This EUA will remain  in effect (meaning this test can be used) for the duration of the COVID-19 declaration under Section 564(b)(1) of the Act, 21 U.S.C.section 360bbb-3(b)(1), unless the authorization is terminated  or revoked sooner.       Influenza A by PCR NEGATIVE NEGATIVE Final   Influenza B by PCR NEGATIVE NEGATIVE Final    Comment: (NOTE) The Xpert Xpress SARS-CoV-2/FLU/RSV plus assay is intended as an aid in the diagnosis of influenza from Nasopharyngeal swab specimens and should not be used as a sole basis for treatment. Nasal washings and aspirates are unacceptable for Xpert Xpress SARS-CoV-2/FLU/RSV testing.  Fact Sheet for Patients: EntrepreneurPulse.com.au  Fact Sheet for Healthcare Providers: IncredibleEmployment.be  This test is not yet approved or cleared by the [place] FDA and has been authorized for detection and/or diagnosis of  SARS-CoV-2 by FDA under an Emergency Use Authorization (EUA). This EUA will remain in effect (meaning this test can be used) for the duration of the COVID-19 declaration under Section 564(b)(1) of  the Act, 21 U.S.C. section 360bbb-3(b)(1), unless the authorization is terminated or revoked.  Performed at [facility], [address] [place], [place] [address]   Blood culture (routine x 2)     Status: None   Collection Time: 09/19/21  8:43 PM   Specimen: BLOOD  Result Value Ref Range Status   Specimen Description BLOOD RAC  Final   Special Requests   Final    BOTTLES DRAWN AEROBIC AND ANAEROBIC Blood Culture adequate volume   Culture   Final    NO GROWTH 5 DAYS Performed at [facility], [address] [place], [place] [address]    Report Status 09/24/2021 FINAL  Final  Blood culture (routine x 2)     Status: Abnormal   Collection Time: 09/19/21  8:43 PM   Specimen: BLOOD  Result Value Ref Range Status   Specimen Description   Final    BLOOD LAC Performed at [facility], [address], [place], [place] [address]    Special Requests   Final    BOTTLES DRAWN AEROBIC AND ANAEROBIC Blood Culture adequate volume Performed at [facility], [address] [place], [place] [address]    Culture  Setup Time   Final    Organism ID to follow [place] IN BOTH AEROBIC AND ANAEROBIC BOTTLES CRITICAL RESULT CALLED TO, READ BACK BY AND VERIFIED WITH: SUSAN WATSON 1506 09/20/21 MU Performed at [facility], [address] [place],  [address]    Culture (A)  Final    STAPHYLOCOCCUS LUGDUNENSIS STAPHYLOCOCCUS EPIDERMIDIS THE SIGNIFICANCE OF ISOLATING THIS ORGANISM FROM A SINGLE SET OF BLOOD CULTURES WHEN MULTIPLE SETS ARE DRAWN IS UNCERTAIN. PLEASE NOTIFY THE MICROBIOLOGY DEPARTMENT WITHIN ONE WEEK IF SPECIATION AND SENSITIVITIES ARE REQUIRED. Performed at [facility], [place] [address], [place],  [address]     Report Status 09/23/2021 FINAL  Final   Organism ID, Bacteria STAPHYLOCOCCUS LUGDUNENSIS  Final      Susceptibility   Staphylococcus lugdunensis - MIC*    CIPROFLOXACIN <=0.5 SENSITIVE Sensitive     ERYTHROMYCIN <=0.25 SENSITIVE Sensitive     GENTAMICIN <=0.5 SENSITIVE Sensitive     OXACILLIN 0.5 SENSITIVE Sensitive     TETRACYCLINE <=1 SENSITIVE Sensitive     VANCOMYCIN <=0.5 SENSITIVE Sensitive     TRIMETH/SULFA <=10 SENSITIVE Sensitive     CLINDAMYCIN <=0.25 SENSITIVE Sensitive     RIFAMPIN <=0.5 SENSITIVE Sensitive     Inducible Clindamycin NEGATIVE Sensitive     * STAPHYLOCOCCUS LUGDUNENSIS  Blood Culture ID Panel (Reflexed)     Status: Abnormal   Collection Time: 09/19/21  8:43 PM  Result Value Ref Range Status   Enterococcus faecalis NOT DETECTED NOT DETECTED Final   Enterococcus Faecium NOT DETECTED NOT DETECTED Final   Listeria monocytogenes NOT DETECTED NOT DETECTED Final   Staphylococcus species DETECTED (A) NOT DETECTED Final    Comment: CRITICAL RESULT CALLED TO, READ BACK BY AND VERIFIED WITH: SUSAN WATSON 1506 09/20/21 MU    Staphylococcus aureus (BCID) NOT DETECTED NOT DETECTED Final   Staphylococcus epidermidis DETECTED (A) NOT DETECTED Final    Comment: Methicillin (oxacillin) resistant coagulase negative staphylococcus. Possible blood culture contaminant (unless isolated from more than one blood culture draw or clinical case suggests pathogenicity). No antibiotic treatment is indicated for blood  culture contaminants. CRITICAL RESULT CALLED TO, READ BACK BY AND VERIFIED WITH: [place] 1506 09/20/21 MU    Staphylococcus lugdunensis DETECTED (A) NOT DETECTED Final  Comment: Methicillin (oxacillin) resistant coagulase negative staphylococcus. Possible blood culture contaminant (unless isolated from more than one blood culture draw or clinical case suggests pathogenicity). No antibiotic treatment is indicated for blood  culture contaminants. CRITICAL RESULT  CALLED TO, READ BACK BY AND VERIFIED WITH: [place] [id number] 09/20/21 MU    Streptococcus species NOT DETECTED NOT DETECTED Final   Streptococcus agalactiae NOT DETECTED NOT DETECTED Final   Streptococcus pneumoniae NOT DETECTED NOT DETECTED Final   Streptococcus pyogenes NOT DETECTED NOT DETECTED Final   A.calcoaceticus-baumannii NOT DETECTED NOT DETECTED Final   Bacteroides fragilis NOT DETECTED NOT DETECTED Final   Enterobacterales NOT DETECTED NOT DETECTED Final   Enterobacter cloacae complex NOT DETECTED NOT DETECTED Final   Escherichia coli NOT DETECTED NOT DETECTED Final   Klebsiella aerogenes NOT DETECTED NOT DETECTED Final   Klebsiella oxytoca NOT DETECTED NOT DETECTED Final   Klebsiella pneumoniae NOT DETECTED NOT DETECTED Final   Proteus species NOT DETECTED NOT DETECTED Final   Salmonella species NOT DETECTED NOT DETECTED Final   Serratia marcescens NOT DETECTED NOT DETECTED Final   Haemophilus influenzae NOT DETECTED NOT DETECTED Final   Neisseria meningitidis NOT DETECTED NOT DETECTED Final   Pseudomonas aeruginosa NOT DETECTED NOT DETECTED Final   Stenotrophomonas maltophilia NOT DETECTED NOT DETECTED Final   Candida albicans NOT DETECTED NOT DETECTED Final   Candida auris NOT DETECTED NOT DETECTED Final   Candida glabrata NOT DETECTED NOT DETECTED Final   Candida krusei NOT DETECTED NOT DETECTED Final   Candida parapsilosis NOT DETECTED NOT DETECTED Final   Candida tropicalis NOT DETECTED NOT DETECTED Final   Cryptococcus neoformans/gattii NOT DETECTED NOT DETECTED Final   Methicillin resistance mecA/C DETECTED (A) NOT DETECTED Final    Comment: CRITICAL RESULT CALLED TO, READ BACK BY AND VERIFIED WITH: SUSAN WATSON 1506 09/20/21 MU Performed at [facility], [address], [place], [place] [address]      Discharge Instructions:   Discharge Instructions     Diet - low sodium heart healthy   Complete by: As directed    Diet Carb Modified   Complete  by: As directed    Discharge instructions   Complete by: As directed    Use 2L/min oxygen around the clock   Face-to-face encounter (required for Medicare/Medicaid patients)   Complete by: As directed    I Apollo Timothy certify that this patient is under my care and that I, or a nurse practitioner or physician's assistant working with me, had a face-to-face encounter that meets the physician face-to-face encounter requirements with this patient on 09/25/2021. The encounter with the patient was in whole, or in part for the following medical condition(s) which is the primary reason for home health care (List medical condition): Debility   The encounter with the patient was in whole, or in part, for the following medical condition, which is the primary reason for home health care: Debility   I certify that, based on my findings, the following services are medically necessary home health services: Physical therapy   Reason for Medically Necessary Home Health Services: Therapy- Personnel officer, Public librarian   My clinical findings support the need for the above services: Unsafe ambulation due to balance issues   Further, I certify that my clinical findings support that this patient is homebound due to: Unsafe ambulation due to balance issues   For home use only DME Other see comment   Complete by: As directed    Bariatric walker  Length of Need: Lifetime   Home Health   Complete by: As directed    To provide the following care/treatments: PT   Increase activity slowly   Complete by: As directed       Allergies as of 09/25/2021       Reactions   Nsaids Other (See Comments)   CKD   Penicillins Other (See Comments)   Not sure of reaction         Medication List     STOP taking these medications    ondansetron 4 MG tablet Commonly known as: Zofran   oxyCODONE-acetaminophen 5-325 MG tablet Commonly known as: PERCOCET/ROXICET       TAKE these medications     alendronate 70 MG tablet Commonly known as: FOSAMAX Take 1 tablet (70 mg total) by mouth once a week.   amLODipine 10 MG tablet Commonly known as: NORVASC TAKE 1 TABLET BY MOUTH  DAILY   atorvastatin 40 MG tablet Commonly known as: LIPITOR Take 0.5 tablets (20 mg total) by mouth at bedtime.   carvedilol 12.5 MG tablet Commonly known as: COREG Take 12.5 mg by mouth 2 (two) times daily with a meal.   Cholecalciferol 25 MCG (1000 UT) tablet Take 1,000 Units by mouth daily.   cyclobenzaprine 5 MG tablet Commonly known as: FLEXERIL Take 5 mg by mouth 4 (four) times daily as needed.   ferrous sulfate 325 (65 FE) MG tablet Take 1 tablet (325 mg total) by mouth daily.   Fish Oil 1000 MG Caps Take 1,000 mg by mouth 2 (two) times daily.   furosemide 40 MG tablet Commonly known as: LASIX Take 1 tablet (40 mg total) by mouth 2 (two) times daily.   glipiZIDE 5 MG 24 hr tablet Commonly known as: GLUCOTROL XL Take 1 tablet (5 mg total) by mouth daily with breakfast.   lisinopril 40 MG tablet Commonly known as: ZESTRIL Take 1 tablet (40 mg total) by mouth daily.   metolazone 2.5 MG tablet Commonly known as: ZAROXOLYN Take 1 tablet by mouth once a week.   Multi-Vitamins Tabs Take by mouth daily.   OneTouch Verio test strip Generic drug: glucose blood 1 each daily.               Durable Medical Equipment  (From admission, onward)           Start     Ordered   09/25/21 0000  For home use only DME Other see comment       Comments: Bariatric walker  Question:  Length of Need  Answer:  Lifetime   09/25/21 1102            Follow-up Information     Corey Skains, MD Follow up on 10/05/2021.   Specialty: Cardiology Why: @ 9am Contact information: [address] [facility] [place] [address] [phone]                   If you experience worsening of your admission symptoms, develop shortness of breath,  life threatening emergency, suicidal or homicidal thoughts you must seek medical attention immediately by calling 911 or calling your MD immediately  if symptoms less severe.   You must read complete instructions/literature along with all the possible adverse reactions/side effects for all the medicines you take and that have been prescribed to you. Take any new medicines after you have completely understood and accept all the possible adverse reactions/side effects.    Please note  You were cared for by a hospitalist during your hospital stay. If you have any questions about your discharge medications or the care you received while you were in the hospital after you are discharged, you can call the unit and asked to speak with the hospitalist on call if the hospitalist that took care of you is not available. Once you are discharged, your primary care physician will handle any further medical issues. Please note that NO REFILLS for any discharge medications will be authorized once you are discharged, as it is imperative that you return to your primary care physician (or establish a relationship with a primary care physician if you do not have one) for your aftercare needs so that they can reassess your need for medications and monitor your lab values.       Time coordinating discharge: 34 minutes  Signed:  Jaeceon Michelin  [facility] 09/25/2021, 11:02 AM   Pager on [email or web address]. If 7PM-7AM, please contact night-coverage at [email or web address]

## 2021-10-11 NOTE — Progress Notes (Signed)
Patient ID: Julie [name], female    DOB: 01/03/1946, 75 y.o.   MRN: [id number]  HPI  Ms Bazar is a 75 y/o female with a history of DM, hyperlipidemia, HTN, CKD, osteoporosis and chronic heart failure.   Echo report from 09/20/21 reviewed and showed an EF of 65-70% without structural changes. Echo report from 03/28/21 reviewed and showed an EF of >55% without LVH/LAE.   Admitted 09/19/21 due to AMS and worsening shortness of breath & edema. Previous fall 2 days prior. Cardiology consult obtained. Hypoxic initially. Elevated troponins thought to be due to demand ischemia. Given IV diuretics with transition to oral diuretics along with fluid restriction. Antibiotics given for possible pneumonia. PT evaluation done. Discharged after 6 days. Was in the ED 09/16/21 due to shortness of breath and left upper back pain. Recent mechanical fall. Admission recommended but patient declined as she's the sole caregiver of her husband who has alzheimer's. Admitted 07/24/21 due to acute on chronic hypoxic respiratory failure. Chest CT negative for PE. Given IV lasix with transition to oral diuretics. Cardiology consult obtained. Ferrous sulfate started due to anemia. Discharged after 3 days.   She presents today for a follow-up visit with a chief complaint of minimal shortness of breath upon moderate exertion. She describes this as chronic in nature having been present for several years although has improved over the last several week. She has associated fatigue, dry cough, rare chest pain, pedal edema (improving) and chronic difficulty sleeping along with this. She denies any dizziness, palpitations, abdominal distention or weight gain.   Now living with grandson who is monitoring her sodium intake more carefully and providing a better diet. She is wearing oxygen at 2L mostly around the clock. Is planning on talking with PCP next week about getting a smaller size to make it easier for her to get around. Is also  going to speak to PCP about a sleep study to rule out sleep apnea.   Past Medical History:  Diagnosis Date   Arthritis    Cataract    CHF (congestive heart failure) (HCC)    CKD (chronic kidney disease) stage 2, GFR 60-89 ml/min    Diabetes mellitus without complication ([place])    Hyperlipidemia    Hypertension    Morbid obesity ([place])    Osteoporosis 12/13/2013   Proteinuria 10/15/2013   referred to Nephrology   Past Surgical History:  Procedure Laterality Date   COLONOSCOPY WITH PROPOFOL N/A 07/16/2017   Procedure: COLONOSCOPY WITH PROPOFOL;  Surgeon: Robert Bellow, MD;  Location: [facility] ENDOSCOPY;  Service: Endoscopy;  Laterality: N/A;   [place] OF UTERUS  2007   ESOPHAGOGASTRODUODENOSCOPY (EGD) WITH PROPOFOL N/A 07/16/2017   Procedure: ESOPHAGOGASTRODUODENOSCOPY (EGD) WITH PROPOFOL;  Surgeon: Robert Bellow, MD;  Location: [facility] ENDOSCOPY;  Service: Endoscopy;  Laterality: N/A;   MOUTH SURGERY  2007   cyst   TONSILLECTOMY     Family History  Problem Relation Age of Onset   Glaucoma Mother    Cancer Father        liver   Cancer Sister        lung, bone   COPD Sister    Heart disease Maternal Grandmother        chf   Hypertension Brother    Hypertension Maternal Uncle    Ovarian cancer Paternal Grandmother    Cancer Paternal Grandmother        ovarian cancer   Breast cancer Neg Hx    Social  History   Tobacco Use   Smoking status: Never   Smokeless tobacco: Never   Tobacco comments:    smoking cessation materials not required  Substance Use Topics   Alcohol use: No   Allergies  Allergen Reactions   Nsaids Other (See Comments)    CKD   Penicillins Other (See Comments)    Not sure of reaction    Prior to Admission medications   Medication Sig Start Date End Date Taking? Authorizing Provider  amLODipine (NORVASC) 10 MG tablet TAKE 1 TABLET BY MOUTH  DAILY 09/12/20  Yes Delsa Grana, PA-C  atorvastatin (LIPITOR) 40 MG tablet Take 0.5  tablets (20 mg total) by mouth at bedtime. 06/20/20  Yes Delsa Grana, PA-C  carvedilol (COREG) 12.5 MG tablet Take 12.5 mg by mouth 2 (two) times daily with a meal. 04/13/21 04/13/22 Yes [provider]  Cholecalciferol 25 MCG (1000 UT) tablet Take 1,000 Units by mouth daily.   Yes [provider]  cyclobenzaprine (FLEXERIL) 5 MG tablet Take 5 mg by mouth 4 (four) times daily as needed. 09/08/21  Yes [provider]  furosemide (LASIX) 40 MG tablet Take 1 tablet (40 mg total) by mouth 2 (two) times daily. 07/27/21  Yes Wieting, Richard, MD  glipiZIDE (GLUCOTROL XL) 5 MG 24 hr tablet Take 1 tablet (5 mg total) by mouth daily with breakfast. 07/27/21  Yes Wieting, Richard, MD  lisinopril (ZESTRIL) 40 MG tablet Take 1 tablet (40 mg total) by mouth daily. 05/17/20  Yes Delsa Grana, PA-C  Multiple Vitamin (MULTI-VITAMINS) TABS Take by mouth daily.    Yes [provider]  Omega-3 Fatty Acids (FISH OIL) 1000 MG CAPS Take 1,000 mg by mouth 2 (two) times daily.   Yes [provider]  [facility] VERIO test strip 1 each daily. 03/26/20  Yes [provider]  alendronate (FOSAMAX) 70 MG tablet Take 1 tablet (70 mg total) by mouth once a week. Patient not taking: Reported on 10/12/2021 05/13/20   Delsa Grana, PA-C  ferrous sulfate 325 (65 FE) MG tablet Take 1 tablet (325 mg total) by mouth daily. Patient not taking: Reported on 10/12/2021 07/27/21 09/19/21  Loletha Grayer, MD  metolazone (ZAROXOLYN) 2.5 MG tablet Take 1 tablet by mouth once a week. Patient not taking: Reported on 10/12/2021 08/02/21   [provider]   Review of Systems  Constitutional:  Positive for fatigue. Negative for appetite change.  HENT:  Negative for congestion, postnasal drip and sore throat.   Respiratory:  Positive for cough (dry) and shortness of breath (with moderate exertion). Negative for chest tightness.   Cardiovascular:  Positive for chest pain (upper chest wall) and  leg swelling (improving). Negative for palpitations.  Gastrointestinal:  Negative for abdominal distention, abdominal pain and blood in stool.  Endocrine: Negative.   Genitourinary: Negative.   Musculoskeletal:  Negative for back pain and neck pain.  Skin: Negative.   Allergic/Immunologic: Negative.   Neurological:  Negative for dizziness and light-headedness.  Hematological:  Negative for adenopathy. Bruises/bleeds easily.  Psychiatric/Behavioral:  Positive for sleep disturbance (sleeping in recliner for comfort). Negative for dysphoric mood. The patient is not nervous/anxious.    Vitals:   10/12/21 1110  BP: (!) 151/61  Pulse: 76  Resp: 18  SpO2: 98%  Weight: 254 lb 8 oz (115.4 kg)  Height: 5\' 4"  (1.626 m)   Wt Readings from Last 3 Encounters:  10/12/21 254 lb 8 oz (115.4 kg)  09/25/21 274 lb 7.6 oz (124.5 kg)  09/16/21 271 lb 2.7 oz (123 kg)   Lab Results  Component Value Date   CREATININE 2.34 (H) 09/25/2021   CREATININE 2.56 (H) 09/24/2021   CREATININE 3.22 (H) 09/23/2021   Physical Exam Vitals and nursing note reviewed.  Constitutional:      Appearance: She is well-developed.  HENT:     Head: Normocephalic and atraumatic.  Cardiovascular:     Rate and Rhythm: Normal rate and regular rhythm.  Pulmonary:     Effort: Pulmonary effort is normal.     Breath sounds: No wheezing, rhonchi or rales.  Abdominal:     Palpations: Abdomen is soft.     Tenderness: There is no abdominal tenderness.  Musculoskeletal:     Cervical back: Normal range of motion and neck supple.     Right lower leg: No tenderness. Edema (1+ pitting) present.     Left lower leg: No tenderness. Edema (1+ pitting) present.  Skin:    General: Skin is warm and dry.  Neurological:     General: No focal deficit present.     Mental Status: She is alert and oriented to person, place, and time.  Psychiatric:        Mood and Affect: Mood normal.        Behavior: Behavior normal.   Assessment &  Plan:  1: Chronic heart failure with preserved ejection fraction without structural changes- - NYHA class II - euvolemic today - weighing daily; instructed to call for an overnight weight gain of > 2 pounds or a weekly weight gain of > 5 pounds - weight down 16 pounds from last visit here 6 weeks ago - not adding salt to her food; reviewed the importance of following a low sodium diet; now living with grandson so is eating a more balanced diet - trying to keep daily fluid intake to ~ 40 ounces - saw cardiology Nehemiah Massed) 10/05/21 - wearing oxygen at 2L mostly around the clock - going to speak with PCP next week about getting a sleep study ordered to rule out sleep apnea - BNP 09/19/21 was 848.5  2: HTN with CKD- - BP mildly elevated (151/61) but she hasn't taken any of her medications yet today - saw PCP Tressia Miners) 08/02/21 returns next week - saw nephrology Holley Raring) 08/15/21 - BMP 09/25/21 reviewed and showed sodium 135, potassium 4.7, creatinine 2.34 and GFR 21  3: DM- - saw endocrinology Honor Junes) 04/13/21 - A1c 07/24/21 was 5.4% - checks her glucose at home twice a week  4: Lymphedema- - stage 2 - wears compression socks on occasion although edema has improved - encouraged her to elevate her legs when sitting for long periods of time - consider lymphapress compression boots if edema perists   Medication bottles reviewed.   Return in 3 months or sooner for any questions/problems before then.

## 2021-10-12 ENCOUNTER — Encounter: Payer: Self-pay | Admitting: Family

## 2021-10-12 ENCOUNTER — Other Ambulatory Visit: Payer: Self-pay

## 2021-10-12 ENCOUNTER — Ambulatory Visit: Payer: Medicare Other | Attending: Family | Admitting: Family

## 2021-10-12 VITALS — BP 151/61 | HR 76 | Resp 18 | Ht 64.0 in | Wt 254.5 lb

## 2021-10-12 DIAGNOSIS — I890 Lymphedema, not elsewhere classified: Secondary | ICD-10-CM | POA: Diagnosis not present

## 2021-10-12 DIAGNOSIS — R059 Cough, unspecified: Secondary | ICD-10-CM | POA: Insufficient documentation

## 2021-10-12 DIAGNOSIS — E785 Hyperlipidemia, unspecified: Secondary | ICD-10-CM | POA: Insufficient documentation

## 2021-10-12 DIAGNOSIS — Z79899 Other long term (current) drug therapy: Secondary | ICD-10-CM | POA: Diagnosis not present

## 2021-10-12 DIAGNOSIS — M810 Age-related osteoporosis without current pathological fracture: Secondary | ICD-10-CM | POA: Insufficient documentation

## 2021-10-12 DIAGNOSIS — Z9981 Dependence on supplemental oxygen: Secondary | ICD-10-CM | POA: Diagnosis not present

## 2021-10-12 DIAGNOSIS — I130 Hypertensive heart and chronic kidney disease with heart failure and stage 1 through stage 4 chronic kidney disease, or unspecified chronic kidney disease: Secondary | ICD-10-CM | POA: Insufficient documentation

## 2021-10-12 DIAGNOSIS — I10 Essential (primary) hypertension: Secondary | ICD-10-CM | POA: Diagnosis not present

## 2021-10-12 DIAGNOSIS — N1832 Chronic kidney disease, stage 3b: Secondary | ICD-10-CM

## 2021-10-12 DIAGNOSIS — D649 Anemia, unspecified: Secondary | ICD-10-CM | POA: Diagnosis not present

## 2021-10-12 DIAGNOSIS — I5032 Chronic diastolic (congestive) heart failure: Secondary | ICD-10-CM

## 2021-10-12 DIAGNOSIS — N182 Chronic kidney disease, stage 2 (mild): Secondary | ICD-10-CM | POA: Diagnosis not present

## 2021-10-12 DIAGNOSIS — E1122 Type 2 diabetes mellitus with diabetic chronic kidney disease: Secondary | ICD-10-CM

## 2021-10-12 NOTE — Patient Instructions (Signed)
Continue weighing daily and call for an overnight weight gain of 3 pounds or more or a weekly weight gain of more than 5 pounds.  °

## 2021-12-20 ENCOUNTER — Other Ambulatory Visit: Payer: Self-pay | Admitting: Family Medicine

## 2021-12-20 DIAGNOSIS — E782 Mixed hyperlipidemia: Secondary | ICD-10-CM

## 2022-01-08 ENCOUNTER — Ambulatory Visit: Payer: Medicare Other | Admitting: Family
